# Patient Record
Sex: Female | Born: 1995 | Hispanic: Refuse to answer | Marital: Single
Health system: Midwestern US, Community
[De-identification: ages and names within clinical notes are randomized; demographics above are authoritative.]

## PROBLEM LIST (undated history)

## (undated) DIAGNOSIS — E119 Type 2 diabetes mellitus without complications: Secondary | ICD-10-CM

## (undated) DIAGNOSIS — Z01818 Encounter for other preprocedural examination: Secondary | ICD-10-CM

## (undated) DIAGNOSIS — G8918 Other acute postprocedural pain: Secondary | ICD-10-CM

## (undated) DIAGNOSIS — I10 Essential (primary) hypertension: Secondary | ICD-10-CM

## (undated) DIAGNOSIS — J45909 Unspecified asthma, uncomplicated: Secondary | ICD-10-CM

## (undated) DIAGNOSIS — R519 Headache, unspecified: Secondary | ICD-10-CM

## (undated) DIAGNOSIS — E282 Polycystic ovarian syndrome: Secondary | ICD-10-CM

## (undated) DIAGNOSIS — E669 Obesity, unspecified: Secondary | ICD-10-CM

## (undated) DIAGNOSIS — F32A Depression, unspecified: Secondary | ICD-10-CM

## (undated) DIAGNOSIS — N390 Urinary tract infection, site not specified: Secondary | ICD-10-CM

## (undated) DIAGNOSIS — R51 Headache: Secondary | ICD-10-CM

## (undated) DIAGNOSIS — Z3490 Encounter for supervision of normal pregnancy, unspecified, unspecified trimester: Secondary | ICD-10-CM

## (undated) HISTORY — PX: TYMPANOSTOMY TUBE PLACEMENT: SHX32

## (undated) HISTORY — PX: OTHER SURGICAL HISTORY: SHX169

## (undated) HISTORY — PX: ABDOMINAL EXPLORATION SURGERY: SHX538

## (undated) HISTORY — DX: Polycystic ovarian syndrome: E28.2

## (undated) HISTORY — DX: Type 2 diabetes mellitus without complications: E11.9

## (undated) HISTORY — PX: LAPAROSCOPIC GASTRIC SLEEVE RESECTION: SHX5895

---
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
## 2018-01-05 ENCOUNTER — Ambulatory Visit
Admit: 2018-01-05 | Discharge: 2018-01-05 | Payer: PRIVATE HEALTH INSURANCE | Attending: Specialist | Primary: Family Medicine

## 2018-01-05 ENCOUNTER — Inpatient Hospital Stay: Admit: 2018-01-05 | Primary: Family Medicine

## 2018-01-05 ENCOUNTER — Encounter

## 2018-01-05 LAB — LABCORP SPECIMEN COLLECTION

## 2018-01-05 NOTE — Progress Notes (Signed)
Please call patient and let her know preoperative screening labs looked normal. Her HgA1c was 6.9%, which means that on average her blood sugar is running about 151. She may want to follow-up with her primary care doctor about medication adjustment. She is not anemic, has normal thyroid, kidney and liver function.  Thank you.

## 2018-01-05 NOTE — Patient Instructions (Addendum)
Body Mass Index: Care Instructions  Your Care Instructions    Body mass index (BMI) can help you see if your weight is raising your risk for health problems. It uses a formula to compare how much you weigh with how tall you are.  ?? A BMI lower than 18.5 is considered underweight.  ?? A BMI between 18.5 and 24.9 is considered healthy.  ?? A BMI between 25 and 29.9 is considered overweight. A BMI of 30 or higher is considered obese.  If your BMI is in the normal range, it means that you have a lower risk for weight-related health problems. If your BMI is in the overweight or obese range, you may be at increased risk for weight-related health problems, such as high blood pressure, heart disease, stroke, arthritis or joint pain, and diabetes. If your BMI is in the underweight range, you may be at increased risk for health problems such as fatigue, lower protection (immunity) against illness, muscle loss, bone loss, hair loss, and hormone problems.  BMI is just one measure of your risk for weight-related health problems. You may be at higher risk for health problems if you are not active, you eat an unhealthy diet, or you drink too much alcohol or use tobacco products.  Follow-up care is a key part of your treatment and safety. Be sure to make and go to all appointments, and call your doctor if you are having problems. It's also a good idea to know your test results and keep a list of the medicines you take.  How can you care for yourself at home?  ?? Practice healthy eating habits. This includes eating plenty of fruits, vegetables, whole grains, lean protein, and low-fat dairy.  ?? If your doctor recommends it, get more exercise. Walking is a good choice. Bit by bit, increase the amount you walk every day. Try for at least 30 minutes on most days of the week.  ?? Do not smoke. Smoking can increase your risk for health problems. If you need help quitting, talk to your doctor about stop-smoking programs and  medicines. These can increase your chances of quitting for good.  ?? Limit alcohol to 2 drinks a day for men and 1 drink a day for women. Too much alcohol can cause health problems.  If you have a BMI higher than 25  ?? Your doctor may do other tests to check your risk for weight-related health problems. This may include measuring the distance around your waist. A waist measurement of more than 40 inches in men or 35 inches in women can increase the risk of weight-related health problems.  ?? Talk with your doctor about steps you can take to stay healthy or improve your health. You may need to make lifestyle changes to lose weight and stay healthy, such as changing your diet and getting regular exercise.  If you have a BMI lower than 18.5  ?? Your doctor may do other tests to check your risk for health problems.  ?? Talk with your doctor about steps you can take to stay healthy or improve your health. You may need to make lifestyle changes to gain or maintain weight and stay healthy, such as getting more healthy foods in your diet and doing exercises to build muscle.  Where can you learn more?  Go to http://www.healthwise.net/[employer].  Enter S176 in the search box to learn more about "Body Mass Index: Care Instructions."  Current as of: February 17, 2017  Content Version: 11.9  ??   2006-2018 [employer]. Care instructions adapted under license by [employer] (which disclaims liability or warranty for this information). If you have questions about a medical condition or this instruction, always ask your healthcare professional. [employer]wise[employer] Inc[employer] disclaims any warranty or liability for your use of this information.    New patient Instructions    1. Ensure all pre-operative insurances requirements are complete (ie; dietary visits, psychology consults, primary care documentation, etc)    2. Adhere to pre-operative weight loss / weight maintenance plan discussed in the office today.     3. Contact the office with any questions on pre-operative clearance issues (ie; cardiology work-up, pulmonary work-up, upper GI study, etc).    4. If a barium upper GI study has been ordered for your evaluation, make sure you are on liquids only the morning of the procedure.

## 2018-01-05 NOTE — Progress Notes (Signed)
Bariatric Surgery Consultation    Subjective:     The patient is a 22 y.o. obese female with a Body mass index is 41.22 kg/m??..  The patient is at her heaviest weight for the past several years.  she has been overweight since since childhood.   she has been considering surgery since past few months.  she desires surgery at this time because of multiple health concerns and their lifestyle issues which are hindered by their weight. she has been referred by his family physician Dr Allen Kell for evaluation and treatment of their obesity via surgical intervention. Alexandria Vasquez has tried multiple diets in her lifetime most recently tried behavior modification and unsupervised diets    Bariatric comorbidities present are   Patient Active Problem List   Diagnosis Code   ??? Obesity, morbid (HCC) E66.01   ??? Hypertension I10   ??? Diabetes (HCC) E11.9   ??? Morbid obesity with BMI of 40.0-44.9, adult (HCC) E66.01, Z68.41   ??? 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 The patient  now presents  for consideration for weight loss surgery understanding the benefits of this over a medical approach of weight loss as was discussed in our presentation on weight loss surgery. They have discussed their plans both with their family and primary care physician who is in support of their pursuit of such. The patient has not had health issues as of late and denies and gastrointestinal disturbances other than what is outlined below in their review of symptoms. All of their prior evaluations available by both their PCP's and specialists physicians have been reviewed today either in the Care Everywhere portal or scanned under the media tab.    I have spent a large portion of my initial consultation today reviewing  the patients current dietary habits which have contributed to their health issues and obesity.  I have suggested to them personally a dietary regimen that they can initiate now to help with their status as it pertains to their weight.  They understand that the most important aspect of their journey through their weight loss endeavor will be their adherence to a new lifestyle of healthy eating behavior. They also understand that an adherence to an exercise program will not only help with weight loss but is ultimately important in weight maintenance.    The patients goal weight is 178 lb.   These goals are consistent with expected outcomes of their desired operation.  her Medical goals are resolution of these health issues.        Patient Active Problem List    Diagnosis Date Noted   ??? Obesity, morbid (HCC) 01/05/2018   ??? Morbid obesity with BMI of 40.0-44.9, adult (HCC) 01/05/2018   ??? Hypertension    ??? Diabetes (HCC)    ??? Sleep disorder breathing      No past surgical history on file.   Social History     Tobacco Use   ??? Smoking status: Former Smoker     Packs/day: 0.10     Types: Cigarettes     Start date: 12/24/2016     Last attempt to quit: 06/19/2017     Years since quitting: 0.5   ??? Smokeless tobacco: Never Used   Substance Use Topics   ??? Alcohol use:  Never     Frequency: Never      Family History   Problem Relation Age of Onset   ??? Hypertension Mother    ??? Diabetes Mother    ??? Hypertension Maternal Grandmother    ??? Diabetes Maternal Grandmother    ??? Hypertension Maternal Grandfather    ??? Diabetes Maternal Grandfather       Current Outpatient Medications   Medication Sig Dispense Refill   ??? glimepiride (AMARYL) 2 mg tablet   3   ??? losartan (COZAAR) 25 mg tablet      ??? metFORMIN (GLUCOPHAGE) 500 mg tablet Take 500 mg by mouth two (2) times daily (with meals).     ??? etonogestrel (NEXPLANON) 68 mg impl by SubDERmal route.     ??? vitamin e (E GEMS) 100 unit capsule Take  by mouth daily.       No Known Allergies        Review of Systems:       General - No history or complaints of unexpected fever, chills, or weight loss  Head/Neck - No history or complaints of headache, diplopia, dysphagia, hearing loss  Cardiac - No history or complaints of chest pain, palpitations, murmur, or shortness of breath  Pulmonary - No history or complaints of shortness of breath, productive cough, hemoptysis  Gastrointestinal - rare reflux,no  abdominal pain, obstipation/constipation or blood per rectum  Genitourinary - No history or complaints of hematuria/dysuria, stress urinary incontinence symptoms, or renal lithiasis  Musculoskeletal - no joint pain,  no muscular weakness  Hematologic - No history or complaints of bleeding disorders,  No blood transfusions  Neurologic - No history or complaints of  migraine headaches, seizure activity, syncopal episodes, TIA or stroke  Integumentary - No history or complaints of rashes, abnormal nevi, skin cancer  Gynecological - Does not have periods since having Nexplanon implanted               Objective:     Visit Vitals  BP 141/79 (BP 1 Location: Right arm, BP Patient Position: Sitting)   Pulse 77   Temp 98 ??F (36.7 ??C)   Resp 16   Ht  (1.702 m)   Wt 119.4 kg (263 lb 3.2 oz)   SpO2 98%   BMI 41.22 kg/m??       Physical Examination: General appearance - alert, well appearing, and in no distress and oriented to person, place, and time  Mental status - alert, oriented to person, place, and time, normal mood, behavior, speech, dress, motor activity, and thought processes  Eyes - pupils equal and reactive, extraocular eye movements intact, sclera anicteric, left eye normal, right eye normal  Ears - right ear normal, left ear normal  Nose - normal and patent, no erythema, discharge or polyps and normal nontender sinuses  Mouth - mucous membranes moist, pharynx normal without lesions  Neck - supple, no significant adenopathy  Lymphatics - no palpable lymphadenopathy, no hepatosplenomegaly   Chest - clear to auscultation, no wheezes, rales or rhonchi, symmetric air entry  Heart - normal rate, regular rhythm, normal S1, S2, no murmurs, rubs, clicks or gallops  Abdomen - soft, nontender, nondistended, no masses or organomegaly  Back exam - full range of motion, no tenderness, palpable spasm or pain on motion  Neurological - alert, oriented, normal speech, no focal findings or movement disorder noted  Musculoskeletal - no joint tenderness, deformity or swelling  Extremities - peripheral pulses normal, no pedal edema, no clubbing  or cyanosis  Skin - normal coloration and turgor, no rashes, no suspicious skin lesions noted    Labs:       No results found for this or any previous visit (from the past 1440 hour(s)).    Assessment:     Morbid obesity with comorbidity    Plan:     laparoscopic sleeve gastrectomy    This is a 22 y.o. female with a BMI of Body mass index is 41.22 kg/m??. and the weight-related co-morbidties as noted below. Alexandria Vasquez meets the NIH criteria for bariatric surgery based upon the BMI of Body mass index is 41.22 kg/m??. and multiple weight-related co-morbidties. Alexandria Vasquez has elected laparoscopic sleeve gastrectomy as her intervention of choice for treatment of morbid obestiy through surgical means secondary to its safety profile, rapid return to work  and decreases in operative risks over gastric bypass.    In the office today, following Alexandria Vasquez's history and physical examination, a 30 minute discussion regarding the anatomic alterations for the laparoscopic sleeve gastrectomy was undertaken. The dietary expectations and the patient and physician dependent factors for success were thoroughly discussed, to include the need for interval follow-up and long-term dietary changes associated with success. The possible complications of the sleeve gastrectomy  were also discussed, to include;death, DVT/PE, staple line leak, bleeding, stricture formation,  infection, nutritional deficiencies and sleeve dilation.  Specific weight related outcomes for success were also discussed with an emphasis on careful and close follow-up with the first year and eating behavior modification as the baseline and cyclical hunger return.  The patient expressed an understanding of the above factors, and her questions were answered in their entirety.    In addition, the patient attended a 1.5 hour power point seminar regarding obesity, surgical weight loss including, adjustable gastric band, gastric bypass, and sleeve gastrectomy.  This discussion contrasted the different surgical techniques, mechanisms of actions and expected outcomes, and surgical and medical risks associated with each procedure.  During this seminar, there was a long question and answer session where each questions was answered until there were no additional questions.     Today, the patient had all of her questions answered and desires to proceed with  bariatric surgery initially choosing sleeve gastrectomy as her surgical option.    Secondary Diagnoses:     Dietary Intervention  - The patient is currently scheduled to see or has been followed by a bariatric nutritionist for an attempt at preoperative weight loss as has been dictated by their insurance carrier.  They will be assessed at various times during their follow up to evaluate their progress depending on the length of time that is required once again by their carrier.  I have explained the importance of preoperative weight loss and the benefits regarding lower surgical risk and also assisting the patient in reaching their weight loss goal.  Finally they understand there is a physiologic benefit from the standpoint of hepatic volume reduction and reduction of central visceral adiposity preoperatively.  I have reiterated the importance of a low carbohydrate and high protein regimen to achieve their stated goal. I have reviewed their current eating  behavior prior to this encounter and explained to them in an exhaustive fashion the appropriate diet that they should adhere to. They have been encouraged to loose weight pre operatively and understand it is our prerogative to cancel surgery or postpone their procedure in the event of significant weight gain. The patients weight loss goal pre operatively is 5-10 pounds.  Hypertension - The patient has a clear understanding of how weight loss improves hypertension as a whole, but also they understand that there is a significant genetic component to this disease process. We will monitor the patient???s blood pressure while in the hospital and the plan would be to continue those medications postoperatively.?? If a diuretic is being used we will stop them on discharge to prevent dehydration particularly with the sleeve gastrectomy and the gastric bypass procedures.?? They will be instructed to monitor their blood pressure postoperatively while at home and notify their primary care physician in the event of any significantly high or uncharacteristic readings.    Adult Onset Diabetes - The patient has ben given a very low carbohydrate diet preoperatively along with instructions to monitor their blood sugars on a regular daily basis. When?? their surgery is performed?? we will be monitoring the patient with sliding scale insulin and accuchecks.?? Based on those values we will determine whether the patient needs a reduction of those medications postoperatively or total removal of those medications on discharge.?? We will have the patient continue accuchecks postoperatively while at home also and report to me or their family physician for appropriate adjustments as needed.?? The patient also understands that in the event of uncontrolled blood sugar preoperatively that we may choose to postpone their surgery.        Signed By: Crissie Reese, MD     Jan 05, 2018

## 2018-01-06 LAB — METABOLIC PANEL, COMPREHENSIVE
A-G Ratio: 1.5 (ref 1.2–2.2)
A-G Ratio: 1.5 (ref 1.2–2.2)
ALT (SGPT): 28 IU/L (ref 0–32)
ALT (SGPT): 28 IU/L (ref 0–32)
AST (SGOT): 15 IU/L (ref 0–40)
AST (SGOT): 15 IU/L (ref 0–40)
Albumin: 4.1 g/dL (ref 3.5–5.5)
Albumin: 4.1 g/dL (ref 3.5–5.5)
Alk. phosphatase: 52 IU/L (ref 39–117)
Alk. phosphatase: 52 IU/L (ref 39–117)
BUN/Creatinine ratio: 13 (ref 9–23)
BUN/Creatinine ratio: 13 (ref 9–23)
BUN: 7 mg/dL (ref 6–20)
BUN: 7 mg/dL (ref 6–20)
Bilirubin, total: 0.6 mg/dL (ref 0.0–1.2)
Bilirubin, total: 0.6 mg/dL (ref 0.0–1.2)
CO2: 23 mmol/L (ref 20–29)
CO2: 23 mmol/L (ref 20–29)
Calcium: 9.5 mg/dL (ref 8.7–10.2)
Calcium: 9.5 mg/dL (ref 8.7–10.2)
Chloride: 100 mmol/L (ref 96–106)
Chloride: 100 mmol/L (ref 96–106)
Creatinine: 0.53 mg/dL — ABNORMAL LOW (ref 0.57–1.00)
Creatinine: 0.53 mg/dL — ABNORMAL LOW (ref 0.57–1.00)
GFR est AA: 158 mL/min/{1.73_m2} (ref >59)
GFR est AA: 158 mL/min/{1.73_m2} (ref >59)
GFR est non-AA: 137 mL/min/{1.73_m2} (ref >59)
GFR est non-AA: 137 mL/min/{1.73_m2} (ref >59)
GLOBULIN, TOTAL: 2.8 g/dL (ref 1.5–4.5)
GLOBULIN, TOTAL: 2.8 g/dL (ref 1.5–4.5)
Glucose: 129 mg/dL — ABNORMAL HIGH (ref 65–99)
Glucose: 129 mg/dL — ABNORMAL HIGH (ref 65–99)
Potassium: 4.6 mmol/L (ref 3.5–5.2)
Potassium: 4.6 mmol/L (ref 3.5–5.2)
Protein, total: 6.9 g/dL (ref 6.0–8.5)
Protein, total: 6.9 g/dL (ref 6.0–8.5)
Sodium: 139 mmol/L (ref 134–144)
Sodium: 139 mmol/L (ref 134–144)

## 2018-01-06 LAB — TSH 3RD GENERATION
TSH: 0.87 u[IU]/mL (ref 0.450–4.500)
TSH: 0.87 u[IU]/mL (ref 0.450–4.500)

## 2018-01-06 LAB — CBC W/O DIFF
HCT: 37.8 % (ref 34.0–46.6)
HCT: 37.8 % (ref 34.0–46.6)
HGB: 13 g/dL (ref 11.1–15.9)
HGB: 13 g/dL (ref 11.1–15.9)
MCH: 29.1 pg (ref 26.6–33.0)
MCH: 29.1 pg (ref 26.6–33.0)
MCHC: 34.4 g/dL (ref 31.5–35.7)
MCHC: 34.4 g/dL (ref 31.5–35.7)
MCV: 85 fL (ref 79–97)
MCV: 85 fL (ref 79–97)
PLATELET: 327 10*3/uL (ref 150–379)
PLATELET: 327 10*3/uL (ref 150–379)
RBC: 4.47 x10E6/uL (ref 3.77–5.28)
RBC: 4.47 x10E6/uL (ref 3.77–5.28)
RDW: 14.5 % (ref 12.3–15.4)
RDW: 14.5 % (ref 12.3–15.4)
WBC: 10.6 10*3/uL (ref 3.4–10.8)
WBC: 10.6 10*3/uL (ref 3.4–10.8)

## 2018-01-06 LAB — HEMOGLOBIN A1C W/O EAG
Hemoglobin A1c: 6.9 % — ABNORMAL HIGH (ref 4.8–5.6)
Hemoglobin A1c: 6.9 % — ABNORMAL HIGH (ref 4.8–5.6)

## 2018-02-03 ENCOUNTER — Encounter

## 2018-02-04 ENCOUNTER — Inpatient Hospital Stay: Payer: MEDICAID

## 2018-02-04 ENCOUNTER — Institutional Professional Consult (permissible substitution): Admit: 2018-02-04 | Discharge: 2018-02-04 | Payer: PRIVATE HEALTH INSURANCE | Primary: Family Medicine

## 2018-02-04 ENCOUNTER — Ambulatory Visit: Admit: 2018-02-04 | Payer: MEDICAID | Primary: Family Medicine

## 2018-02-04 MED ORDER — BARIUM SULFATE 60 % (W/V) ORAL SUSP
60 % (w/v) | ORAL | Status: DC | PRN
Start: 2018-02-04 — End: 2018-02-04
  Administered 2018-02-04: 19:00:00 via ORAL

## 2018-02-04 NOTE — Progress Notes (Signed)
Medical Weight Loss Multi-Disciplinary Program    Name: Alexandria Vasquez   DOB: 04-29-1996    Session# 1  Date: 02/04/2018     Height: 5\' 7"  (170.2 cm)    Weight: 115.7 kg (255 lb) lbs.  Body mass index is 39.94 kg/m??.   Pounds Lost: 8 lbs Pounds Gained:       Behavior Modification    Positive attitude  Comments: Pt is working on the following goals:    Dietitian: Brynda RimKirby E Moir     Alexandria Vasquez is a 22 y.o. female who present for a pre-op evaluation.    Visit Vitals  Ht 5\' 7"  (1.702 m)   Wt 115.7 kg (255 lb)   BMI 39.94 kg/m??     Past Medical History:   Diagnosis Date   ??? Diabetes (HCC)    ??? Hypertension    ??? Sleep disorder breathing            Procedure:  laparoscopic sleeve gastrectomy     Reasons for Surgery:  NIDDM and HTN    Summary:  Pt given brief pre/post-op diet ed and diet hx reviewed. Pt instructed to follow a low calorie, low carbohydrate, high protein diet of about 1200-1500 calories daily. Pt set several goals. See below.     What have you done in the past to try to lose weight? Calorie controlled diet, exercise, dietitian visits, nutrition education classes    Why didn't you lose weight or keep the weight off?: Patient has tried to lose weight in the past, but reports minimal success even when attending nutrition classes    Why do you think having weight loss surgery will make it possible for you to lose weight and keep it off? Patient wants to prevent complications of HTN and diabetes, she want to enjoy her youth and live a health lifestyle.     Dietary Instructions    Nutritional Hx:  What is the number of meals you eat per day? 2-3  Comment:     Do you eat between meals / snack? yes  Typical snack: chips    How fast do you eat your meals? rapid    How often do you eat fast food? 7 times a week -chinese OR fast food, eating that lunch at school/work    How many sodas/sugared beverages do you drink per day? Regular soda - 2-3 ( 16 oz soda) OR Green tea with     How many caffeinated drinks do you have per day? Green tea, soda     How much milk and/or juice do you drink per day? OJ - 8 oz per day, Milk with cereal     How much water do you drink per day? 2-3 (8oz glasses)    How often do you consume alcohol? occasionally;     Current Vitamins: none   Diet History:    Typical intake is as follows:  Breakfast: bag of chips with 4-6 oz OJ OR 2 eggs with bacon OR cream of wheat OR oatmeal  Lunch: [place] takeout- General Tso chicken with pork fried rice and egg roll and teriyaki stick   Dinner: Steak, potato, with collard greens   Snacks: Chips   Fluids: 48 oz Regular soda, juice, green tea 16 oz water      Reviewed intake  Understanding low carbohydrates, low sugar, higher protein meals  Understanding proper portions  Dining outside home  Instruction given for personal dietary changes  Discussed perceived compliance  Comments: Pt  given brief pre/post-op diet ed and diet hx reviewed.     Patient Education and Materials Provided:  Supplement [employer], B Vitamin Information, MVI Recommendations, Calcium Citrate Information, Bariatric Supplement Companies, Protein Supplement Information, Fluid Requirements, No Caffeine or Carbonation, No Alcohol for One Year Post Op, 3 Balanced Meals a Day, Food Group Guide, Good Choices Dining Out, No Snacks, No Concentrated Sweets, Support System at Home, Exercising, Support Group Information and Addressed Current Habits / Changes to make  Physical Activity/Exercise    Discussed Perceived Compliance  Reasonable Goals Set  Motivation  Comments: none    Exercise:  Do you currently have an exercise routine? no and reinforced importance of activity for total health and long term weight maintenance.      Goals:   1. Work to increase to 3 meals per day, with planned snacks as needed.  Recommend following plate method for meal planning - focusing on lean protein, non-starchy vegetables, and measured amounts of starch.    2. - Goal of 80-100 g protein and 50-75 g carbohydrate per day.  3. ??  2. Increase non caloric fluid to 64 oz per day.  Eliminate caffeine, added sugar, carbonation, and straws.   ??  3. Start complete MVI  ??  4. Work to increase timed activity goal of 150 minutes per week.    Candidate for surgery (per RD):PENDING  Jamey Reas, RD  02/04/18

## 2018-02-04 NOTE — Procedures (Signed)
Patient:Alexandria Vasquez   DOB: 08/16/1996  Medical Record Number:[id number]            PREPROCEDURE DIAGNOSIS: This patient is preoperative for laparoscopic sleeve gastrectomyprocedure with a history of  reflux disease.    POSTPROCEDURE DIAGNOSIS: This patient is preoperative for laparoscopic sleeve gastrectomyprocedure with a history of  reflux disease.      PROCEDURES PERFORMED: Upper GI study with barium.    ESTIMATED BLOOD LOSS: None.    SPECIMENS: None.    STATEMENT OF MEDICAL NECESSITY: The patient is a patient with a  longstanding history of obesity. They are now considering the laparoscopic sleeve gastrectomyprocedure as a means of surgical weight control and due to their history of reflux disease and are being assessed preoperatively for such.    DESCRIPTION OF PROCEDURE: The patient was brought to the fluoroscopy unit and  was given thin barium. On swallowing of barium, they were noted to have  normal peristalsis of their esophagus. They had prompt filling of distal  esophagus with tapering into the gastroesophageal junction. There was no evidence of a hiatal hernia present. Contrast then filled the gastric cardia, fundus,body and pre pyloric region with no abnormalities noted. Contrast then exited the pylorus in normal fashion. No obstruction was noted. There was no evidence of reflux noted.    (normal anatomy)    Anthony Terracina, MD

## 2018-02-04 NOTE — Progress Notes (Signed)
 Medical Weight Loss Multi-Disciplinary Program    Name: Alexandria Vasquez   DOB: 12-Apr-1996    Session# 1  Date: 02/04/2018     Height: 5' 7 (170.2 cm)    Weight: 115.7 kg (255 lb) lbs.  Body mass index is 39.94 kg/m.   Pounds Lost: 8 lbs Pounds Gained:       Behavior Modification    Positive attitude  Comments: Pt is working on the following goals:    Dietitian: Kirby E Moir     Alexandria Vasquez is a 22 y.o. female who present for a pre-op evaluation.    Visit Vitals  Ht 5' 7 (1.702 m)   Wt 115.7 kg (255 lb)   BMI 39.94 kg/m     Past Medical History:   Diagnosis Date   . Diabetes (HCC)    . Hypertension    . Sleep disorder breathing            Procedure:  laparoscopic sleeve gastrectomy     Reasons for Surgery:  NIDDM and HTN    Summary:  Pt given brief pre/post-op diet ed and diet hx reviewed. Pt instructed to follow a low calorie, low carbohydrate, high protein diet of about 1200-1500 calories daily. Pt set several goals. See below.     What have you done in the past to try to lose weight? Calorie controlled diet, exercise, dietitian visits, nutrition education classes    Why didn't you lose weight or keep the weight off?: Patient has tried to lose weight in the past, but reports minimal success even when attending nutrition classes    Why do you think having weight loss surgery will make it possible for you to lose weight and keep it off? Patient wants to prevent complications of HTN and diabetes, she want to enjoy her youth and live a health lifestyle.     Dietary Instructions    Nutritional Hx:  What is the number of meals you eat per day? 2-3  Comment:     Do you eat between meals / snack? yes  Typical snack: chips    How fast do you eat your meals? rapid    How often do you eat fast food? 7 times a week -chinese OR fast food, eating that lunch at school/work    How many sodas/sugared beverages do you drink per day? Regular soda - 2-3 ( 16 oz soda) OR Green tea with    How many caffeinated drinks do you have per  day? Green tea, soda     How much milk and/or juice do you drink per day? OJ - 8 oz per day, Milk with cereal     How much water do you drink per day? 2-3 (8oz glasses)    How often do you consume alcohol? occasionally;     Current Vitamins: none     Diet History:    Typical intake is as follows:  Breakfast: bag of chips with 4-6 oz OJ OR 2 eggs with bacon OR cream of wheat OR oatmeal  Lunch: [place] takeout- General Tso chicken with pork fried rice and egg roll and teriyaki stick   Dinner: Steak, potato, with collard greens   Snacks: Chips   Fluids: 48 oz Regular soda, juice, green tea 16 oz water      Reviewed intake  Understanding low carbohydrates, low sugar, higher protein meals  Understanding proper portions  Dining outside home  Instruction given for personal dietary changes  Discussed perceived compliance  Comments: Pt given brief pre/post-op diet ed and diet hx reviewed.     Patient Education and Materials Provided:  Supplement [employer], B Vitamin Information, MVI Recommendations, Calcium Citrate Information, Bariatric Supplement Companies, Protein Supplement Information, Fluid Requirements, No Caffeine or Carbonation, No Alcohol for One Year Post Op, 3 Balanced Meals a Day, Food Group Guide, Good Choices Dining Out, No Snacks, No Concentrated Sweets, Support System at Home, Exercising, Support Group Information and Addressed Current Habits / Changes to make  Physical Activity/Exercise    Discussed Perceived Compliance  Reasonable Goals Set  Motivation  Comments: none    Exercise:  Do you currently have an exercise routine? no and reinforced importance of activity for total health and long term weight maintenance.      Goals:   1. Work to increase to 3 meals per day, with planned snacks as needed.  Recommend following plate method for meal planning - focusing on lean protein, non-starchy vegetables, and measured amounts of starch.   2. - Goal of 80-100 g protein and 50-75 g carbohydrate per  day.  3.   2. Increase non caloric fluid to 64 oz per day.  Eliminate caffeine, added sugar, carbonation, and straws.     3. Start complete MVI    4. Work to increase timed activity goal of 150 minutes per week.    Candidate for surgery (per RD):PENDING  Marlo CHARLENA Cabot, RD  02/04/18

## 2018-02-04 NOTE — Procedures (Signed)
Patient:Alexandria Vasquez   DOB: [birth year]  Medical Record [id number]            PREPROCEDURE DIAGNOSIS: This patient is preoperative for laparoscopic sleeve gastrectomyprocedure with a history of  reflux disease.    POSTPROCEDURE DIAGNOSIS: This patient is preoperative for laparoscopic sleeve gastrectomyprocedure with a history of  reflux disease.      PROCEDURES PERFORMED: Upper GI study with barium.    ESTIMATED BLOOD LOSS: None.    SPECIMENS: None.    STATEMENT OF MEDICAL NECESSITY: The patient is a patient with a  longstanding history of obesity. They are now considering the laparoscopic sleeve gastrectomyprocedure as a means of surgical weight control and due to their history of reflux disease and are being assessed preoperatively for such.    DESCRIPTION OF PROCEDURE: The patient was brought to the fluoroscopy unit and  was given thin barium. On swallowing of barium, they were noted to have  normal peristalsis of their esophagus. They had prompt filling of distal  esophagus with tapering into the gastroesophageal junction. There was no evidence of a hiatal hernia present. Contrast then filled the gastric cardia, fundus,body and pre pyloric region with no abnormalities noted. Contrast then exited the pylorus in normal fashion. No obstruction was noted. There was no evidence of reflux noted.    (normal anatomy)    Angelique HolmAnthony Terracina, MD

## 2018-03-05 ENCOUNTER — Encounter: Primary: Family Medicine

## 2018-03-12 ENCOUNTER — Encounter: Primary: Family Medicine

## 2018-03-12 ENCOUNTER — Institutional Professional Consult (permissible substitution): Admit: 2018-03-12 | Discharge: 2018-03-12 | Payer: PRIVATE HEALTH INSURANCE | Primary: Family Medicine

## 2018-03-12 NOTE — Progress Notes (Signed)
Medical Weight Loss Multi-Disciplinary Program    Name: Alexandria Vasquez   DOB: 04-Dec-1995    Session# 2  Date: 03/12/2018     Height: 5\' 7"  (170.2 cm)    Weight: 113.9 kg (251 lb) lbs.  Body mass index is 39.31 kg/m??.   Pounds Lost: 4 lbs Pounds Gained: 0    Dietary Instructions    Reviewed intake  Understanding low carbohydrates, low sugar, higher protein meals  Understanding proper portions  Dining outside home  Instruction given for personal dietary changes  Discussed perceived compliance  Comments: Today the majority of our visit was spent reviewing patient's food recall and identifying areas for improvement.  Educated  on the importance of eating 3 meals/day at regularly scheduled times including breakfast within 1st 1-2 hours of waking.  Suggested patient use a protein supplement as a meal replacement instead of skipping OR as a between meal high protein snack.  Also educated on the importance of including a protein with every meal and snack and reviewed lean sources.  Lastly introduced  the Plate Method for Meal Planning and used food models to assist with visualizing recommended serving sizes.        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 Work to increase to 3 meals per day, with planned snacks as needed. ??Recommend following plate method for meal planning - focusing on lean protein, non-starchy vegetables, and measured amounts of starch.   - Goal of 80-100 g protein and??50-75??g carbohydrate per day.   - Recommend patient??try protein supplement in place of meal skipping  - Add lean protein with all meals and work to plan meals ahead of time  2.??Increase non caloric fluid to 64 oz per day. ??Eliminate caffeine, added sugar, carbonation, and straws.    - Limit juice to 4 oz per day   -No carbonation following surgery, work to decrease   ??  3. Start complete MVI  ??  4. Work to increase timed activity goal of 150 minutes per week.    Candidate for surgery (per RD): PENDING     Dietitian: Jamey ReasKirby E Moir

## 2018-03-12 NOTE — Progress Notes (Signed)
 Medical Weight Loss Multi-Disciplinary Program    Name: Alexandria Vasquez   DOB: May 18, 1996    Session# 2  Date: 03/12/2018     Height: 5' 7 (170.2 cm)    Weight: 113.9 kg (251 lb) lbs.  Body mass index is 39.31 kg/m.   Pounds Lost: 4 lbs Pounds Gained: 0    Dietary Instructions    Reviewed intake  Understanding low carbohydrates, low sugar, higher protein meals  Understanding proper portions  Dining outside home  Instruction given for personal dietary changes  Discussed perceived compliance  Comments: Today the majority of our visit was spent reviewing patient's food recall and identifying areas for improvement.  Educated  on the importance of eating 3 meals/day at regularly scheduled times including breakfast within 1st 1-2 hours of waking.  Suggested patient use a protein supplement as a meal replacement instead of skipping OR as a between meal high protein snack.  Also educated on the importance of including a protein with every meal and snack and reviewed lean sources.  Lastly introduced  the Plate Method for Meal Planning and used food models to assist with visualizing recommended serving sizes.        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 Work to increase to 3 meals per day, with planned snacks as needed. Recommend following plate method for meal planning - focusing on lean protein, non-starchy vegetables, and measured amounts of starch.   - Goal of 80-100 g protein and50-75g carbohydrate per day.  - Recommend  patienttry protein supplement in place of meal skipping  - Add lean protein with all meals and work to plan meals ahead of time  2.Increase non caloric fluid to 64 oz per day. Eliminate caffeine, added sugar, carbonation, and straws.    - Limit juice to 4 oz per day   -No carbonation following surgery, work to decrease     3. Start complete MVI    4. Work to increase timed activity goal of 150 minutes per week.    Candidate for surgery (per RD): PENDING     Dietitian: Marlo FORBES Cabot

## 2018-04-02 ENCOUNTER — Encounter: Primary: Family Medicine

## 2018-04-16 ENCOUNTER — Institutional Professional Consult (permissible substitution): Admit: 2018-04-16 | Discharge: 2018-04-16 | Payer: PRIVATE HEALTH INSURANCE | Primary: Family Medicine

## 2018-04-16 NOTE — Progress Notes (Signed)
Medical Weight Loss Multi-Disciplinary Program    Name: Jennye Boroughslysha Battle   DOB: [birth year]    Session# 3  Date: 04/16/2018     Height: 5\' 7"  (170.2 cm)    Weight: 114.8 kg (253 lb) lbs.  Body mass index is 39.63 kg/m??.   Pounds Lost: 0 lbs Pounds Gained: 2 lbs     Patient recently underwent tonsillectomy, due to this she had decreased intake and has had to rely on more soft easy to swallow carbohydrate foods.     Dietary Instructions    Reviewed intake  Understanding low carbohydrates, low sugar, higher protein meals  Understanding proper portions  Dining outside home  Instruction given for personal dietary changes  Discussed perceived compliance  Comments:After reviewing patient's food records and identifying continued areas for improvement. Introduced the 3 gram rule for fat and sugar and patient demonstrated the ability to read nutrition labels.  Next introduced key diet principles following weight loss surgery and helped identify areas to continue making progress- started reviewed post -operative diet advancement for weeks 1-2 and required vitamins and minerals     Typical intake is as follows:Did food recall for changes made prior to recent tonsillectomy   Breakfast: 2 eggs with bacon OR [place] sausage    Lunch: SKIPS due to late breakfast   Dinner: 4 oz steak with peas and cooked cabbage OR Baked fish with salad greens OR broccoli and mixed peppers   Snacks: Protein bar, chips   Fluids: 48 oz water- reduced soda intake      Physical Activity/Exercise    Discussed Perceived Compliance  Motivation  Comments: Suggest patient start with walking 3-4 days per week for at least 30 minutes    Behavior Modification    Identify obstacles to trigger change  Discussed perceived compliance  Comments: none    1. Work to increase to 3 meals per day, with planned snacks as needed. ??Recommend following plate method for meal planning - focusing on lean protein, non-starchy vegetables, and measured amounts of starch.    - Goal of 80-100 g protein and??50-75??g carbohydrate per day.  - Recommend patient??try protein supplement in place of meal skipping  - Add lean protein with all meals and work to plan meals ahead of time  2.??Increase non caloric fluid to 64 oz per day. ??Eliminate caffeine, added sugar, carbonation, and straws.    - Limit juice to 4 oz per day   -No carbonation following surgery, work to decrease   ??  3. Start complete MVI  ??  4. Work to increase timed activity goal of 150 minutes per week.    Candidate for surgery (per RD): PENDING     Dietitian: Jamey ReasKirby E Moir

## 2018-04-16 NOTE — Progress Notes (Signed)
 Medical Weight Loss Multi-Disciplinary Program    Name: Suhailah Kwan   DOB: 1996-03-07    Session# 3  Date: 04/16/2018     Height: 5' 7 (170.2 cm)    Weight: 114.8 kg (253 lb) lbs.  Body mass index is 39.63 kg/m.   Pounds Lost: 0 lbs Pounds Gained: 2 lbs     Patient recently underwent tonsillectomy, due to this she had decreased intake and has had to rely on more soft easy to swallow carbohydrate foods.     Dietary Instructions    Reviewed intake  Understanding low carbohydrates, low sugar, higher protein meals  Understanding proper portions  Dining outside home  Instruction given for personal dietary changes  Discussed perceived compliance  Comments:After reviewing patient's food records and identifying continued areas for improvement. Introduced the 3 gram rule for fat and sugar and patient demonstrated the ability to read nutrition labels.  Next introduced key diet principles following weight loss surgery and helped identify areas to continue making progress- started reviewed post -operative diet advancement for weeks 1-2 and required vitamins and minerals     Typical intake is as follows:Did food recall for changes made prior to recent tonsillectomy   Breakfast: 2 eggs with bacon OR [place] sausage    Lunch: SKIPS due to late breakfast   Dinner: 4 oz steak with peas and cooked cabbage OR Baked fish with salad greens OR broccoli and mixed peppers   Snacks: Protein bar, chips   Fluids: 48 oz water- reduced soda intake      Physical Activity/Exercise    Discussed Perceived Compliance  Motivation  Comments: Suggest patient start with walking 3-4 days per week for at least 30 minutes    Behavior Modification    Identify obstacles to trigger change  Discussed perceived compliance  Comments: none    1. Work to increase to 3 meals per day, with planned snacks as needed. Recommend following plate method for meal planning - focusing on lean protein, non-starchy vegetables, and measured amounts of starch.   - Goal of  80-100 g protein and50-75g carbohydrate per day.  - Recommend patienttry protein supplement in place of meal skipping  - Add lean protein with all meals and work to plan meals ahead of time  2.Increase non caloric fluid to 64 oz per day. Eliminate caffeine, added sugar, carbonation, and straws.    - Limit juice to 4 oz per day   -No carbonation following surgery, work to decrease     3. Start complete MVI    4. Work to increase timed activity goal of 150 minutes per week.    Candidate for surgery (per RD): PENDING     Dietitian: Marlo FORBES Cabot

## 2018-05-13 ENCOUNTER — Institutional Professional Consult (permissible substitution): Admit: 2018-05-13 | Discharge: 2018-05-13 | Payer: PRIVATE HEALTH INSURANCE | Primary: Family Medicine

## 2018-05-13 DIAGNOSIS — Z6841 Body Mass Index (BMI) 40.0 and over, adult: Secondary | ICD-10-CM

## 2018-05-13 NOTE — Progress Notes (Signed)
Medical Weight Loss Multi-Disciplinary Program    Name: Jennye Boroughslysha Burningham   DOB: 04-03-1996    Session# 4  Date: 05/13/2018     Height: 5\' 7"  (170.2 cm)    Weight: 113.9 kg (251 lb) lbs.  Body mass index is 39.31 kg/m??.   Pounds Lost: 2 lbs Pounds Gained: 0 lbs     Dietary Instructions    Reviewed intake  Understanding low carbohydrates, low sugar, higher protein meals  Understanding proper portions  Dining outside home  Instruction given for personal dietary changes  Discussed perceived compliance  Comments: Assistetd with goal setting, specifically for planning options for breakfast and lunch. Patient continues to make progress toward goals but is finding it difficult to meet goals consistently. Reinforced key diet principles and carbohydrate counting.     Typical intake is as follows:  Breakfast: (8-9 am) Oatmeal (with water - 1 tsp sugar)  with bacon OR Oatmeal with sausage OR Eggs with bacon   Lunch: SKIPS due to late breakfast and lack of time at work - frequently grabbing chips  Dinner: 4 oz steak with peas and cooked cabbage OR Baked fish with salad greens OR broccoli and mixed peppers   Snacks: Protein bar, chips   Fluids: 48 oz water- reduced soda intake to 2 times or less per week    Physical Activity/Exercise    Discussed Perceived Compliance  Motivation  Comments: Suggest patient start with walking 3-4 days per week for at least 30 minutes    Behavior Modification    Identify obstacles to trigger change  Discussed perceived compliance  Comments: none    1. Work to increase to 3 meals per day, with planned snacks as needed. ??Recommend following plate method for meal planning - focusing on lean protein, non-starchy vegetables, and measured amounts of starch.   - Goal of 80-100 g protein and??50-75??g carbohydrate per day.  - Recommend patient??try protein supplement in place of meal skipping  - Add lean protein with all meals and work to plan meals ahead of time   2.??Increase non caloric fluid to 64 oz per day. ??Eliminate caffeine, added sugar, carbonation, and straws.    - Limit juice to 4 oz per day   -No carbonation following surgery, work to decrease   ??  3. Start complete MVI  ??  4. Work to increase timed activity goal of 150 minutes per week.    Candidate for surgery (per RD): PENDING     Dietitian: Jamey ReasKirby E Moir

## 2018-05-13 NOTE — Progress Notes (Signed)
 Medical Weight Loss Multi-Disciplinary Program    Name: Alexandria Vasquez   DOB: July 20, 1996    Session# 4  Date: 05/13/2018     Height: 5' 7 (170.2 cm)    Weight: 113.9 kg (251 lb) lbs.  Body mass index is 39.31 kg/m.   Pounds Lost: 2 lbs Pounds Gained: 0 lbs     Dietary Instructions    Reviewed intake  Understanding low carbohydrates, low sugar, higher protein meals  Understanding proper portions  Dining outside home  Instruction given for personal dietary changes  Discussed perceived compliance  Comments: Assistetd with goal setting, specifically for planning options for breakfast and lunch. Patient continues to make progress toward goals but is finding it difficult to meet goals consistently. Reinforced key diet principles and carbohydrate counting.     Typical intake is as follows:  Breakfast: (8-9 am) Oatmeal (with water - 1 tsp sugar)  with bacon OR Oatmeal with sausage OR Eggs with bacon   Lunch: SKIPS due to late breakfast and lack of time at work - frequently grabbing chips  Dinner: 4 oz steak with peas and cooked cabbage OR Baked fish with salad greens OR broccoli and mixed peppers   Snacks: Protein bar, chips   Fluids: 48 oz water- reduced soda intake to 2 times or less per week    Physical Activity/Exercise    Discussed Perceived Compliance  Motivation  Comments: Suggest patient start with walking 3-4 days per week for at least 30 minutes    Behavior Modification    Identify obstacles to trigger change  Discussed perceived compliance  Comments: none    1. Work to increase to 3 meals per day, with planned snacks as needed. Recommend following plate method for meal planning - focusing on lean protein, non-starchy vegetables, and measured amounts of starch.   - Goal of 80-100 g protein and50-75g carbohydrate per day.  - Recommend patienttry protein supplement in place of meal skipping  - Add lean protein with all meals and work to plan meals ahead of time  2.Increase non caloric fluid to 64 oz per day.  Eliminate caffeine, added sugar, carbonation, and straws.    - Limit juice to 4 oz per day   -No carbonation following surgery, work to decrease     3. Start complete MVI    4. Work to increase timed activity goal of 150 minutes per week.    Candidate for surgery (per RD): PENDING     Dietitian: Marlo FORBES Cabot

## 2018-06-18 ENCOUNTER — Institutional Professional Consult (permissible substitution): Admit: 2018-06-18 | Discharge: 2018-06-18 | Payer: PRIVATE HEALTH INSURANCE | Primary: Family Medicine

## 2018-06-18 NOTE — Progress Notes (Signed)
Medical Weight Loss Multi-Disciplinary Program    Name: Alexandria Vasquez   DOB: [birth year]    Session# 5  Date: 06/18/2018     Height: 5\' 7"  (170.2 cm)    Weight: 112.9 kg (249 lb) lbs.  Body mass index is 39 kg/m??.   Pounds Lost: 2 lbs Pounds Gained: 0 lbs     Consult weight 263 lbs, weight loss 5-10 lbs      Dietary Instructions    Reviewed intake  Understanding low carbohydrates, low sugar, higher protein meals  Understanding proper portions  Dining outside home  Instruction given for personal dietary changes  Discussed perceived compliance  Comments: Assistetd with goal setting, specifically for planning options for breakfast and lunch. Patient continues to make progress toward goals but is finding it difficult to meet goals consistently. Reinforced key diet principles and carbohydrate counting.  Reviewed diet progression guide with portions, reinforced importance of adequate protein and hydration. Discussed tips for increasing protein at midday meal     Typical intake is as follows:  Breakfast: (8-9 am) Tomasa Blase with eggs  Lunch: Protein shake OR Nab cracker packs   Dinner: Baked fish with salad greens OR broccoli and mixed peppers   Snacks: Protein bar,  Fluids: 64 oz water, 12 oz fresca     Physical Activity/Exercise    Discussed Perceived Compliance  Motivation  Comments: Patient has started walking 3-4 days per week for 30 minutes    Behavior Modification    Identify obstacles to trigger change  Discussed perceived compliance  Comments: none    1. Work to increase to 3 meals per day, with planned snacks as needed. ??Recommend following plate method for meal planning - focusing on lean protein, non-starchy vegetables, and measured amounts of starch.   - Goal of 80-100 g protein and??50-75??g carbohydrate per day.  - Recommend patient??try protein supplement in place of meal skipping  - Add lean protein with all meals and work to plan meals ahead of time   2.??Increase non caloric fluid to 64 oz per day. ??Eliminate caffeine, added sugar, carbonation, and straws.    - Limit juice to 4 oz per day   -No carbonation following surgery, work to decrease   ??  3. Start complete MVI  ??  4. Work to increase timed activity goal of 150 minutes per week.    Candidate for surgery (per RD): PENDING     Dietitian: Jamey Reas

## 2018-06-18 NOTE — Progress Notes (Signed)
 Medical Weight Loss Multi-Disciplinary Program    Name: Alexandria Vasquez   DOB: Jun 16, 1996    Session# 5  Date: 06/18/2018     Height: 5' 7 (170.2 cm)    Weight: 112.9 kg (249 lb) lbs.  Body mass index is 39 kg/m.   Pounds Lost: 2 lbs Pounds Gained: 0 lbs     Consult weight 263 lbs, weight loss 5-10 lbs      Dietary Instructions    Reviewed intake  Understanding low carbohydrates, low sugar, higher protein meals  Understanding proper portions  Dining outside home  Instruction given for personal dietary changes  Discussed perceived compliance  Comments: Assistetd with goal setting, specifically for planning options for breakfast and lunch. Patient continues to make progress toward goals but is finding it difficult to meet goals consistently. Reinforced key diet principles and carbohydrate counting.  Reviewed diet progression guide with portions, reinforced importance of adequate protein and hydration. Discussed tips for increasing protein at midday meal     Typical intake is as follows:  Breakfast: (8-9 am) [place] with eggs  Lunch: Protein shake OR Nab cracker packs   Dinner: Baked fish with salad greens OR broccoli and mixed peppers   Snacks: Protein bar,  Fluids: 64 oz water, 12 oz fresca     Physical Activity/Exercise    Discussed Perceived Compliance  Motivation  Comments: Patient has started walking 3-4 days per week for 30 minutes    Behavior Modification    Identify obstacles to trigger change  Discussed perceived compliance  Comments: none    1. Work to increase to 3 meals per day, with planned snacks as needed. Recommend following plate method for meal planning - focusing on lean protein, non-starchy vegetables, and measured amounts of starch.   - Goal of 80-100 g protein and50-75g carbohydrate per day.  - Recommend patienttry protein supplement in place of meal skipping  - Add lean protein with all meals and work to plan meals ahead of time  2.Increase non caloric fluid to 64 oz per day. Eliminate  caffeine, added sugar, carbonation, and straws.    - Limit juice to 4 oz per day   -No carbonation following surgery, work to decrease     3. Start complete MVI    4. Work to increase timed activity goal of 150 minutes per week.    Candidate for surgery (per RD): PENDING     Dietitian: Marlo FORBES Cabot

## 2018-07-15 ENCOUNTER — Ambulatory Visit: Attending: Specialist

## 2018-07-15 ENCOUNTER — Institutional Professional Consult (permissible substitution): Admit: 2018-07-15 | Discharge: 2018-07-15 | Payer: PRIVATE HEALTH INSURANCE | Primary: Family Medicine

## 2018-07-15 ENCOUNTER — Ambulatory Visit
Admit: 2018-07-15 | Discharge: 2018-07-15 | Payer: PRIVATE HEALTH INSURANCE | Attending: Specialist | Primary: Family Medicine

## 2018-07-15 DIAGNOSIS — E119 Type 2 diabetes mellitus without complications: Secondary | ICD-10-CM

## 2018-07-15 NOTE — Patient Instructions (Signed)
Patient Instructions      1. Continue to monitor carbohydrate and protein intake- remember to keep your           total  carbohydrates to 50 grams or less per day for best results.  2. Remember hydration goals - usually 48 to 64 ounces of liquids per day  3. Continue to work towards exercise goals - minimum 3 days per week of 45          minutes to  1 hour at a time.  4. Remember to take vitamins as directed        Supplement Resource Guide    Importance of Protein:   Maintains lean body mass, produces antibodies to fight off infections, heals wounds, minimizes hair loss, helps to give you energy, helps with satiety, and keeping you full between meals.    Importance of Calcium:  Needed for healthy bones and teeth, normal blood clotting, and nervous system functioning, higher risk of osteoporosis and bone disease with non-compliance.    Importance of Multivitamins:  Many functions.  Supply you with extra nutrients that you may be missing from food.  May lead to iron deficiency anemia, weakness, fatigue, and many other symptoms with non-compliance.    Importance of B Vitamins:  Important for red blood cell formation, metabolism, energy, and helps to maintain a healthy nervous system.    Protein Supplement  Find one you like now. Use immediately after surgery.   Look for:  35-50g protein each day from your protein supplement once you reach the progression diet.    0-3 g fat per serving  0-3 g sugar per serving    Protein drinks should be split in separate dosages.    Recommend: Lifelong  1 year + Calcium Supplement:     Start taking within a month after surgery.   Look for: Calcium Citrate Plus D (1500 mg per day)  Recommend: Citracal     .          Avoid chocolate chewable calcium. Can use chewable bariatric or [employer] brand or similar chewable.    The body cannot absorb more than 500-600 mg @ a time.      Take for Life Multi-vitamin Supplement:       1st Month After Surgery: Any complete chewable, such as: Flintstone???s Complete chewables.    Avoid [employer]stone sours or gummies.  They lack iron and other important nutrients and also have added sugar.    Continue with chewable vitamin or change to adult complete multivitamin one month after surgery. Menstruating women can take a prenatal vitamin.    Make sure has at least 18 mg iron and 400-800 mcg folic acid):    Vitamin B12, B Complex Vitamin, and Biotin  Start taking within a month after surgery.   Vitamin B12:  1000 mcg of Vitamin B12 three times weekly    Must take sublingually (meaning you take it under your tongue) or in a liquid drop form for easy absorption.        B Complex Vitamin: Take a pill or liquid drop form once daily.       Biotin: This vitamin can help prevent hair loss.    Recommend 5mg   (5000 mcg) a day  Biotin is Optional

## 2018-07-15 NOTE — Patient Instructions (Signed)
Goals:  1. Start taking your daily MVI (Kid's version, hard texture, no gummies) - once a day now and it'll increase to twice a day after surgery for life.    2. Continue eating three meals a day focusing on protein and non-starchy vegetables.  Continue using your protein shake as a meal replacement and/or snack    3. Continue drinking 64 ounces of non-caloric fluid a day     4. Continue walking 2-3 days a week for 30 minutes.    5. Start working on the 30:30 rule - so you need to take 30 minutes to eat your meal (chewing each bite so it's like applesauce consistency) and then waiting 30 minutes before you drink (taking 30 minutes to drink your drink) and vice versa.

## 2018-07-15 NOTE — Progress Notes (Signed)
Medical Weight Loss Multi-Disciplinary Program    Name: Alexandria Vasquez   DOB: 31-Oct-1995    Session# 6  Date: 07/15/2018     Height: _0  (170.2 cm)    Weight: 112.9 kg (249 lb) lbs.  Body mass index is 39 kg/m??.   Pounds Lost: 0 Pounds Gained: 0 - same weight     Dietary Instructions    Reviewed intake  Instruction given for personal dietary changes  Discussed perceived compliance  Comments: reviewed patient's past monthly diet hx.  Patient states she worked on her portion sizes and increased her vegetables intake. She also states she's been cutting out a lot of junk food (like candy and chips) - cut those out completely - didn't replace it with anything, just left those things alone.    Fluid - pt is no longer drinking any soda, drinking 3 bottles of water a day - pt knows she's not allowed to drink caffeine after surgery    MVI - pt is not taking her MVI yet      Physical Activity/Exercise    Reviewed Activity Log  Discussed Perceived Compliance  Reasonable Goals Set  Motivation  Comments: pt is walking 2-3 days a week for 30 minutes (on the trail).      Behavior Modification    Reviewed behavior modification log  Identify obstacles to trigger change  Achieving/Rewarding goals met  Positive attitude  Discussed perceived compliance  Comments:     Goals:  1. Start taking your daily MVI (Kid's version, hard texture, no gummies) - once a day now and it'll increase to twice a day after surgery for life.    2. Continue eating three meals a day focusing on protein and non-starchy vegetables.  Continue using your protein shake as a meal replacement and/or snack    3. Continue drinking 64 ounces of non-caloric fluid a day     4. Continue walking 2-3 days a week for 30 minutes.    5. Start working on the 30:30 rule - so you need to take 30 minutes to eat your meal (chewing each bite so it's like applesauce consistency) and then waiting 30 minutes before you drink (taking 30 minutes to drink your drink) and vice versa.          Candidate for surgery (per RD): YES    Dietitian: Wendall Stade

## 2018-07-15 NOTE — Progress Notes (Signed)
Bariatric Surgery Consultation    Subjective:     Alexandria Vasquez is a 22 y.o. obese female with a Body mass index is 39 kg/m??..  she desires surgery at this time because of health issues and quality of life issues.  Alexandria Vasquez has tried multiple diets in her lifetime most recently tried physician supervised, behavior modification and unsupervised diets.    Bariatric comorbidities present are   Patient Active Problem List   Diagnosis Code   ??? Obesity, morbid (HCC) E66.01   ??? Hypertension I10   ??? Diabetes (HCC) E11.9   ??? Morbid obesity with BMI of 40.0-44.9, adult (HCC) E66.01, Z68.41   ??? 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 Severe obesity (BMI 35.0-39.9) with comorbidity (HCC) E66.01   ??? Uses birth control Z78.9   ??? Smoking history Z87.891     The patient desires laparoscopic sleeve gastrectomy for surgical weight.  Alexandria Vasquez is here today to check progress with weight loss / evaluate nutritional status and review all subspecialty clearances in hopes of proceeding to the operating room.     Patient Active Problem List    Diagnosis Date Noted   ??? Severe obesity (BMI 35.0-39.9) with comorbidity (HCC)    ??? Uses birth control    ??? Smoking history    ??? Obesity, morbid (HCC) 01/05/2018   ??? Morbid obesity with BMI of 40.0-44.9, adult (HCC) 01/05/2018   ??? Hypertension    ??? Diabetes (HCC)    ??? Sleep disorder breathing       Past Surgical History:   Procedure Laterality Date   ??? HX TONSILLECTOMY     ??? HX WISDOM TEETH EXTRACTION        Social History     Tobacco Use   ??? Smoking status: Former Smoker     Packs/day: 0.10     Types: Cigarettes     Start date: 12/24/2016     Last attempt to quit: 06/19/2017     Years since quitting: 1.0   ??? Smokeless tobacco: Never Used   Substance Use Topics   ??? Alcohol use: Never     Frequency: Never      Family History   Problem Relation Age of Onset   ??? Hypertension Mother    ??? Diabetes Mother    ??? Hypertension Maternal Grandmother    ??? Diabetes Maternal Grandmother     ??? Hypertension Maternal Grandfather    ??? Diabetes Maternal Grandfather       Current Outpatient Medications   Medication Sig Dispense Refill   ??? glimepiride (AMARYL) 2 mg tablet   3   ??? losartan (COZAAR) 25 mg tablet      ??? metFORMIN (GLUCOPHAGE) 500 mg tablet Take 500 mg by mouth two (2) times daily (with meals).     ??? etonogestrel (NEXPLANON) 68 mg impl by SubDERmal route.       No Known Allergies       Review of Systems:        General - No history or complaints of unexpected fever, chills, or weight loss  Head/Neck - No history or complaints of headache, diplopia, dysphagia, hearing loss  Cardiac - No history or complaints of chest pain, palpitations, murmur, or shortness of breath  Pulmonary - No history or complaints of shortness of breath, productive cough, hemoptysis  Gastrointestinal - No history or complaints of reflux,  abdominal pain, obstipation/constipation, blood per rectum  Genitourinary - No history or complaints of hematuria/dysuria, stress urinary incontinence  symptoms, or renal lithiasis  Musculoskeletal - No history or complaints of joint pain or muscular weakness  Hematologic - No history or complaints of bleeding disorders, blood transfusions, sickle cell anemia  Neurologic - No history or complaints of  migraine headaches, seizure activity, syncopal episodes, TIA or stroke  Integumentary - No history or complaints of rashes, abnormal nevi, skin cancer  Gynecological - No history of heavy menses/abnormal menses    Objective:     Visit Vitals  BP 120/82 (BP 1 Location: Left arm, BP Patient Position: Sitting)   Pulse 76   Temp 97.6 ??F (36.4 ??C)   Resp 16   Ht 5\' 7"  (1.702 m)   Wt 112.9 kg (249 lb)   SpO2 100%   BMI 39.00 kg/m??     Physical Examination: General appearance - alert, well appearing, and in no distress  Mental status - alert, oriented to person, place, and time  Eyes - pupils equal and reactive, extraocular eye movements intact  Ears - bilateral TM's and external ear canals normal   Nose - normal and patent, no erythema, discharge or polyps  Mouth - mucous membranes moist, pharynx normal without lesions  Neck - supple, no significant adenopathy  Lymphatics - no palpable lymphadenopathy, no hepatosplenomegaly  Chest - clear to auscultation, no wheezes, rales or rhonchi, symmetric air entry  Heart - normal rate, regular rhythm, normal S1, S2, no murmurs, rubs, clicks or gallops  Abdomen - soft, nontender, nondistended, no masses or organomegaly  Back exam - full range of motion, no tenderness, palpable spasm or pain on motion  Neurological - alert, oriented, normal speech, no focal findings or movement disorder noted  Musculoskeletal - no joint tenderness, deformity or swelling  Extremities - peripheral pulses normal, no pedal edema, no clubbing or cyanosis  Skin - normal coloration and turgor, no rashes, no suspicious skin lesions noted    Labs:             Assessment:     Morbid obesity with associated comorbidity & poor DM medication compliance     Plan:     Continuation of Pre-Operative evaluation / clearance.    Alexandria Vasquez has returned to the office today to discuss her status as a surgical candidate.  her progress has been noted and reviewed.  We will continue the pre-operative process and work towards goals as outlined.    she has NA more pounds to lose before proceeding to the OR.  (14 pounds lost since initial consult visit 6 months ago)  she has NA more nutritional visits to complete before proceeding to the OR  she has an outstanding NA clearance to review before proceeding to the OR.    Alexandria Vasquez understand the rationales for all the above.  It has been discussed that given her obese condition that the best surgical option for this patient would be the laparoscopic sleeve gastrectomy.  Alexandria Vasquez agrees with the surgical choice and has been educated in it's; risks, benefits, and alternatives.  We will continue with the pre-operative  evaluation as needed to check progress.    The patient understands the plan of action    She has completed all aspects of her pre-op evaluation to include losing more weight that requested.      She states today that a recent HgbA1c via here PCP was "over 8".  She goes on to state the she is not very compliant in taking her two DM meds.    We  have had a long discussion with the need from proper DM control prior to leading into surgery.  She will be more compliant with her medication and will cont to work on diet to ensure her HgbA1c and overall glucose levels are more suitable to proceed with surgery in early 2020    Secondary Diagnoses:     Dietary Intervention  - The patient is currently scheduled to see or has been followed by a bariatric nutritionist for an attempt at preoperative weight loss as has been dictated by their insurance carrier.  They will be assessed at various times during their follow up to evaluate their progress depending on the length of time that is required once again by their carrier.  I have explained the importance of preoperative weight loss and the benefits regarding lower surgical risk and also assisting the patient in reaching their weight loss goal.  Finally they understand their is a physiologic benefit from the standpoint of hepatic volume reduction preoperatively.  I have reiterated the importance of a low carbohydrate and high protein regimen to achieve their stated goal.    Hypertension - The patient has a clear understanding of how weight loss improves hypertension as a whole, but also they understand that there is a significant genetic component to this disease process. We will monitor the patient???s blood pressure while in the hospital and the plan would be to continue those medications postoperatively.?? If a diuretic is being used we will stop them on discharge to prevent dehydration particularly with the sleeve gastrectomy and the gastric bypass procedures.?? They will be  instructed to monitor their blood pressure postoperatively while at home and notify their primary care physician in the event of any significantly high or uncharacteristic readings.    Adult Onset Diabetes - The patient has ben given a very low carbohydrate diet preoperatively along with instructions to monitor their blood sugars on a regular daily basis. When?? their surgery is performed?? we will be monitoring the patient with sliding scale insulin and accuchecks.?? Based on those values we will determine whether the patient needs a reduction of those medications postoperatively or total removal of those medications on discharge.?? We will have the patient continue accuchecks postoperatively while at home also and report to me or their family physician for appropriate adjustments as needed.?? The patient also understands that in the event of uncontrolled blood sugar preoperatively that we may choose to postpone their surgery.      Signed By: Crissie ReeseAnthony D Amie Cowens, MD     July 15, 2018

## 2018-07-15 NOTE — Progress Notes (Signed)
 Medical Weight Loss Multi-Disciplinary Program    Name: Alexandria Vasquez   DOB: Mar 08, 1996    Session# 6  Date: 07/15/2018     Height: 5' 7 (170.2 cm)    Weight: 112.9 kg (249 lb) lbs.  Body mass index is 39 kg/m.   Pounds Lost: 0 Pounds Gained: 0 - same weight     Dietary Instructions    Reviewed intake  Instruction given for personal dietary changes  Discussed perceived compliance  Comments: reviewed patient's past monthly diet hx.  Patient states she worked on her portion sizes and increased her vegetables intake. She also states she's been cutting out a lot of junk food (like candy and chips) - cut those out completely - didn't replace it with anything, just left those things alone.    Fluid - pt is no longer drinking any soda, drinking 3 bottles of water a day - pt knows she's not allowed to drink caffeine after surgery    MVI - pt is not taking her MVI yet      Physical Activity/Exercise    Reviewed Activity Log  Discussed Perceived Compliance  Reasonable Goals Set  Motivation  Comments: pt is walking 2-3 days a week for 30 minutes (on the trail).      Behavior Modification    Reviewed behavior modification log  Identify obstacles to trigger change  Achieving/Rewarding goals met  Positive attitude  Discussed perceived compliance  Comments:     Goals:  1. Start taking your daily MVI (Kid's version, hard texture, no gummies) - once a day now and it'll increase to twice a day after surgery for life.    2. Continue eating three meals a day focusing on protein and non-starchy vegetables.  Continue using your protein shake as a meal replacement and/or snack    3. Continue drinking 64 ounces of non-caloric fluid a day     4. Continue walking 2-3 days a week for 30 minutes.    5. Start working on the 30:30 rule - so you need to take 30 minutes to eat your meal (chewing each bite so it's like applesauce consistency) and then waiting 30 minutes before you drink (taking 30 minutes to drink your drink) and vice versa.          Candidate for surgery (per RD): YES    Dietitian: Hoy Blanc

## 2018-07-15 NOTE — Progress Notes (Signed)
Bariatric Surgery Consultation    Subjective:     Alexandria Vasquez is a 22 y.o. obese female with a Body mass index is 39 kg/m??..  she desires surgery at this time because of health issues and quality of life issues.  Alexandria Vasquez has tried multiple diets in her lifetime most recently tried physician supervised, behavior modification and unsupervised diets.    Bariatric comorbidities present are   Patient Active Problem List   Diagnosis Code   ??? Obesity, morbid (HCC) E66.01   ??? Hypertension I10   ??? Diabetes (HCC) E11.9   ??? Morbid obesity with BMI of 40.0-44.9, adult (HCC) E66.01, Z68.41   ??? 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 Severe obesity (BMI 35.0-39.9) with comorbidity (HCC) E66.01   ??? Uses birth control Z78.9   ??? Smoking history Z87.891     The patient desires laparoscopic sleeve gastrectomy for surgical weight.  Alexandria Vasquez is here today to check progress with weight loss / evaluate nutritional status and review all subspecialty clearances in hopes of proceeding to the operating room.     Patient Active Problem List    Diagnosis Date Noted   ??? Severe obesity (BMI 35.0-39.9) with comorbidity (HCC)    ??? Uses birth control    ??? Smoking history    ??? Obesity, morbid (HCC) 01/05/2018   ??? Morbid obesity with BMI of 40.0-44.9, adult (HCC) 01/05/2018   ??? Hypertension    ??? Diabetes (HCC)    ??? Sleep disorder breathing       Past Surgical History:   Procedure Laterality Date   ??? HX TONSILLECTOMY     ??? HX WISDOM TEETH EXTRACTION        Social History     Tobacco Use   ??? Smoking status: Former Smoker     Packs/day: 0.10     Types: Cigarettes     Start date: 12/24/2016     Last attempt to quit: 06/19/2017     Years since quitting: 1.0   ??? Smokeless tobacco: Never Used   Substance Use Topics   ??? Alcohol use: Never     Frequency: Never      Family History   Problem Relation Age of Onset   ??? Hypertension Mother    ??? Diabetes Mother    ??? Hypertension Maternal Grandmother    ??? Diabetes Maternal Grandmother    ??? Hypertension  Maternal Grandfather    ??? Diabetes Maternal Grandfather       Current Outpatient Medications   Medication Sig Dispense Refill   ??? glimepiride (AMARYL) 2 mg tablet   3   ??? losartan (COZAAR) 25 mg tablet      ??? metFORMIN (GLUCOPHAGE) 500 mg tablet Take 500 mg by mouth two (2) times daily (with meals).     ??? etonogestrel (NEXPLANON) 68 mg impl by SubDERmal route.       No Known Allergies       Review of Systems:        General - No history or complaints of unexpected fever, chills, or weight loss  Head/Neck - No history or complaints of headache, diplopia, dysphagia, hearing loss  Cardiac - No history or complaints of chest pain, palpitations, murmur, or shortness of breath  Pulmonary - No history or complaints of shortness of breath, productive cough, hemoptysis  Gastrointestinal - No history or complaints of reflux,  abdominal pain, obstipation/constipation, blood per rectum  Genitourinary - No history or complaints of hematuria/dysuria, stress urinary incontinence  symptoms, or renal lithiasis  Musculoskeletal - No history or complaints of joint pain or muscular weakness  Hematologic - No history or complaints of bleeding disorders, blood transfusions, sickle cell anemia  Neurologic - No history or complaints of  migraine headaches, seizure activity, syncopal episodes, TIA or stroke  Integumentary - No history or complaints of rashes, abnormal nevi, skin cancer  Gynecological - No history of heavy menses/abnormal menses    Objective:     Visit Vitals  BP 120/82 (BP 1 Location: Left arm, BP Patient Position: Sitting)   Pulse 76   Temp 97.6 ??F (36.4 ??C)   Resp 16   Ht 5\' 7"  (1.702 m)   Wt 112.9 kg (249 lb)   SpO2 100%   BMI 39.00 kg/m??     Physical Examination: General appearance - alert, well appearing, and in no distress  Mental status - alert, oriented to person, place, and time  Eyes - pupils equal and reactive, extraocular eye movements intact  Ears - bilateral TM's and external ear canals normal  Nose - normal  and patent, no erythema, discharge or polyps  Mouth - mucous membranes moist, pharynx normal without lesions  Neck - supple, no significant adenopathy  Lymphatics - no palpable lymphadenopathy, no hepatosplenomegaly  Chest - clear to auscultation, no wheezes, rales or rhonchi, symmetric air entry  Heart - normal rate, regular rhythm, normal S1, S2, no murmurs, rubs, clicks or gallops  Abdomen - soft, nontender, nondistended, no masses or organomegaly  Back exam - full range of motion, no tenderness, palpable spasm or pain on motion  Neurological - alert, oriented, normal speech, no focal findings or movement disorder noted  Musculoskeletal - no joint tenderness, deformity or swelling  Extremities - peripheral pulses normal, no pedal edema, no clubbing or cyanosis  Skin - normal coloration and turgor, no rashes, no suspicious skin lesions noted    Labs:             Assessment:     Morbid obesity with associated comorbidity & poor DM medication compliance     Plan:     Continuation of Pre-Operative evaluation / clearance.    Alexandria Vasquez has returned to the office today to discuss her status as a surgical candidate.  her progress has been noted and reviewed.  We will continue the pre-operative process and work towards goals as outlined.    she has NA more pounds to lose before proceeding to the OR.  (14 pounds lost since initial consult visit 6 months ago)  she has NA more nutritional visits to complete before proceeding to the OR  she has an outstanding NA clearance to review before proceeding to the OR.    Alexandria Vasquez understand the rationales for all the above.  It has been discussed that given her obese condition that the best surgical option for this patient would be the laparoscopic sleeve gastrectomy.  Alexandria Vasquez agrees with the surgical choice and has been educated in it's; risks, benefits, and alternatives.  We will continue with the pre-operative evaluation as needed to check progress.    The  patient understands the plan of action    She has completed all aspects of her pre-op evaluation to include losing more weight that requested.      She states today that a recent HgbA1c via here PCP was "over 8".  She goes on to state the she is not very compliant in taking her two DM meds.    We  have had a long discussion with the need from proper DM control prior to leading into surgery.  She will be more compliant with her medication and will cont to work on diet to ensure her HgbA1c and overall glucose levels are more suitable to proceed with surgery in early 2020    Secondary Diagnoses:     Dietary Intervention  - The patient is currently scheduled to see or has been followed by a bariatric nutritionist for an attempt at preoperative weight loss as has been dictated by their insurance carrier.  They will be assessed at various times during their follow up to evaluate their progress depending on the length of time that is required once again by their carrier.  I have explained the importance of preoperative weight loss and the benefits regarding lower surgical risk and also assisting the patient in reaching their weight loss goal.  Finally they understand their is a physiologic benefit from the standpoint of hepatic volume reduction preoperatively.  I have reiterated the importance of a low carbohydrate and high protein regimen to achieve their stated goal.    Hypertension - The patient has a clear understanding of how weight loss improves hypertension as a whole, but also they understand that there is a significant genetic component to this disease process. We will monitor the patient???s blood pressure while in the hospital and the plan would be to continue those medications postoperatively.?? If a diuretic is being used we will stop them on discharge to prevent dehydration particularly with the sleeve gastrectomy and the gastric bypass procedures.?? They will be instructed to monitor their blood pressure  postoperatively while at home and notify their primary care physician in the event of any significantly high or uncharacteristic readings.    Adult Onset Diabetes - The patient has ben given a very low carbohydrate diet preoperatively along with instructions to monitor their blood sugars on a regular daily basis. When?? their surgery is performed?? we will be monitoring the patient with sliding scale insulin and accuchecks.?? Based on those values we will determine whether the patient needs a reduction of those medications postoperatively or total removal of those medications on discharge.?? We will have the patient continue accuchecks postoperatively while at home also and report to me or their family physician for appropriate adjustments as needed.?? The patient also understands that in the event of uncontrolled blood sugar preoperatively that we may choose to postpone their surgery.      Signed By: Crissie Reese, MD     July 15, 2018

## 2018-08-08 ENCOUNTER — Encounter

## 2018-08-10 ENCOUNTER — Ambulatory Visit: Attending: Specialist

## 2018-08-10 ENCOUNTER — Encounter: Primary: Family Medicine

## 2018-08-10 ENCOUNTER — Ambulatory Visit
Admit: 2018-08-10 | Discharge: 2018-08-10 | Payer: PRIVATE HEALTH INSURANCE | Attending: Specialist | Primary: Family Medicine

## 2018-08-10 NOTE — H&P (View-Only) (Signed)
Sleeve Gastrectomy - History and Physical    Subjective:     The patient is a 22 y.o. obese female with a Body mass index is 39.09 kg/m??.   she presents now to review their work up to date to see if they are a candidate for surgery and whether or not to proceed with the previously requested procedure.  Bariatric comorbidities continue to include:   Patient Active Problem List   Diagnosis Code   ??? Obesity, morbid (HCC) E66.01   ??? Hypertension I10   ??? Diabetes (HCC) E11.9   ??? Morbid obesity with BMI of 40.0-44.9, adult (HCC) E66.01, Z68.41   ??? 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 Severe obesity (BMI 35.0-39.9) with comorbidity (HCC) E66.01   ??? Uses birth control Z78.9   ??? Smoking history Z87.891       They have been generally well prior to this visit and have had no recent significant illnesses. The patient has had no gastrointestinal issues that would preclude them from proceeding with the surgery they have chosen. Brittaney Wigington has recently tried a preoperative weight loss program  in addition to seeing a bariatric nutritionist preoperatively. We have discussed on at least one other occasion about the various types of surgical weight loss procedures and they have considered these options after our initial consultation. We have once again discussed these procedures in detail and they have now decided on a surgical procedure.  They present today to discuss this and confirm that their evaluation pre operatively is acceptable to continue with surgery.    The patient desires laparoscopic sleeve gastrectomy for surgical weight loss.      The patients goal weight is 178 lb. (this represents a BMI of 28)    These goals are consistent with expected outcomes of their desired operation.  her Medical goals are resolution of these health issues.    Since the patient's original consult 7 months ago they have been seen by their PCP for routine medical care.  There has been no change to their  overall medical or surgical history and they have been on no steroids in any form.  ??  She has lost 14 lbs over the past 7 months  ??  UGI was normal     Patient Active Problem List    Diagnosis Date Noted   ??? Severe obesity (BMI 35.0-39.9) with comorbidity (HCC)    ??? Uses birth control    ??? Smoking history    ??? Obesity, morbid (HCC) 01/05/2018   ??? Morbid obesity with BMI of 40.0-44.9, adult (HCC) 01/05/2018   ??? Hypertension    ??? Diabetes (HCC)    ??? Sleep disorder breathing      Past Surgical History:   Procedure Laterality Date   ??? HX TONSILLECTOMY     ??? HX WISDOM TEETH EXTRACTION        Social History     Tobacco Use   ??? Smoking status: Former Smoker     Packs/day: 0.10     Types: Cigarettes     Start date: 12/24/2016     Last attempt to quit: 06/19/2017     Years since quitting: 1.1   ??? Smokeless tobacco: Never Used   Substance Use Topics   ??? Alcohol use: Never     Frequency: Never      Family History   Problem Relation Age of Onset   ??? Hypertension Mother    ??? Diabetes Mother    ??? Hypertension Maternal   Grandmother    ??? Diabetes Maternal Grandmother    ??? Hypertension Maternal Grandfather    ??? Diabetes Maternal Grandfather       Current Outpatient Medications   Medication Sig Dispense Refill   ??? glimepiride (AMARYL) 2 mg tablet   3   ??? losartan (COZAAR) 25 mg tablet      ??? metFORMIN (GLUCOPHAGE) 500 mg tablet Take 500 mg by mouth two (2) times daily (with meals).     ??? etonogestrel (NEXPLANON) 68 mg impl by SubDERmal route.       No Known Allergies     Review of Systems:     General - No history or complaints of unexpected fever, chills, or weight loss  Head/Neck - No history or complaints of headache, diplopia, dysphagia, hearing loss  Cardiac - No history or complaints of chest pain, palpitations, murmur, or shortness of breath  Pulmonary - No history or complaints of shortness of breath, productive cough, hemoptysis  Gastrointestinal - Rare reflux,  abdominal pain, obstipation/constipation, blood per rectum   Genitourinary - No history or complaints of hematuria/dysuria, stress urinary incontinence symptoms, or renal lithiasis  Musculoskeletal - No history or complaints of joint pain or muscular weakness  Hematologic - No history or complaints of bleeding disorders, blood transfusions, sickle cell anemia  Neurologic - No history or complaints of  migraine headaches, seizure activity, syncopal episodes, TIA or stroke  Integumentary - No history or complaints of rashes, abnormal nevi, skin cancer  Gynecological - No menses since Nexplanon impanted    Objective:     Visit Vitals  BP 147/76 (BP 1 Location: Left arm, BP Patient Position: Sitting)   Pulse 81   Resp 16   Ht 5' 7" (1.702 m)   Wt 113.2 kg (249 lb 9.6 oz)   SpO2 97%   BMI 39.09 kg/m??       Physical Examination: General appearance - alert, well appearing, and in no distress and oriented to person, place, and time  Mental status - alert, oriented to person, place, and time, normal mood, behavior, speech, dress, motor activity, and thought processes  Eyes - pupils equal and reactive, extraocular eye movements intact, sclera anicteric, left eye normal, right eye normal  Ears - bilateral TM's and external ear canals normal, right ear normal, left ear normal  Nose - normal and patent, no erythema, discharge or polyps  Mouth - mucous membranes moist, pharynx normal without lesions  Neck - supple, no significant adenopathy  Lymphatics - no palpable lymphadenopathy, no hepatosplenomegaly  Chest - clear to auscultation, no wheezes, rales or rhonchi, symmetric air entry  Heart - normal rate, regular rhythm, normal S1, S2, no murmurs, rubs, clicks or gallops  Abdomen - soft, nontender, nondistended, no masses or organomegaly  Back exam - full range of motion, no tenderness, palpable spasm or pain on motion  Neurological - alert, oriented, normal speech, no focal findings or movement disorder noted  Musculoskeletal - no joint tenderness, deformity or swelling   Extremities - peripheral pulses normal, no pedal edema, no clubbing or cyanosis  Skin - normal coloration and turgor, no rashes, no suspicious skin lesions noted    Labs / Preoperative Evaluation:      No results found for this or any previous visit (from the past 1008 hour(s)).    Assessment:     Morbid obesity with comorbidity    Plan:     laparoscopic sleeve gastrectomy    This is a 22 y.o. female   with a BMI of Body mass index is 39.09 kg/m??. and the weight-related co-morbidties as noted above. Abrielle meets the NIH criteria for bariatric surgery based upon the BMI of Body mass index is 39.09 kg/m??. and multiple weight-related co-morbidties. Roselani has elected laparoscopic sleeve gastrectomy as her intervention of choice for treatment of morbid obestiy through surgical means secondary to its safety profile, rapid return to work  and decreases in operative risks over gastric bypass.    In the office today, following Delayni's history and physical examination, a 40 minute discussion regarding the anatomic alterations for the laparoscopic sleeve gastrectomy was undertaken. The dietary expectations and the patient  dependent factors for success were thoroughly discussed, to include the need for interval follow-up and long-term dietary changes associated with success. The possible complications of the sleeve gastrectomy  were also discussed, to include;death, DVT/PE, staple line leak, bleeding, stricture formation, infection, nutritional deficiencies and sleeve dilation.  Specific weight related outcomes for success were also discussed with an emphasis on careful and close follow-up with the first year and eating behavior modification as the baseline and cyclical hunger return.  The patient expressed an understanding of the above factors, and her questions were answered in their entirety.    In addition, the patient attended a 1.5 hour power point seminar regarding  obesity, surgical weight loss including, adjustable gastric band, gastric bypass, and sleeve gastrectomy.  This discussion contrasted the different surgical techniques, mechanisms of actions and expected outcomes, and surgical and medical risks associated with each procedure.  During this seminar, there was a long question and answer session where each questions was answered until there were no additional questions.     Today, the patient had all of her questions answered and the decision was made today that the patient's preoperative evaluation is acceptable for them  to proceed with bariatric surgery  choosing the sleeve gastrectomy as her surgical option.    The patient understands the plan of action      Secondary Diagnoses:     DVT / Pulmonary Embolus Risk - The patient is at a higher risk for post operative DVT / pulmonary embolus secondary to their morbid obese status, relative sedentary lifestyle, and impending general anesthetic.  We will plan to use anticoagulation therapy pre and post operative as well as  pneumatic compression devices and encourage ambulation once on the hospital nursing floor.  The need for possible at home anticoagulation therapy has also been discussed and any decision on this matter will be made during post operative evaluations. The patient understands that their efforts at ambulation are of vital importance to reduce the risk of this complication thus placing significant burden on them as to the prevention of such issues. Signs and symptoms of DVT / PE have been discussed with the patient and they have been instructed to call the office if any these occur in the "at home" post op phase    Hypertension - The patient has a clear understanding of how weight loss improves hypertension as a whole, but also they understand that there is a significant genetic component to this disease process. We will monitor the patient???s blood pressure while in the hospital and the plan would be to  continue those medications postoperatively.?? If a diuretic is being used we will stop them on discharge to prevent dehydration particularly with the sleeve gastrectomy and the gastric bypass procedures.?? They will be instructed to monitor their blood pressure postoperatively while at home and notify their primary   care physician in the event of any significantly high or uncharacteristic readings.  ??  Adult Onset Diabetes - The patient has ben given a very low carbohydrate diet preoperatively along with instructions to monitor their blood sugars on a regular daily basis. When?? their surgery is performed?? we will be monitoring the patient with sliding scale insulin and accuchecks.?? Based on those values we will determine whether the patient needs a reduction of those medications postoperatively or total removal of those medications on discharge.?? We will have the patient continue accuchecks postoperatively while at home also and report to me or their family physician for appropriate adjustments as needed.?? The patient also understands that in the event of uncontrolled blood sugar preoperatively that we may choose to postpone their surgery.    Signed By: Arieonna Medine D Tikia Skilton, MD     August 10, 2018

## 2018-08-10 NOTE — Progress Notes (Signed)
Sleeve Gastrectomy - History and Physical    Subjective:     The patient is a 22 y.o. obese female with a Body mass index is 39.09 kg/m??.   she presents now to review their work up to date to see if they are a candidate for surgery and whether or not to proceed with the previously requested procedure.  Bariatric comorbidities continue to include:   Patient Active Problem List   Diagnosis Code   ??? Obesity, morbid (HCC) E66.01   ??? Hypertension I10   ??? Diabetes (HCC) E11.9   ??? Morbid obesity with BMI of 40.0-44.9, adult (HCC) E66.01, Z68.41   ??? 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 Severe obesity (BMI 35.0-39.9) with comorbidity (HCC) E66.01   ??? Uses birth control Z78.9   ??? Smoking history Z87.891       They have been generally well prior to this visit and have had no recent significant illnesses. The patient has had no gastrointestinal issues that would preclude them from proceeding with the surgery they have chosen. Gianna Calef has recently tried a preoperative weight loss program  in addition to seeing a bariatric nutritionist preoperatively. We have discussed on at least one other occasion about the various types of surgical weight loss procedures and they have considered these options after our initial consultation. We have once again discussed these procedures in detail and they have now decided on a surgical procedure.  They present today to discuss this and confirm that their evaluation pre operatively is acceptable to continue with surgery.    The patient desires laparoscopic sleeve gastrectomy for surgical weight loss.      The patients goal weight is 178 lb. (this represents a BMI of 28)    These goals are consistent with expected outcomes of their desired operation.  her Medical goals are resolution of these health issues.    Since the patient's original consult 7 months ago they have been seen by their PCP for routine medical care.  There has been no change to their  overall medical or surgical history and they have been on no steroids in any form.  ??  She has lost 14 lbs over the past 7 months  ??  UGI was normal     Patient Active Problem List    Diagnosis Date Noted   ??? Severe obesity (BMI 35.0-39.9) with comorbidity (HCC)    ??? Uses birth control    ??? Smoking history    ??? Obesity, morbid (HCC) 01/05/2018   ??? Morbid obesity with BMI of 40.0-44.9, adult (HCC) 01/05/2018   ??? Hypertension    ??? Diabetes (HCC)    ??? Sleep disorder breathing      Past Surgical History:   Procedure Laterality Date   ??? HX TONSILLECTOMY     ??? HX WISDOM TEETH EXTRACTION        Social History     Tobacco Use   ??? Smoking status: Former Smoker     Packs/day: 0.10     Types: Cigarettes     Start date: 12/24/2016     Last attempt to quit: 06/19/2017     Years since quitting: 1.1   ??? Smokeless tobacco: Never Used   Substance Use Topics   ??? Alcohol use: Never     Frequency: Never      Family History   Problem Relation Age of Onset   ??? Hypertension Mother    ??? Diabetes Mother    ??? Hypertension Maternal  Grandmother    ??? Diabetes Maternal Grandmother    ??? Hypertension Maternal Grandfather    ??? Diabetes Maternal Grandfather       Current Outpatient Medications   Medication Sig Dispense Refill   ??? glimepiride (AMARYL) 2 mg tablet   3   ??? losartan (COZAAR) 25 mg tablet      ??? metFORMIN (GLUCOPHAGE) 500 mg tablet Take 500 mg by mouth two (2) times daily (with meals).     ??? etonogestrel (NEXPLANON) 68 mg impl by SubDERmal route.       No Known Allergies     Review of Systems:     General - No history or complaints of unexpected fever, chills, or weight loss  Head/Neck - No history or complaints of headache, diplopia, dysphagia, hearing loss  Cardiac - No history or complaints of chest pain, palpitations, murmur, or shortness of breath  Pulmonary - No history or complaints of shortness of breath, productive cough, hemoptysis  Gastrointestinal - Rare reflux,  abdominal pain, obstipation/constipation, blood per rectum   Genitourinary - No history or complaints of hematuria/dysuria, stress urinary incontinence symptoms, or renal lithiasis  Musculoskeletal - No history or complaints of joint pain or muscular weakness  Hematologic - No history or complaints of bleeding disorders, blood transfusions, sickle cell anemia  Neurologic - No history or complaints of  migraine headaches, seizure activity, syncopal episodes, TIA or stroke  Integumentary - No history or complaints of rashes, abnormal nevi, skin cancer  Gynecological - No menses since Nexplanon impanted    Objective:     Visit Vitals  BP 147/76 (BP 1 Location: Left arm, BP Patient Position: Sitting)   Pulse 81   Resp 16   Ht 5\' 7"  (1.702 m)   Wt 113.2 kg (249 lb 9.6 oz)   SpO2 97%   BMI 39.09 kg/m??       Physical Examination: General appearance - alert, well appearing, and in no distress and oriented to person, place, and time  Mental status - alert, oriented to person, place, and time, normal mood, behavior, speech, dress, motor activity, and thought processes  Eyes - pupils equal and reactive, extraocular eye movements intact, sclera anicteric, left eye normal, right eye normal  Ears - bilateral TM's and external ear canals normal, right ear normal, left ear normal  Nose - normal and patent, no erythema, discharge or polyps  Mouth - mucous membranes moist, pharynx normal without lesions  Neck - supple, no significant adenopathy  Lymphatics - no palpable lymphadenopathy, no hepatosplenomegaly  Chest - clear to auscultation, no wheezes, rales or rhonchi, symmetric air entry  Heart - normal rate, regular rhythm, normal S1, S2, no murmurs, rubs, clicks or gallops  Abdomen - soft, nontender, nondistended, no masses or organomegaly  Back exam - full range of motion, no tenderness, palpable spasm or pain on motion  Neurological - alert, oriented, normal speech, no focal findings or movement disorder noted  Musculoskeletal - no joint tenderness, deformity or swelling   Extremities - peripheral pulses normal, no pedal edema, no clubbing or cyanosis  Skin - normal coloration and turgor, no rashes, no suspicious skin lesions noted    Labs / Preoperative Evaluation:      No results found for this or any previous visit (from the past 1008 hour(s)).    Assessment:     Morbid obesity with comorbidity    Plan:     laparoscopic sleeve gastrectomy    This is a 22 y.o. female  with a BMI of Body mass index is 39.09 kg/m??. and the weight-related co-morbidties as noted above. Lore meets the NIH criteria for bariatric surgery based upon the BMI of Body mass index is 39.09 kg/m??. and multiple weight-related co-morbidties. Rolland Bimlerlysha has elected laparoscopic sleeve gastrectomy as her intervention of choice for treatment of morbid obestiy through surgical means secondary to its safety profile, rapid return to work  and decreases in operative risks over gastric bypass.    In the office today, following Azalyn's history and physical examination, a 40 minute discussion regarding the anatomic alterations for the laparoscopic sleeve gastrectomy was undertaken. The dietary expectations and the patient  dependent factors for success were thoroughly discussed, to include the need for interval follow-up and long-term dietary changes associated with success. The possible complications of the sleeve gastrectomy  were also discussed, to include;death, DVT/PE, staple line leak, bleeding, stricture formation, infection, nutritional deficiencies and sleeve dilation.  Specific weight related outcomes for success were also discussed with an emphasis on careful and close follow-up with the first year and eating behavior modification as the baseline and cyclical hunger return.  The patient expressed an understanding of the above factors, and her questions were answered in their entirety.    In addition, the patient attended a 1.5 hour power point seminar regarding  obesity, surgical weight loss including, adjustable gastric band, gastric bypass, and sleeve gastrectomy.  This discussion contrasted the different surgical techniques, mechanisms of actions and expected outcomes, and surgical and medical risks associated with each procedure.  During this seminar, there was a long question and answer session where each questions was answered until there were no additional questions.     Today, the patient had all of her questions answered and the decision was made today that the patient's preoperative evaluation is acceptable for them  to proceed with bariatric surgery  choosing the sleeve gastrectomy as her surgical option.    The patient understands the plan of action      Secondary Diagnoses:     DVT / Pulmonary Embolus Risk - The patient is at a higher risk for post operative DVT / pulmonary embolus secondary to their morbid obese status, relative sedentary lifestyle, and impending general anesthetic.  We will plan to use anticoagulation therapy pre and post operative as well as  pneumatic compression devices and encourage ambulation once on the hospital nursing floor.  The need for possible at home anticoagulation therapy has also been discussed and any decision on this matter will be made during post operative evaluations. The patient understands that their efforts at ambulation are of vital importance to reduce the risk of this complication thus placing significant burden on them as to the prevention of such issues. Signs and symptoms of DVT / PE have been discussed with the patient and they have been instructed to call the office if any these occur in the "at home" post op phase    Hypertension - The patient has a clear understanding of how weight loss improves hypertension as a whole, but also they understand that there is a significant genetic component to this disease process. We will monitor the patient???s blood pressure while in the hospital and the plan would be to  continue those medications postoperatively.?? If a diuretic is being used we will stop them on discharge to prevent dehydration particularly with the sleeve gastrectomy and the gastric bypass procedures.?? They will be instructed to monitor their blood pressure postoperatively while at home and notify their primary  care physician in the event of any significantly high or uncharacteristic readings.  ??  Adult Onset Diabetes - The patient has ben given a very low carbohydrate diet preoperatively along with instructions to monitor their blood sugars on a regular daily basis. When?? their surgery is performed?? we will be monitoring the patient with sliding scale insulin and accuchecks.?? Based on those values we will determine whether the patient needs a reduction of those medications postoperatively or total removal of those medications on discharge.?? We will have the patient continue accuchecks postoperatively while at home also and report to me or their family physician for appropriate adjustments as needed.?? The patient also understands that in the event of uncontrolled blood sugar preoperatively that we may choose to postpone their surgery.    Signed By: Crissie Reese, MD     August 10, 2018

## 2018-08-10 NOTE — Progress Notes (Signed)
1. Have you been to the ER, urgent care clinic since your last visit?  Hospitalized since your last visit?No    2. Have you seen or consulted any other health care providers outside of the [facility] since your last visit?  Include any pap smears or colon screening. No        Chief Complaint   Patient presents with   ??? Pre-op Exam

## 2018-08-10 NOTE — Patient Instructions (Addendum)
Preparation for Surgery  Refer to your book for specific instructions    1. Stop taking all aspirin products, ibuprofen products, non-steroidal medications, blood thinners,  and herbal supplements as outlined in your book.     2. Absolutely no smoking.     3. If diabetic, monitor blood sugars regularly and alert the office of blood sugars over 200.    4. Have a supply of protein product and liquid diet items for your first two weeks as outlined in your book.    5. The day before your surgery is scheduled:  6.   ? Gastric Bypass and Sleeve:  Clear liquids and Protein Shakes    ? Gastric Band:   Eat lightly.  No snacking.     ? Drink lots of water         6.  Get prepared to meet a new you!        What You Need to Know About Opioid Pain Medicines   This guide is for you! Keep this guide and the Medication Guide that comes with your medicine so you can better understand what you need to know about your opioid pain medicine. Go over this information with your healthcare provider. Then, ask your healthcare provider about anything that you do not understand.   What are opioids?   Opioids are strong prescription medicines that are used to manage severe pain   What are the serious risks of using opioids?   ??? Opioids have serious risks of addiction and overdose.   ??? Too much opioid medicine in your body can cause your breathing to stop ??? which could lead to death. This risk is greater for people taking other medicines that make you feel sleepy or people with sleep apnea.   ??? Addiction is when you crave drugs (like opioid pain medicines) because they make you feel good in some way. You keep taking the drug even though you know it is not a good idea and bad things are happening to you. Addiction is a brain disease that may require ongoing treatment.    You can get addicted to opioids even though you take them exactly as prescribed, especially if taken for a long time.    If you think you might be addicted, talk to your healthcare provider right away.   If you take an opioid medicine for more than a few days, your body becomes physically ???dependent.??? This is normal and it means your body has gotten used to the medicine. You must taper off the opioid medicine (slowly take less medicine) when you no longer need it to avoid withdrawal symptoms.    Risk Factors for Opioid Abuse:   ??? You have:   ?? a history of addiction  ?? a family history of addiction  ??? You take medicines to treat mental health problems  ??? You are under the age of 65 (although anyone can abuse opioid medicines)        What should I avoid taking while I am taking opioids?  Unless prescribed by your healthcare provider, you should avoid taking alcohol or any of the following medicines with an opioid because it may cause you to stop breathing, which can lead to death:  ? Alcohol: Do not drink any kind of alcohol while you are taking opioid medicines  ? Benzodiazepines (like Valium or Xanax)  ? Muscle relaxants (like Soma or Flexeril)  ? Sleep medicines (like Ambien or Lunesta)  ? Other prescription opioid medicines               How can I take opioid pain medicine safely?   ??? Tell your healthcare provider about all the medicines you are taking, including vitamins, herbal supplements, and other over-the-counter medicines.   ??? Read the Medication Guide that comes with your prescription.  ??? Take your opioid medicine exactly as prescribed.   ??? Do not cut, break, chew, crush, or dissolve your medicine. If you cannot swallow your medicine whole, talk to your healthcare provider.   ??? When your healthcare provider gives you the prescription, ask:     ?? How long should I take it?     ?? What should I do if I need to taper off the opioid medicine (slowly take less medicine)?   ??? Call your healthcare provider if the opioid medicine is not controlling your pain. Do not increase the dose on your own.    ??? Do not share or give your opioid medicine to anyone else. Your healthcare provider selected this opioid and the dose just for you. A dose that is okay for you could cause an overdose and death for someone else. Also, it is against the law.   ??? Store your opioid medicine in a safe place where it cannot be reached by children or stolen by family or visitors to your home. Many teenagers like to experiment with pain medicines. Use a lock- box to keep your opioid medicine safe. Keep track of the amount of medicine you have.       ??? When you no longer need your opioid medicine, dispose of it as quickly as possible. The Food and Drug Administration recommends that most opioid medicines be promptly flushed down the toilet when no longer needed, unless a drug take-back option is immediately available. A list of the opioid medicines that can be flushed down the toilet is found here: https://www.fda.gov/drugdisposal              What other options are there to help with my pain?   Opioids are not the only thing that can help you control your pain. Ask your healthcare provider if your pain might be helped with a non-opioid medication, physical therapy, exercise, rest, acupuncture, types of behavioral therapy, or patient self-help techniques.   What is naloxone?   ??? Naloxone is a medicine that treats opioid overdose. It is sprayed inside your nose or injected into your body.   ??? Use naloxone if you have it and call 911 or go to the emergency room right away if:     - You or someone else has taken an opioid medicine and is having trouble breathing, is short of breath, or is unusually sleepy     - A child has accidentally taken the opioid medicine or you think they might have   ??? Giving naloxone to a person, even a child, who has not taken an opioid medicine will not hurt them.      Where can I get naloxone?   ??? There are some naloxone products that are designed for people to use in their home.    ??? Naloxone is available in pharmacies. Ask your healthcare provider about how you can get naloxone. In some states, you may not need a prescription.   ??? When you get your naloxone from the pharmacy, read the Patient Information on how to use naloxone and ask the pharmacist if anything is unclear.   ??? Tell your family about your naloxone and keep it in a place where you or your family   can get to it in an emergency.      Naloxone is never a substitute for emergency medical care. Always call 911 or go to the emergency room if you???ve used or given naloxone         Preparation for Surgery  Refer to your book for specific instructions    7. Stop taking all aspirin products, ibuprofen products, non-steroidal medications, blood thinners,  and herbal supplements as outlined in your book.     8. Absolutely no smoking.     9. If diabetic, monitor blood sugars regularly and alert the office of blood sugars over 200.    10. Have a supply of protein product and liquid diet items for your first two weeks as outlined in your book.    11. The day before your surgery is scheduled:  12.   ? Gastric Bypass and Sleeve:  Clear liquids and Protein Shakes    ? Gastric Band:   Eat lightly.  No snacking.     ? Drink lots of water         6.  Get prepared to meet a new you!

## 2018-08-10 NOTE — Progress Notes (Signed)
1. Have you been to the ER, urgent care clinic since your last visit?  Hospitalized since your last visit?No    2. Have you seen or consulted any other health care providers outside of the [facility] since your last visit?  Include any pap smears or colon screening. No        Chief Complaint   Patient presents with   . Pre-op Exam

## 2018-08-10 NOTE — Progress Notes (Signed)
Sleeve Gastrectomy - History and Physical    Subjective:     The patient is a 22 y.o. obese female with a Body mass index is 39.09 kg/m??.   she presents now to review their work up to date to see if they are a candidate for surgery and whether or not to proceed with the previously requested procedure.  Bariatric comorbidities continue to include:   Patient Active Problem List   Diagnosis Code   ??? Obesity, morbid (HCC) E66.01   ??? Hypertension I10   ??? Diabetes (HCC) E11.9   ??? Morbid obesity with BMI of 40.0-44.9, adult (HCC) E66.01, Z68.41   ??? 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 Severe obesity (BMI 35.0-39.9) with comorbidity (HCC) E66.01   ??? Uses birth control Z78.9   ??? Smoking history Z87.891       They have been generally well prior to this visit and have had no recent significant illnesses. The patient has had no gastrointestinal issues that would preclude them from proceeding with the surgery they have chosen. Lynetta Tomczak has recently tried a preoperative weight loss program  in addition to seeing a bariatric nutritionist preoperatively. We have discussed on at least one other occasion about the various types of surgical weight loss procedures and they have considered these options after our initial consultation. We have once again discussed these procedures in detail and they have now decided on a surgical procedure.  They present today to discuss this and confirm that their evaluation pre operatively is acceptable to continue with surgery.    The patient desires laparoscopic sleeve gastrectomy for surgical weight loss.      The patients goal weight is 178 lb. (this represents a BMI of 28)    These goals are consistent with expected outcomes of their desired operation.  her Medical goals are resolution of these health issues.    Since the patient's original consult 7 months ago they have been seen by their PCP for routine medical care.  There has been no change to their overall medical or surgical history  and they have been on no steroids in any form.  ??  She has lost 14 lbs over the past 7 months  ??  UGI was normal     Patient Active Problem List    Diagnosis Date Noted   ??? Severe obesity (BMI 35.0-39.9) with comorbidity (HCC)    ??? Uses birth control    ??? Smoking history    ??? Obesity, morbid (HCC) 01/05/2018   ??? Morbid obesity with BMI of 40.0-44.9, adult (HCC) 01/05/2018   ??? Hypertension    ??? Diabetes (HCC)    ??? Sleep disorder breathing      Past Surgical History:   Procedure Laterality Date   ??? HX TONSILLECTOMY     ??? HX WISDOM TEETH EXTRACTION        Social History     Tobacco Use   ??? Smoking status: Former Smoker     Packs/day: 0.10     Types: Cigarettes     Start date: 12/24/2016     Last attempt to quit: 06/19/2017     Years since quitting: 1.1   ??? Smokeless tobacco: Never Used   Substance Use Topics   ??? Alcohol use: Never     Frequency: Never      Family History   Problem Relation Age of Onset   ??? Hypertension Mother    ??? Diabetes Mother    ??? Hypertension Maternal  Grandmother    ??? Diabetes Maternal Grandmother    ??? Hypertension Maternal Grandfather    ??? Diabetes Maternal Grandfather       Current Outpatient Medications   Medication Sig Dispense Refill   ??? glimepiride (AMARYL) 2 mg tablet   3   ??? losartan (COZAAR) 25 mg tablet      ??? metFORMIN (GLUCOPHAGE) 500 mg tablet Take 500 mg by mouth two (2) times daily (with meals).     ??? etonogestrel (NEXPLANON) 68 mg impl by SubDERmal route.       No Known Allergies     Review of Systems:     General - No history or complaints of unexpected fever, chills, or weight loss  Head/Neck - No history or complaints of headache, diplopia, dysphagia, hearing loss  Cardiac - No history or complaints of chest pain, palpitations, murmur, or shortness of breath  Pulmonary - No history or complaints of shortness of breath, productive cough, hemoptysis  Gastrointestinal - Rare reflux,  abdominal pain, obstipation/constipation, blood per rectum  Genitourinary - No history or complaints  of hematuria/dysuria, stress urinary incontinence symptoms, or renal lithiasis  Musculoskeletal - No history or complaints of joint pain or muscular weakness  Hematologic - No history or complaints of bleeding disorders, blood transfusions, sickle cell anemia  Neurologic - No history or complaints of  migraine headaches, seizure activity, syncopal episodes, TIA or stroke  Integumentary - No history or complaints of rashes, abnormal nevi, skin cancer  Gynecological - No menses since Nexplanon impanted    Objective:     Visit Vitals  BP 147/76 (BP 1 Location: Left arm, BP Patient Position: Sitting)   Pulse 81   Resp 16   Ht 5\' 7"  (1.702 m)   Wt 113.2 kg (249 lb 9.6 oz)   SpO2 97%   BMI 39.09 kg/m??       Physical Examination: General appearance - alert, well appearing, and in no distress and oriented to person, place, and time  Mental status - alert, oriented to person, place, and time, normal mood, behavior, speech, dress, motor activity, and thought processes  Eyes - pupils equal and reactive, extraocular eye movements intact, sclera anicteric, left eye normal, right eye normal  Ears - bilateral TM's and external ear canals normal, right ear normal, left ear normal  Nose - normal and patent, no erythema, discharge or polyps  Mouth - mucous membranes moist, pharynx normal without lesions  Neck - supple, no significant adenopathy  Lymphatics - no palpable lymphadenopathy, no hepatosplenomegaly  Chest - clear to auscultation, no wheezes, rales or rhonchi, symmetric air entry  Heart - normal rate, regular rhythm, normal S1, S2, no murmurs, rubs, clicks or gallops  Abdomen - soft, nontender, nondistended, no masses or organomegaly  Back exam - full range of motion, no tenderness, palpable spasm or pain on motion  Neurological - alert, oriented, normal speech, no focal findings or movement disorder noted  Musculoskeletal - no joint tenderness, deformity or swelling  Extremities - peripheral pulses normal, no pedal edema,  no clubbing or cyanosis  Skin - normal coloration and turgor, no rashes, no suspicious skin lesions noted    Labs / Preoperative Evaluation:      No results found for this or any previous visit (from the past 1008 hour(s)).    Assessment:     Morbid obesity with comorbidity    Plan:     laparoscopic sleeve gastrectomy    This is a 22 y.o. female  with a BMI of Body mass index is 39.09 kg/m??. and the weight-related co-morbidties as noted above. Maysa meets the NIH criteria for bariatric surgery based upon the BMI of Body mass index is 39.09 kg/m??. and multiple weight-related co-morbidties. Taleen has elected laparoscopic sleeve gastrectomy as her intervention of choice for treatment of morbid obestiy through surgical means secondary to its safety profile, rapid return to work  and decreases in operative risks over gastric bypass.    In the office today, following Osiris's history and physical examination, a 40 minute discussion regarding the anatomic alterations for the laparoscopic sleeve gastrectomy was undertaken. The dietary expectations and the patient  dependent factors for success were thoroughly discussed, to include the need for interval follow-up and long-term dietary changes associated with success. The possible complications of the sleeve gastrectomy  were also discussed, to include;death, DVT/PE, staple line leak, bleeding, stricture formation, infection, nutritional deficiencies and sleeve dilation.  Specific weight related outcomes for success were also discussed with an emphasis on careful and close follow-up with the first year and eating behavior modification as the baseline and cyclical hunger return.  The patient expressed an understanding of the above factors, and her questions were answered in their entirety.    In addition, the patient attended a 1.5 hour power point seminar regarding obesity, surgical weight loss including, adjustable gastric band, gastric bypass, and sleeve gastrectomy.  This  discussion contrasted the different surgical techniques, mechanisms of actions and expected outcomes, and surgical and medical risks associated with each procedure.  During this seminar, there was a long question and answer session where each questions was answered until there were no additional questions.     Today, the patient had all of her questions answered and the decision was made today that the patient's preoperative evaluation is acceptable for them  to proceed with bariatric surgery  choosing the sleeve gastrectomy as her surgical option.    The patient understands the plan of action      Secondary Diagnoses:     DVT / Pulmonary Embolus Risk - The patient is at a higher risk for post operative DVT / pulmonary embolus secondary to their morbid obese status, relative sedentary lifestyle, and impending general anesthetic.  We will plan to use anticoagulation therapy pre and post operative as well as  pneumatic compression devices and encourage ambulation once on the hospital nursing floor.  The need for possible at home anticoagulation therapy has also been discussed and any decision on this matter will be made during post operative evaluations. The patient understands that their efforts at ambulation are of vital importance to reduce the risk of this complication thus placing significant burden on them as to the prevention of such issues. Signs and symptoms of DVT / PE have been discussed with the patient and they have been instructed to call the office if any these occur in the "at home" post op phase    Hypertension - The patient has a clear understanding of how weight loss improves hypertension as a whole, but also they understand that there is a significant genetic component to this disease process. We will monitor the patient???s blood pressure while in the hospital and the plan would be to continue those medications postoperatively.?? If a diuretic is being used we will stop them on discharge to prevent  dehydration particularly with the sleeve gastrectomy and the gastric bypass procedures.?? They will be instructed to monitor their blood pressure postoperatively while at home and notify their primary  care physician in the event of any significantly high or uncharacteristic readings.  ??  Adult Onset Diabetes - The patient has ben given a very low carbohydrate diet preoperatively along with instructions to monitor their blood sugars on a regular daily basis. When?? their surgery is performed?? we will be monitoring the patient with sliding scale insulin and accuchecks.?? Based on those values we will determine whether the patient needs a reduction of those medications postoperatively or total removal of those medications on discharge.?? We will have the patient continue accuchecks postoperatively while at home also and report to me or their family physician for appropriate adjustments as needed.?? The patient also understands that in the event of uncontrolled blood sugar preoperatively that we may choose to postpone their surgery.    Signed By: Crissie Reese, MD     August 10, 2018

## 2018-08-21 NOTE — Other (Signed)
Called patient to update her chart. Patient stated that she will come in early next week to have her pre-admit testing completed. SAGE prep reviewed. Will pick it up at pre-admit testing appt. Patient denied sleep apnea. Patient does not meet criteria for spec pop. On posting sheet- it stated that Dr Terracina would manage patient diabetic medications the day before and the day of sx. P_AT phone appt completed.

## 2018-08-21 NOTE — Other (Signed)
WBC count faxed to Dr. Terracina's office.

## 2018-08-21 NOTE — Interval H&P Note (Signed)
WBC count faxed to Dr. Tedra Coupe office.

## 2018-08-21 NOTE — Interval H&P Note (Signed)
Called patient to update her chart. Patient stated that she will come in early next week to have her pre-admit testing completed. SAGE prep reviewed. Will pick it up at pre-admit testing appt. Patient denied sleep apnea. Patient does not meet criteria for spec pop. On posting sheet- it stated that Dr Shanon Brow would manage patient diabetic medications the day before and the day of sx. P_AT phone appt completed.

## 2018-08-22 ENCOUNTER — Inpatient Hospital Stay: Payer: MEDICAID | Primary: Family Medicine

## 2018-08-27 ENCOUNTER — Inpatient Hospital Stay: Admit: 2018-08-27 | Primary: Family Medicine

## 2018-08-27 ENCOUNTER — Inpatient Hospital Stay: Admit: 2018-08-27 | Payer: MEDICAID | Primary: Family Medicine

## 2018-08-27 DIAGNOSIS — Z01818 Encounter for other preprocedural examination: Secondary | ICD-10-CM

## 2018-08-27 LAB — TYPE AND SCREEN
ABO/Rh: O POS
Antibody Screen: NEGATIVE

## 2018-08-27 LAB — TYPE & SCREEN
ABO/Rh(D): O POS
Antibody screen: NEGATIVE

## 2018-08-27 LAB — LABCORP SPECIMEN COLLECTION

## 2018-08-28 LAB — CBC WITH AUTO DIFFERENTIAL
Basophils %: 1 %
Basophils Absolute: 0.1 10*3/uL (ref 0.0–0.2)
Eosinophils %: 1 %
Eosinophils Absolute: 0.2 10*3/uL (ref 0.0–0.4)
Granulocyte Absolute Count: 0 10*3/uL (ref 0.0–0.1)
Hematocrit: 39.5 % (ref 34.0–46.6)
Hemoglobin: 13.9 g/dL (ref 11.1–15.9)
Immature Granulocytes: 0 %
Lymphocytes %: 23 %
Lymphocytes Absolute: 3.8 10*3/uL — ABNORMAL HIGH (ref 0.7–3.1)
MCH: 29.8 pg (ref 26.6–33.0)
MCHC: 35.2 g/dL (ref 31.5–35.7)
MCV: 85 fL (ref 79–97)
Monocytes %: 5 %
Monocytes Absolute: 0.8 10*3/uL (ref 0.1–0.9)
Neutrophils %: 70 %
Neutrophils Absolute: 11.5 10*3/uL — ABNORMAL HIGH (ref 1.4–7.0)
Platelets: 336 10*3/uL (ref 150–450)
RBC: 4.66 x10E6/uL (ref 3.77–5.28)
RDW: 14.2 % (ref 12.3–15.4)
WBC: 16.4 10*3/uL — ABNORMAL HIGH (ref 3.4–10.8)

## 2018-08-28 LAB — COMPREHENSIVE METABOLIC PANEL
ALT: 23 IU/L (ref 0–32)
AST: 16 IU/L (ref 0–40)
Albumin/Globulin Ratio: 1.6 NA (ref 1.2–2.2)
Albumin: 4.1 g/dL (ref 3.5–5.5)
Alkaline Phosphatase: 65 IU/L (ref 39–117)
BUN: 10 mg/dL (ref 6–20)
Bun/Cre Ratio: 18 NA (ref 9–23)
CO2: 20 mmol/L (ref 20–29)
Calcium: 9.4 mg/dL (ref 8.7–10.2)
Chloride: 101 mmol/L (ref 96–106)
Creatinine: 0.55 mg/dL — ABNORMAL LOW (ref 0.57–1.00)
EGFR IF NonAfrican American: 135 mL/min/{1.73_m2} (ref >59)
GFR African American: 156 mL/min/{1.73_m2} (ref >59)
Globulin, Total: 2.6 g/dL (ref 1.5–4.5)
Glucose: 210 mg/dL — ABNORMAL HIGH (ref 65–99)
Potassium: 4.3 mmol/L (ref 3.5–5.2)
Sodium: 138 mmol/L (ref 134–144)
Total Bilirubin: 0.5 mg/dL (ref 0.0–1.2)
Total Protein: 6.7 g/dL (ref 6.0–8.5)

## 2018-08-28 LAB — HCG QL SERUM
HCG,BETA SUBUNIT,QUAL,SERUM, 004556: NEGATIVE m[IU]/mL (ref <6)
hCG,Beta Subunit,Ql.: NEGATIVE m[IU]/mL (ref <6)

## 2018-08-28 LAB — EKG 12-LEAD
Atrial Rate: 83 {beats}/min
Diagnosis: NORMAL
P Axis: 20 degrees
P-R Interval: 146 ms
Q-T Interval: 358 ms
QRS Duration: 82 ms
QTc Calculation (Bazett): 420 ms
R Axis: 34 degrees
T Axis: 30 degrees
Ventricular Rate: 83 {beats}/min

## 2018-08-28 LAB — HEMOGLOBIN A1C W/EAG
Hemoglobin A1C: 8.4 % — ABNORMAL HIGH (ref 4.8–5.6)
eAG: 194 mg/dL

## 2018-08-28 LAB — CBC WITH AUTOMATED DIFF
ABS. BASOPHILS: 0.1 10*3/uL (ref 0.0–0.2)
ABS. EOSINOPHILS: 0.2 10*3/uL (ref 0.0–0.4)
ABS. IMM. GRANS.: 0 10*3/uL (ref 0.0–0.1)
ABS. MONOCYTES: 0.8 10*3/uL (ref 0.1–0.9)
ABS. NEUTROPHILS: 11.5 10*3/uL — ABNORMAL HIGH (ref 1.4–7.0)
Abs Lymphocytes: 3.8 10*3/uL — ABNORMAL HIGH (ref 0.7–3.1)
BASOPHILS: 1 %
EOSINOPHILS: 1 %
HCT: 39.5 % (ref 34.0–46.6)
HGB: 13.9 g/dL (ref 11.1–15.9)
IMMATURE GRANULOCYTES: 0 %
Lymphocytes: 23 %
MCH: 29.8 pg (ref 26.6–33.0)
MCHC: 35.2 g/dL (ref 31.5–35.7)
MCV: 85 fL (ref 79–97)
MONOCYTES: 5 %
NEUTROPHILS: 70 %
PLATELET: 336 10*3/uL (ref 150–450)
RBC: 4.66 x10E6/uL (ref 3.77–5.28)
RDW: 14.2 % (ref 12.3–15.4)
WBC: 16.4 10*3/uL — ABNORMAL HIGH (ref 3.4–10.8)

## 2018-08-28 LAB — METABOLIC PANEL, COMPREHENSIVE
A-G Ratio: 1.6 (ref 1.2–2.2)
ALT (SGPT): 23 IU/L (ref 0–32)
AST (SGOT): 16 IU/L (ref 0–40)
Albumin: 4.1 g/dL (ref 3.5–5.5)
Alk. phosphatase: 65 IU/L (ref 39–117)
BUN/Creatinine ratio: 18 (ref 9–23)
BUN: 10 mg/dL (ref 6–20)
Bilirubin, total: 0.5 mg/dL (ref 0.0–1.2)
CO2: 20 mmol/L (ref 20–29)
Calcium: 9.4 mg/dL (ref 8.7–10.2)
Chloride: 101 mmol/L (ref 96–106)
Creatinine: 0.55 mg/dL — ABNORMAL LOW (ref 0.57–1.00)
GFR est AA: 156 mL/min/{1.73_m2} (ref >59)
GFR est non-AA: 135 mL/min/{1.73_m2} (ref >59)
GLOBULIN, TOTAL: 2.6 g/dL (ref 1.5–4.5)
Glucose: 210 mg/dL — ABNORMAL HIGH (ref 65–99)
Potassium: 4.3 mmol/L (ref 3.5–5.2)
Protein, total: 6.7 g/dL (ref 6.0–8.5)
Sodium: 138 mmol/L (ref 134–144)

## 2018-08-28 LAB — EKG, 12 LEAD, INITIAL
Atrial Rate: 83 {beats}/min
Calculated P Axis: 20 degrees
Calculated R Axis: 34 degrees
Calculated T Axis: 30 degrees
Diagnosis: NORMAL
P-R Interval: 146 ms
Q-T Interval: 358 ms
QRS Duration: 82 ms
QTC Calculation (Bezet): 420 ms
Ventricular Rate: 83 {beats}/min

## 2018-08-28 LAB — HEMOGLOBIN A1C WITH EAG
Estimated average glucose: 194 mg/dL
Hemoglobin A1c: 8.4 % — ABNORMAL HIGH (ref 4.8–5.6)

## 2018-08-31 ENCOUNTER — Ambulatory Visit

## 2018-08-31 ENCOUNTER — Ambulatory Visit: Admit: 2018-08-31 | Discharge: 2018-08-31 | Payer: PRIVATE HEALTH INSURANCE | Primary: Family Medicine

## 2018-08-31 NOTE — Progress Notes (Signed)
Pt scheduled for sleeve resection tomorrow AM    Pt called from office phone at 1023 (left message)    Pt called in response to WBC of 16.4K drawn 4 days ago    Asked pt to call office to discuss findings and possible source of elevated WBC

## 2018-08-31 NOTE — Progress Notes (Signed)
Patient attended bariatric surgery pre-operative education class today. Patient appeared to have an understanding of the surgical procedure, pre-op and post-op risks, and lifestyle changes.  Patient asked appropriate questions, and all questions were answered in their entirety.  This RN read Patient Contract for Bariatric Surgery to patient, as they read along using their own copy, and patient initialed, signed, and dated contract during this time.

## 2018-08-31 NOTE — Progress Notes (Signed)
Patient attended pre-operative education class. Appears to have a good understanding of the diet progression, food choices, and dietary/exercise habits for successful weight loss and nourishment after surgery. The class material included: post-op diet progression, including fluid needs, appropriate liquid choices, protein supplements, and behavior modifications recommended post-operatively.  Patient previously received education booklet, during today's class the contents and appropriate sections were reviewed. Utilized education check points, class discussion, and teach back method to reinforce/ review appropriate dietary and exercise habits for optimal weight loss following surgery.    Provided food list, example diet, and resources from class. Patient has contact information for any further questions or concerns.   Will continue following post surgery for diet advancement.    Signed By: Kirby E Moir     August 31, 2018

## 2018-08-31 NOTE — H&P (View-Only) (Signed)
Pt scheduled for sleeve resection tomorrow AM    Pt called from office phone at [phone] (left message)    Pt called in response to WBC of 16.4K drawn 4 days ago    Asked pt to call office to discuss findings and possible source of elevated WBC

## 2018-08-31 NOTE — Progress Notes (Signed)
Patient attended pre-operative education class. Appears to have a good understanding of the diet progression, food choices, and dietary/exercise habits for successful weight loss and nourishment after surgery. The class material included: post-op diet progression, including fluid needs, appropriate liquid choices, protein supplements, and behavior modifications recommended post-operatively.  Patient previously received education booklet, during today's class the contents and appropriate sections were reviewed. Utilized education check points, class discussion, and teach back method to reinforce/ review appropriate dietary and exercise habits for optimal weight loss following surgery.    Provided food list, example diet, and resources from class. Patient has contact information for any further questions or concerns.   Will continue following post surgery for diet advancement.    Signed By: Jamey Reas     August 31, 2018

## 2018-08-31 NOTE — Progress Notes (Signed)
Pt scheduled for sleeve resection tomorrow AM    Pt called from office phone at 1023 (left message)    Pt called in response to WBC of 16.4K drawn 4 days ago    Asked pt to call office to discuss findings and possible source of elevated WBC

## 2018-09-01 ENCOUNTER — Inpatient Hospital Stay
Admit: 2018-09-01 | Discharge: 2018-09-02 | Disposition: A | Discharge status: Home / Self Care | Payer: MEDICAID | Attending: Specialist | Admitting: Specialist

## 2018-09-01 LAB — POCT GLUCOSE
POC Glucose: 202 mg/dL — ABNORMAL HIGH (ref 70–110)
POC Glucose: 132 mg/dL — ABNORMAL HIGH (ref 70–110)
POC Glucose: 197 mg/dL — ABNORMAL HIGH (ref 70–110)
POC Glucose: 198 mg/dL — ABNORMAL HIGH (ref 70–110)

## 2018-09-01 LAB — HCG URINE, QL. - POC
HCG, Pregnancy, Urine, POC: NEGATIVE
Pregnancy test,urine (POC): NEGATIVE

## 2018-09-01 LAB — GLUCOSE, POC
Glucose (POC): 202 mg/dL — ABNORMAL HIGH (ref 70–110)
Glucose (POC): 132 mg/dL — ABNORMAL HIGH (ref 70–110)
Glucose (POC): 197 mg/dL — ABNORMAL HIGH (ref 70–110)
Glucose (POC): 198 mg/dL — ABNORMAL HIGH (ref 70–110)

## 2018-09-01 MED ORDER — KETOROLAC TROMETHAMINE 30 MG/ML INJECTION
30 mg/mL (1 mL) | INTRAMUSCULAR | Status: AC
Start: 2018-09-01 — End: ?

## 2018-09-01 MED ORDER — MIDAZOLAM (PF) 1 MG/ML INJECTION SOLUTION
1 mg/mL | INTRAMUSCULAR | Status: AC
Start: 2018-09-01 — End: ?

## 2018-09-01 MED ORDER — ACETAMINOPHEN 1,000 MG/100 ML (10 MG/ML) IV
1000 mg/100 mL (10 mg/mL) | Freq: Once | INTRAVENOUS | Status: AC
Start: 2018-09-01 — End: 2018-09-01
  Administered 2018-09-01: 16:00:00 via INTRAVENOUS

## 2018-09-01 MED ORDER — METOCLOPRAMIDE 5 MG/ML IJ SOLN
5 mg/mL | Freq: Four times a day (QID) | INTRAMUSCULAR | Status: DC
Start: 2018-09-01 — End: 2018-09-01

## 2018-09-01 MED ORDER — ROCURONIUM 10 MG/ML IV
10 mg/mL | INTRAVENOUS | Status: DC | PRN
Start: 2018-09-01 — End: 2018-09-01
  Administered 2018-09-01 (×2): via INTRAVENOUS

## 2018-09-01 MED ORDER — PANTOPRAZOLE 40 MG IV SOLR
40 mg | Freq: Every day | INTRAVENOUS | Status: DC
Start: 2018-09-01 — End: 2018-09-02
  Administered 2018-09-02: 14:00:00 via INTRAVENOUS

## 2018-09-01 MED ORDER — LACTATED RINGERS IV
INTRAVENOUS | Status: DC
Start: 2018-09-01 — End: 2018-09-02
  Administered 2018-09-01 – 2018-09-02 (×3): via INTRAVENOUS

## 2018-09-01 MED ORDER — ENOXAPARIN 40 MG/0.4 ML SUB-Q SYRINGE
40 mg/0.4 mL | Freq: Once | SUBCUTANEOUS | Status: AC
Start: 2018-09-01 — End: 2018-09-01
  Administered 2018-09-01: 14:00:00 via SUBCUTANEOUS

## 2018-09-01 MED ORDER — MORPHINE 10 MG/ML INJ SOLUTION
10 mg/ml | INTRAMUSCULAR | Status: DC | PRN
Start: 2018-09-01 — End: 2018-09-02
  Administered 2018-09-01: 22:00:00 via INTRAVENOUS

## 2018-09-01 MED ORDER — NEOSTIGMINE METHYLSULFATE 3 MG/3 ML (1 MG/ML) IV SYRINGE
3 mg/ mL (1 mg/mL) | INTRAVENOUS | Status: AC
Start: 2018-09-01 — End: ?

## 2018-09-01 MED ORDER — ONDANSETRON (PF) 4 MG/2 ML INJECTION
4 mg/2 mL | INTRAMUSCULAR | Status: AC
Start: 2018-09-01 — End: ?

## 2018-09-01 MED ORDER — PROPOFOL 10 MG/ML IV EMUL
10 mg/mL | INTRAVENOUS | Status: DC | PRN
Start: 2018-09-01 — End: 2018-09-01
  Administered 2018-09-01: 16:00:00 via INTRAVENOUS

## 2018-09-01 MED ORDER — ROCURONIUM 10 MG/ML IV
10 mg/mL | INTRAVENOUS | Status: AC
Start: 2018-09-01 — End: ?

## 2018-09-01 MED ORDER — PHENYLEPHRINE 1 MG/10 ML (100 MCG/ML) IN NS IV SYRINGE
1 mg/0 mL (00 mcg/mL) | INTRAVENOUS | Status: DC | PRN
Start: 2018-09-01 — End: 2018-09-01
  Administered 2018-09-01: 16:00:00 via INTRAVENOUS

## 2018-09-01 MED ORDER — FENTANYL CITRATE (PF) 50 MCG/ML IJ SOLN
50 mcg/mL | INTRAMUSCULAR | Status: DC | PRN
Start: 2018-09-01 — End: 2018-09-01

## 2018-09-01 MED ORDER — LIDOCAINE (PF) 20 MG/ML (2 %) IJ SOLN
20 mg/mL (2 %) | INTRAMUSCULAR | Status: AC
Start: 2018-09-01 — End: ?

## 2018-09-01 MED ORDER — DEXTROSE 10% IN WATER (D10W) IV
10 % | INTRAVENOUS | Status: DC | PRN
Start: 2018-09-01 — End: 2018-09-01

## 2018-09-01 MED ORDER — HYDROMORPHONE (PF) 2 MG/ML IJ SOLN
2 mg/mL | INTRAMUSCULAR | Status: DC | PRN
Start: 2018-09-01 — End: 2018-09-01

## 2018-09-01 MED ORDER — GLYCOPYRROLATE 0.2 MG/ML IJ SOLN
0.2 mg/mL | INTRAMUSCULAR | Status: AC
Start: 2018-09-01 — End: ?

## 2018-09-01 MED ORDER — GLUCAGON 1 MG INJECTION
1 mg | INTRAMUSCULAR | Status: DC | PRN
Start: 2018-09-01 — End: 2018-09-02

## 2018-09-01 MED ORDER — FAMOTIDINE 20 MG IN 50 ML NS IVPB
20 mg/50 mL | INTRAVENOUS | Status: DC
Start: 2018-09-01 — End: 2018-09-01

## 2018-09-01 MED ORDER — KETOROLAC TROMETHAMINE 15 MG/ML INJECTION
15 mg/mL | INTRAMUSCULAR | Status: DC | PRN
Start: 2018-09-01 — End: 2018-09-01
  Administered 2018-09-01: 17:00:00 via INTRAVENOUS

## 2018-09-01 MED ORDER — PROPOFOL 10 MG/ML IV EMUL
10 mg/mL | INTRAVENOUS | Status: AC
Start: 2018-09-01 — End: ?

## 2018-09-01 MED ORDER — NEOSTIGMINE METHYLSULFATE 3 MG/3 ML (1 MG/ML) IV SYRINGE
3 mg/ mL (1 mg/mL) | INTRAVENOUS | Status: DC | PRN
Start: 2018-09-01 — End: 2018-09-01
  Administered 2018-09-01: 17:00:00 via INTRAVENOUS

## 2018-09-01 MED ORDER — ONDANSETRON (PF) 4 MG/2 ML INJECTION
4 mg/2 mL | INTRAMUSCULAR | Status: DC | PRN
Start: 2018-09-01 — End: 2018-09-01
  Administered 2018-09-01: 17:00:00 via INTRAVENOUS

## 2018-09-01 MED ORDER — ENOXAPARIN 40 MG/0.4 ML SUB-Q SYRINGE
40 mg/0.4 mL | Freq: Two times a day (BID) | SUBCUTANEOUS | Status: DC
Start: 2018-09-01 — End: 2018-09-02
  Administered 2018-09-02 (×2): via SUBCUTANEOUS

## 2018-09-01 MED ORDER — CEFAZOLIN 2 GM/50 ML IN DEXTROSE (ISO-OSMOTIC) IVPB
2 gram/50 mL | Freq: Three times a day (TID) | INTRAVENOUS | Status: AC
Start: 2018-09-01 — End: 2018-09-02
  Administered 2018-09-01 – 2018-09-02 (×2): via INTRAVENOUS

## 2018-09-01 MED ORDER — CEFAZOLIN 3 GRAM/20 ML IN STERILE WATER INTRAVENOUS SYRINGE
3 gram/20 mL | Freq: Three times a day (TID) | INTRAVENOUS | Status: DC
Start: 2018-09-01 — End: 2018-09-01

## 2018-09-01 MED ORDER — SODIUM CHLORIDE 0.9 % IJ SYRG
Freq: Three times a day (TID) | INTRAMUSCULAR | Status: DC
Start: 2018-09-01 — End: 2018-09-01

## 2018-09-01 MED ORDER — LIDOCAINE (PF) 20 MG/ML (2 %) IJ SOLN
20 mg/mL (2 %) | INTRAMUSCULAR | Status: DC | PRN
Start: 2018-09-01 — End: 2018-09-01
  Administered 2018-09-01: 16:00:00 via INTRAVENOUS

## 2018-09-01 MED ORDER — MIDAZOLAM 1 MG/ML IJ SOLN
1 mg/mL | INTRAMUSCULAR | Status: DC | PRN
Start: 2018-09-01 — End: 2018-09-01
  Administered 2018-09-01: 16:00:00 via INTRAVENOUS

## 2018-09-01 MED ORDER — SODIUM CHLORIDE 0.9 % INJECTION
202 mg/2 mL | Freq: Once | INTRAMUSCULAR | Status: AC
Start: 2018-09-01 — End: 2018-09-01
  Administered 2018-09-01: 14:00:00 via INTRAVENOUS

## 2018-09-01 MED ORDER — GLUCOSE 4 GRAM CHEWABLE TAB
4 gram | ORAL | Status: DC | PRN
Start: 2018-09-01 — End: 2018-09-01

## 2018-09-01 MED ORDER — BUPIVACAINE-EPINEPHRINE (PF) 0.25 %-1:200,000 IJ SOLN
0.25 %-1:200,000 | INTRAMUSCULAR | Status: AC
Start: 2018-09-01 — End: ?

## 2018-09-01 MED ORDER — GLYCOPYRROLATE 0.2 MG/ML IJ SOLN
0.2 mg/mL | INTRAMUSCULAR | Status: DC | PRN
Start: 2018-09-01 — End: 2018-09-01
  Administered 2018-09-01: 17:00:00 via INTRAVENOUS

## 2018-09-01 MED ORDER — METOCLOPRAMIDE 5 MG/ML IJ SOLN
5 mg/mL | Freq: Four times a day (QID) | INTRAMUSCULAR | Status: DC
Start: 2018-09-01 — End: 2018-09-02
  Administered 2018-09-01 – 2018-09-02 (×4): via INTRAVENOUS

## 2018-09-01 MED ORDER — FLUMAZENIL 0.1 MG/ML IV SOLN
0.1 mg/mL | INTRAVENOUS | Status: DC | PRN
Start: 2018-09-01 — End: 2018-09-01

## 2018-09-01 MED ORDER — LACTATED RINGERS IV
INTRAVENOUS | Status: DC
Start: 2018-09-01 — End: 2018-09-01

## 2018-09-01 MED ORDER — NYSTATIN 100,000 UNIT/ML ORAL SUSP
100000 unit/mL | ORAL | Status: AC
Start: 2018-09-01 — End: 2018-09-01
  Administered 2018-09-01: 14:00:00 via ORAL

## 2018-09-01 MED ORDER — ACETAMINOPHEN 1,000 MG/100 ML (10 MG/ML) IV
1000 mg/100 mL (10 mg/mL) | Freq: Four times a day (QID) | INTRAVENOUS | Status: AC
Start: 2018-09-01 — End: 2018-09-02
  Administered 2018-09-01 – 2018-09-02 (×3): via INTRAVENOUS

## 2018-09-01 MED ORDER — INSULIN LISPRO 100 UNIT/ML INJECTION
100 unit/mL | Freq: Once | SUBCUTANEOUS | Status: AC
Start: 2018-09-01 — End: 2018-09-01
  Administered 2018-09-01: 18:00:00 via SUBCUTANEOUS

## 2018-09-01 MED ORDER — NALOXONE 0.4 MG/ML INJECTION
0.4 mg/mL | INTRAMUSCULAR | Status: DC | PRN
Start: 2018-09-01 — End: 2018-09-01

## 2018-09-01 MED ORDER — FENTANYL CITRATE (PF) 50 MCG/ML IJ SOLN
50 mcg/mL | INTRAMUSCULAR | Status: AC
Start: 2018-09-01 — End: ?

## 2018-09-01 MED ORDER — SODIUM CHLORIDE 0.9 % IJ SYRG
INTRAMUSCULAR | Status: DC | PRN
Start: 2018-09-01 — End: 2018-09-01

## 2018-09-01 MED ORDER — ONDANSETRON (PF) 4 MG/2 ML INJECTION
4 mg/2 mL | Freq: Four times a day (QID) | INTRAMUSCULAR | Status: DC | PRN
Start: 2018-09-01 — End: 2018-09-02

## 2018-09-01 MED ORDER — NALOXONE 0.4 MG/ML INJECTION
0.4 mg/mL | INTRAMUSCULAR | Status: DC | PRN
Start: 2018-09-01 — End: 2018-09-02

## 2018-09-01 MED ORDER — HYDROMORPHONE (PF) 2 MG/ML IJ SOLN
2 mg/mL | INTRAMUSCULAR | Status: DC | PRN
Start: 2018-09-01 — End: 2018-09-01
  Administered 2018-09-01 (×2): via INTRAVENOUS

## 2018-09-01 MED ORDER — CEFAZOLIN 2 GRAM/20 ML IN STERILE WATER INTRAVENOUS SYRINGE
2 gram/0 mL | Freq: Once | INTRAVENOUS | Status: AC
Start: 2018-09-01 — End: 2018-09-01
  Administered 2018-09-01: 16:00:00 via INTRAVENOUS

## 2018-09-01 MED ORDER — LACTATED RINGERS IV
INTRAVENOUS | Status: DC
Start: 2018-09-01 — End: 2018-09-01
  Administered 2018-09-01 (×3): via INTRAVENOUS

## 2018-09-01 MED ORDER — BUPIVACAINE-EPINEPHRINE (PF) 0.25 %-1:200,000 IJ SOLN
0.25 %-1:200,000 | INTRAMUSCULAR | Status: DC | PRN
Start: 2018-09-01 — End: 2018-09-01
  Administered 2018-09-01: 16:00:00 via SUBCUTANEOUS

## 2018-09-01 MED ORDER — INSULIN LISPRO 100 UNIT/ML INJECTION
100 unit/mL | Freq: Four times a day (QID) | SUBCUTANEOUS | Status: DC
Start: 2018-09-01 — End: 2018-09-02
  Administered 2018-09-01: 23:00:00 via SUBCUTANEOUS

## 2018-09-01 MED ORDER — GLUCAGON 1 MG INJECTION
1 mg | INTRAMUSCULAR | Status: DC | PRN
Start: 2018-09-01 — End: 2018-09-01

## 2018-09-01 MED ORDER — KETOROLAC TROMETHAMINE 30 MG/ML INJECTION
30 mg/mL (1 mL) | Freq: Four times a day (QID) | INTRAMUSCULAR | Status: DC
Start: 2018-09-01 — End: 2018-09-02
  Administered 2018-09-01 – 2018-09-02 (×4): via INTRAVENOUS

## 2018-09-01 MED ORDER — PHENYLEPHRINE 1 MG/10 ML (100 MCG/ML) IN NS IV SYRINGE
1 mg/0 mL (00 mcg/mL) | INTRAVENOUS | Status: AC
Start: 2018-09-01 — End: ?

## 2018-09-01 MED ORDER — HYDROMORPHONE (PF) 1 MG/ML IJ SOLN
1 mg/mL | INTRAMUSCULAR | Status: AC
Start: 2018-09-01 — End: ?

## 2018-09-01 MED ORDER — INSULIN REGULAR HUMAN 100 UNIT/ML INJECTION
100 unit/mL | Freq: Once | INTRAMUSCULAR | Status: AC
Start: 2018-09-01 — End: 2018-09-01
  Administered 2018-09-01: 14:00:00 via INTRAVENOUS

## 2018-09-01 MED ORDER — FENTANYL CITRATE (PF) 50 MCG/ML IJ SOLN
50 mcg/mL | INTRAMUSCULAR | Status: DC | PRN
Start: 2018-09-01 — End: 2018-09-01
  Administered 2018-09-01: 16:00:00 via INTRAVENOUS

## 2018-09-01 MED FILL — MIDAZOLAM (PF) 1 MG/ML INJECTION SOLUTION: 1 mg/mL | INTRAMUSCULAR | Qty: 2

## 2018-09-01 MED FILL — BD POSIFLUSH NORMAL SALINE 0.9 % INJECTION SYRINGE: INTRAMUSCULAR | Qty: 40

## 2018-09-01 MED FILL — NEOSTIGMINE METHYLSULFATE 3 MG/3 ML (1 MG/ML) IV SYRINGE: 3 mg/ mL (1 mg/mL) | INTRAVENOUS | Qty: 3

## 2018-09-01 MED FILL — LACTATED RINGERS IV: INTRAVENOUS | Qty: 1000

## 2018-09-01 MED FILL — PHENYLEPHRINE 1 MG/10 ML (100 MCG/ML) IN NS IV SYRINGE: 1 mg/0 mL (00 mcg/mL) | INTRAVENOUS | Qty: 10

## 2018-09-01 MED FILL — ROCURONIUM 10 MG/ML IV: 10 mg/mL | INTRAVENOUS | Qty: 5

## 2018-09-01 MED FILL — FENTANYL CITRATE (PF) 50 MCG/ML IJ SOLN: 50 mcg/mL | INTRAMUSCULAR | Qty: 2

## 2018-09-01 MED FILL — CEFAZOLIN 2 GM/50 ML IN DEXTROSE (ISO-OSMOTIC) IVPB: 2 gram/50 mL | INTRAVENOUS | Qty: 50

## 2018-09-01 MED FILL — NYSTATIN 100,000 UNIT/ML ORAL SUSP: 100000 unit/mL | ORAL | Qty: 5

## 2018-09-01 MED FILL — METOCLOPRAMIDE 5 MG/ML IJ SOLN: 5 mg/mL | INTRAMUSCULAR | Qty: 2

## 2018-09-01 MED FILL — KETOROLAC TROMETHAMINE 30 MG/ML INJECTION: 30 mg/mL (1 mL) | INTRAMUSCULAR | Qty: 1

## 2018-09-01 MED FILL — MORPHINE 10 MG/ML SYRINGE: 10 mg/mL | INTRAMUSCULAR | Qty: 1

## 2018-09-01 MED FILL — PROPOFOL 10 MG/ML IV EMUL: 10 mg/mL | INTRAVENOUS | Qty: 20

## 2018-09-01 MED FILL — ONDANSETRON (PF) 4 MG/2 ML INJECTION: 4 mg/2 mL | INTRAMUSCULAR | Qty: 2

## 2018-09-01 MED FILL — OFIRMEV 1,000 MG/100 ML (10 MG/ML) INTRAVENOUS SOLUTION: 1000 mg/100 mL (10 mg/mL) | INTRAVENOUS | Qty: 100

## 2018-09-01 MED FILL — HYDROMORPHONE (PF) 1 MG/ML IJ SOLN: 1 mg/mL | INTRAMUSCULAR | Qty: 1

## 2018-09-01 MED FILL — SENSORCAINE-MPF/EPINEPHRINE 0.25 %-1:200,000 INJECTION SOLUTION: 0.25 %-1:200,000 | INTRAMUSCULAR | Qty: 10

## 2018-09-01 MED FILL — FAMOTIDINE (PF) 20 MG/2 ML IV: 20 mg/2 mL | INTRAVENOUS | Qty: 2

## 2018-09-01 MED FILL — ENOXAPARIN 40 MG/0.4 ML SUB-Q SYRINGE: 40 mg/0.4 mL | SUBCUTANEOUS | Qty: 0.4

## 2018-09-01 MED FILL — GLYCOPYRROLATE 0.2 MG/ML IJ SOLN: 0.2 mg/mL | INTRAMUSCULAR | Qty: 4

## 2018-09-01 MED FILL — XYLOCAINE-MPF 20 MG/ML (2 %) INJECTION SOLUTION: 20 mg/mL (2 %) | INTRAMUSCULAR | Qty: 10

## 2018-09-01 MED FILL — INSULIN LISPRO 100 UNIT/ML INJECTION: 100 unit/mL | SUBCUTANEOUS | Qty: 2

## 2018-09-01 MED FILL — CEFAZOLIN 2 GRAM/20 ML IN STERILE WATER INTRAVENOUS SYRINGE: 2 gram/0 mL | INTRAVENOUS | Qty: 20

## 2018-09-01 MED FILL — INSULIN LISPRO 100 UNIT/ML INJECTION: 100 unit/mL | SUBCUTANEOUS | Qty: 1

## 2018-09-01 NOTE — Other (Signed)
Room 327, family updated.

## 2018-09-01 NOTE — Nurse Consult (Signed)
Patient dozing in and out of sleep throughout visit.  Patient complained of expected pain with mild nausea.      Vitals:   Visit Vitals  BP 129/84   Pulse 88   Temp 97.5 ??F (36.4 ??C)   Resp 12   Ht 5' 7" (1.702 m)   Wt 111.3 kg (245 lb 7 oz)   SpO2 100%   BMI 38.44 kg/m??     General: Alert & oriented to person, place, time, and situation  Pulmonary: Lungs clear to auscultation in all fields.  Cardiac: Regular rate and rhythm  Abdomen: Appropriate tenderness; soft; non-distended; hypo-active bowel sounds  Lap sites: Within normal limits  JP drain: No drainage noted   SCD's: In place and operating WNL    Plan:  -Continue medications for symptom management  -Encourage ambulation  -SCD use when in bed  -IS 10 times an hour  -NPO  -UGI in AM; bariatric full liquid diet  if UGI within normal limits    Plan was discussed with patient, as well as IS teaching provided.  She verbalized understanding to plan and education.

## 2018-09-01 NOTE — Other (Signed)
Notified Leigh, RN of 5 Units of Regular insulin given and to recheck FSBS at [phone].

## 2018-09-01 NOTE — Other (Signed)
Primary Nurse Sherry Sindle, RN and Aubrey, RN performed a dual skin assessment on this patient Impairment noted- see wound doc flow sheet.    Reviewed PTA medication list with patient/caregiver and patient/caregiver denies any additional medications. Patient admits to having a responsible adult care for them for at least 24 hours after surgery.  ??

## 2018-09-01 NOTE — Anesthesia Pre-Procedure Evaluation (Addendum)
Relevant Problems   No relevant active problems       Anesthetic History   No history of anesthetic complications            Review of Systems / Medical History  Patient summary reviewed, nursing notes reviewed and pertinent labs reviewed    Pulmonary  Within defined limits                 Neuro/Psych   Within defined limits           Cardiovascular    Hypertension              Exercise tolerance: >4 METS     GI/Hepatic/Renal  Within defined limits              Endo/Other    Diabetes    Morbid obesity     Other Findings              Physical Exam    Airway  Mallampati: II  TM Distance: 4 - 6 cm  Neck ROM: normal range of motion   Mouth opening: Normal     Cardiovascular    Rhythm: regular  Rate: normal         Dental  No notable dental hx       Pulmonary  Breath sounds clear to auscultation               Abdominal  GI exam deferred       Other Findings            Anesthetic Plan    ASA: 3  Anesthesia type: general          Induction: Intravenous  Anesthetic plan and risks discussed with: Patient

## 2018-09-01 NOTE — Op Note (Signed)
OPERATIVE REPORT         Patient:Alexandria Vasquez   DOB: Jan 23, 1996  Medical Record [id number]    Pre-operative Diagnosis:  HYPERTENSION, DIABETES, MORBID OBESITY, BMI 41, FATTY LIVER  Post-operative Diagnosis: HYPERTENSION, DIABETES, MORBID OBESITY, BMI 41, FATTY LIVER  Procedure: Procedure(s):  1.LAPAROSCOPIC GASTRIC SLEEVE  2. WEDGE LIVER BIOPSY  3.DIAPHRAGMATIC HERNIA REPAIR  4. INTRAOPERATIVE ENDOSCOPY WITH BIOPSY  Location: [facility]  Surgeon: Crissie Reese, MD  Assistant:  Nadara Mustard [facility]  - (there are no qualified interns, residents, or any other qualified house   physicians available to assist with this surgery) performed retraction of various structures,  assisted in   creation of the gastric sleeve, fired stapling devices, obtained hemostasis along staple lines via hemoclips,       Anesthesia: General       Specimens: 1. Gastric Sleeve Resection                       2. Liver Wedge Biopsy    EBL: less than 5cc  Additional Findings: None  Complications: None             STATEMENT OF MEDICAL NECESSITY: The patient is a 23 y.o.-year-old female who has   had a history of obesity. she has failed conservative weight loss measures,   such began to consider weight loss surgical options. she chose the   sleeve gastrectomy as a means of surgical weight control. she has undergone   nutritional and psychological teaching at this time period and does wish to proceed   with sleeve gastrectomy.     OPERATIVE PROCEDURE: The patient was brought to the operating room, placed   on the table in supine position at which time general  anesthesia was administered   without any difficulty. The abdomen was then prepped and draped in the   usual sterile fashion. Using a 15 blade, a 1 cm incision was made just to the   left of the umbilicus. The veress needle approach was used to gain access to   the peritoneal cavity which was then insufflated. The Visi-Port was then placed    at that site,then 4 additional trocars were placed in the usual U-shaped   configuration with a subxiphoid incision being made to accommodate the   [facility] retractor.  On entering the abdomen, the patient was noted to have a   moderately fatty liver with possible evidence of early steatohepatitis. I elevated the liver and noted the   patient had a diaphragmatic hernia present. I began the operation by choosing an area 2-3 cm   proximal to the pylorus and within the gastroepiploic vessels I began to divide off these vessels individually.   I moved cephalad toward short gastric vessels, which were very difficult to take down due to the proximity   to the splenic hilum. I was able to do so, clearing the entire left crural area. I then placed a Visigi tube,   impacting at the distal antrum. I then began the resection with the powered Echelon   stapler using the green loads for the first firing tangential along the   antral region. The second and subsequent firings were used with blue  reloads  reaching just past the incisura region. The remainder of the 4 vertical   firings completed the resection at the left crural region. I then tested   the pouch via the Visigi tube using dilute methylene blue,it was noted to be completely  Watertight.  I then left the operative field and proceeded to the the head of the bed and performed an   intraoperative EGD.  The scope was passed successfully into the gastric sleeve to the level of the pylorus.   Biopsies were taken of this region and submitted to test both for H Pylori and for pathologic diagnosis.    There was no bleeding noted and no leak appreciated with air insufflation.  I then returned to the surgical field.    I then obtained hemostasis along the staple line using   Hemoclips and sutures where needed.  I then used 3 separate 2-0 Ethibond sutures to secure the lateral   aspect of the newly created sleeve stomach to the resected edge of the  gastrocolic omentum in an effort   to maintain the continuity of the sleeve and prevent twisting.  I then turned my attention to the diaphragmatic   repair. I then dissected out the diaphragmatic hernia and closed it using a single separate 2-0 Ethibond   sutures against the esophageal wall, taking care not to encroach upon the esophagus. I then used Eviseal   along the entire staple line to obtain hemostasis and then place Surgicel Snow over the staple line also.   With all this having been completed, I then removed the liver retractor. I performed a liver wedge biopsy of   the left lobe of the liver due to its abnormality and submitted to pathology for permanent section. I then placed   a JP drain in the left upper quadrant region along the staple line. I removed the specimen from the operative   field via the LLQ incision. I closed the left lower quadrant trocar site using a transabdominal #0 PDS   suture along the fascia, and all skin incisions were then closed using 4-0 subcuticular Monocryl. Steri-Strips   and sterile dressings were applied. The patient tolerated the procedure well.       Alexandria Vasquez M.D.

## 2018-09-01 NOTE — Other (Signed)
Patient assisted to the restroom and back to stretcher without incident.  Call bell within reach and no further needs at this time.

## 2018-09-01 NOTE — Other (Signed)
TRANSFER - IN REPORT:    Verbal report received from Cherri CRNA, OR nurse  on Alexandria Vasquez  being received from OR (unit) for routine progression of care      Report consisted of patient???s Situation, Background, Assessment and   Recommendations(SBAR).     Information from the following report(s) OR Summary, Procedure Summary, Intake/Output and MAR was reviewed with the receiving nurse.    Opportunity for questions and clarification was provided.      Assessment completed upon patient???s arrival to unit and care assumed.

## 2018-09-01 NOTE — Progress Notes (Signed)
VERIFIED WITH PATIENT THAT SHE DOES NOT USE A CPAP AT HOME.

## 2018-09-01 NOTE — Addendum Note (Signed)
Addendum  created 09/01/18 1314 by Myrene Buddy, MD    Attestation recorded in Intraprocedure, Intraprocedure Attestations filed

## 2018-09-01 NOTE — Other (Signed)
Spoke to Alexandria Vasquez in OR who spoke to Dr. Terracina abour FSBS 202, A1C 8.4 and WBC was 16.4 on 08/27/18.    Orders given for 5 units Regular Insulin IV now and recheck FSBS in 1 hour after insulin given.

## 2018-09-01 NOTE — Other (Signed)
TRANSFER - OUT REPORT:    Verbal report given to Fawn RN (name) on Jaimie Gorum  being transferred to 3 S (unit) for routine progression of care       Report consisted of patient???s Situation, Background, Assessment and   Recommendations(SBAR).     Information from the following report(s) OR Summary, Procedure Summary, Intake/Output and MAR was reviewed with the receiving nurse.    Lines:   Peripheral IV 09/01/18 Left Hand (Active)   Site Assessment Clean, dry, & intact 09/01/2018 12:50 PM   Phlebitis Assessment 0 09/01/2018 12:50 PM   Infiltration Assessment 0 09/01/2018 12:50 PM   Dressing Status Clean, dry, & intact 09/01/2018 12:50 PM   Dressing Type Transparent;Tape 09/01/2018 12:50 PM   Hub Color/Line Status Infusing 09/01/2018 12:50 PM        Opportunity for questions and clarification was provided.      Patient transported with:   O2 @ 2 liters  Registered Nurse

## 2018-09-01 NOTE — Progress Notes (Addendum)
TRANSFER - IN REPORT:    Verbal report received from Elena RN (name) on Alexandria Vasquez  being received from PACU (unit) for routine post - op      Report consisted of patient???s Situation, Background, Assessment and   Recommendations(SBAR).     Information from the following report(s) SBAR, Kardex and MAR was reviewed with the receiving nurse.    Opportunity for questions and clarification was provided.      Assessment completed upon patient???s arrival to unit and care assumed.     Primary Nurse Fawn McLean, RN and Kelsey, RN performed a dual skin assessment on this patient No impairment noted  Braden score is 21    2025- Verbal/bedside report provided to A Banks RN  Report included SBAR, Kardex and MAR.

## 2018-09-01 NOTE — Interval H&P Note (Signed)
H&P Update:  Alexandria Vasquez was seen and examined.  History and physical has been reviewed. The patient has been examined. There have been no significant clinical changes since the completion of the originally dated History and Physical.

## 2018-09-01 NOTE — Anesthesia Post-Procedure Evaluation (Signed)
Procedure(s):  LAPAROSCOPIC GASTRIC SLEEVE, WEDGE LIVER BIOPSY, HIATAL HERNIA REPAIR, AND INTRAOPERATIVE ENDOSCOPY WITH BIOPSY.    general    Anesthesia Post Evaluation        Comments: Post-Anesthesia Evaluation and Assessment    Cardiovascular Function/Vital Signs  BP 125/82    Pulse 67    Temp 36.7 ??C (98.1 ??F)    Resp 13    Ht 5' 7"  (1.702 m)    Wt 111.3 kg (245 lb 7 oz)    SpO2 100%    BMI 38.44 kg/m??     Patient is status post Procedure(s):  LAPAROSCOPIC GASTRIC SLEEVE, WEDGE LIVER BIOPSY, HIATAL HERNIA REPAIR, AND INTRAOPERATIVE ENDOSCOPY WITH BIOPSY.    Nausea/Vomiting: Controlled.    Postoperative hydration reviewed and adequate.    Pain:  Pain Scale 1: Numeric (0 - 10) (09/01/18 1305)  Pain Intensity 1: 0 (09/01/18 1305)   Managed.    Neurological Status:   Neuro (WDL): Exceptions to WDL (09/01/18 1255)   At baseline.    Mental Status and Level of Consciousness: Arousable.    Pulmonary Status:   O2 Device: Nasal cannula (09/01/18 1305)   Adequate oxygenation and airway patent.    Complications related to anesthesia: None    Post-anesthesia assessment completed. No concerns.    Patient has met all discharge requirements.    Signed By: Maryan Char, MD    September 01, 2018                   Vitals Value Taken Time   BP 125/82 09/01/2018  1:05 PM   Temp 36.7 ??C (98.1 ??F) 09/01/2018 12:14 PM   Pulse 67 09/01/2018  1:12 PM   Resp 14 09/01/2018  1:12 PM   SpO2 100 % 09/01/2018  1:12 PM   Vitals shown include unvalidated device data.

## 2018-09-01 NOTE — Other (Signed)
Fawn reviewing sbar.

## 2018-09-01 NOTE — Interval H&P Note (Signed)
H&P Update:  Alexandria Vasquez was seen and examined.  History and physical has been reviewed. The patient has been examined. There have been no significant clinical changes since the completion of the originally dated History and Physical.

## 2018-09-01 NOTE — Other (Signed)
Negative pregnancy noted and confirmed with Kierra, CNA.

## 2018-09-01 NOTE — Other (Signed)
Dr. Sanders aware of BS 197, patient to received insulin per PACU orders.

## 2018-09-01 NOTE — Other (Signed)
Family updated with progress, awaiting room assignment.

## 2018-09-01 NOTE — Op Note (Signed)
OPERATIVE REPORT         Patient:Alexandria Vasquez   DOB: July 20, 1996  Medical Record [id number]    Pre-operative Diagnosis:  HYPERTENSION, DIABETES, MORBID OBESITY, BMI 41, FATTY LIVER  Post-operative Diagnosis: HYPERTENSION, DIABETES, MORBID OBESITY, BMI 41, FATTY LIVER  Procedure: Procedure(s):  1.LAPAROSCOPIC GASTRIC SLEEVE  2. WEDGE LIVER BIOPSY  3.DIAPHRAGMATIC HERNIA REPAIR  4. INTRAOPERATIVE ENDOSCOPY WITH BIOPSY  Location: [facility]  Surgeon: Crissie Reese, MD  Assistant:  Nadara Mustard [facility]  - (there are no qualified interns, residents, or any other qualified house   physicians available to assist with this surgery) performed retraction of various structures,  assisted in   creation of the gastric sleeve, fired stapling devices, obtained hemostasis along staple lines via hemoclips,       Anesthesia: General       Specimens: 1. Gastric Sleeve Resection                       2. Liver Wedge Biopsy    EBL: less than 5cc  Additional Findings: None  Complications: None             STATEMENT OF MEDICAL NECESSITY: The patient is a 23 y.o.-year-old female who has   had a history of obesity. she has failed conservative weight loss measures,   such began to consider weight loss surgical options. she chose the   sleeve gastrectomy as a means of surgical weight control. she has undergone   nutritional and psychological teaching at this time period and does wish to proceed   with sleeve gastrectomy.     OPERATIVE PROCEDURE: The patient was brought to the operating room, placed   on the table in supine position at which time general  anesthesia was administered   without any difficulty. The abdomen was then prepped and draped in the   usual sterile fashion. Using a 15 blade, a 1 cm incision was made just to the   left of the umbilicus. The veress needle approach was used to gain access to   the peritoneal cavity which was then insufflated. The Visi-Port was then placed   at that site,then 4 additional  trocars were placed in the usual U-shaped   configuration with a subxiphoid incision being made to accommodate the   [facility] retractor.  On entering the abdomen, the patient was noted to have a   moderately fatty liver with possible evidence of early steatohepatitis. I elevated the liver and noted the   patient had a diaphragmatic hernia present. I began the operation by choosing an area 2-3 cm   proximal to the pylorus and within the gastroepiploic vessels I began to divide off these vessels individually.   I moved cephalad toward short gastric vessels, which were very difficult to take down due to the proximity   to the splenic hilum. I was able to do so, clearing the entire left crural area. I then placed a Visigi tube,   impacting at the distal antrum. I then began the resection with the powered Echelon   stapler using the green loads for the first firing tangential along the   antral region. The second and subsequent firings were used with blue  reloads  reaching just past the incisura region. The remainder of the 4 vertical   firings completed the resection at the left crural region. I then tested   the pouch via the Visigi tube using dilute methylene blue,it was noted to be completely  Watertight.  I then left the operative field and proceeded to the the head of the bed and performed an   intraoperative EGD.  The scope was passed successfully into the gastric sleeve to the level of the pylorus.   Biopsies were taken of this region and submitted to test both for H Pylori and for pathologic diagnosis.    There was no bleeding noted and no leak appreciated with air insufflation.  I then returned to the surgical field.    I then obtained hemostasis along the staple line using   Hemoclips and sutures where needed.  I then used 3 separate 2-0 Ethibond sutures to secure the lateral   aspect of the newly created sleeve stomach to the resected edge of the gastrocolic omentum in an effort   to maintain the  continuity of the sleeve and prevent twisting.  I then turned my attention to the diaphragmatic   repair. I then dissected out the diaphragmatic hernia and closed it using a single separate 2-0 Ethibond   sutures against the esophageal wall, taking care not to encroach upon the esophagus. I then used Eviseal   along the entire staple line to obtain hemostasis and then place Surgicel Snow over the staple line also.   With all this having been completed, I then removed the liver retractor. I performed a liver wedge biopsy of   the left lobe of the liver due to its abnormality and submitted to pathology for permanent section. I then placed   a JP drain in the left upper quadrant region along the staple line. I removed the specimen from the operative   field via the LLQ incision. I closed the left lower quadrant trocar site using a transabdominal #0 PDS   suture along the fascia, and all skin incisions were then closed using 4-0 subcuticular Monocryl. Steri-Strips   and sterile dressings were applied. The patient tolerated the procedure well.       Angelique Holm M.D.

## 2018-09-01 NOTE — Interval H&P Note (Signed)
Room 327, family updated.

## 2018-09-01 NOTE — Progress Notes (Signed)
 TRANSFER - IN REPORT:    Verbal report received from [facility] (name) on Alexandria Vasquez  being received from PACU (unit) for routine post - op      Report consisted of patient's Situation, Background, Assessment and   Recommendations(SBAR).     Information from the following report(s) SBAR, Kardex and MAR was reviewed with the receiving nurse.    Opportunity for questions and clarification was provided.      Assessment completed upon patient's arrival to unit and care assumed.     Primary Nurse Brendalyn Shuck, RN and Larraine, RN performed a dual skin assessment on this patient No impairment noted  Braden score is 21    2025- Verbal/bedside report provided to A Banks RN  Report included SBAR, Kardex and MAR.

## 2018-09-01 NOTE — Interval H&P Note (Signed)
Patient assisted to the restroom and back to stretcher without incident.  Call bell within reach and no further needs at this time.

## 2018-09-01 NOTE — Interval H&P Note (Signed)
Spoke to [place] in [place] who spoke to Dr. Shanon Brow abour FSBS 202, A1C 8.4 and WBC was 16.4 on 08/27/18.    Orders given for 5 units Regular Insulin IV now and recheck FSBS in 1 hour after insulin given.

## 2018-09-01 NOTE — Interval H&P Note (Signed)
TRANSFER - IN REPORT:    Verbal report received from [facility] CRNA, OR nurse  on [employer]  being received from OR (unit) for routine progression of care      Report consisted of patient's Situation, Background, Assessment and   Recommendations(SBAR).     Information from the following report(s) OR Summary, Procedure Summary, Intake/Output and MAR was reviewed with the receiving nurse.    Opportunity for questions and clarification was provided.      Assessment completed upon patient's arrival to unit and care assumed.

## 2018-09-01 NOTE — Interval H&P Note (Signed)
Fawn reviewing sbar.

## 2018-09-01 NOTE — Interval H&P Note (Signed)
Negative pregnancy noted and confirmed with Montel Clock, CNA.

## 2018-09-01 NOTE — Care Coordination-Inpatient (Signed)
 Patient dozing in and out of sleep throughout visit.  Patient complained of expected pain with mild nausea.      Vitals:   Visit Vitals  BP 129/84   Pulse 88   Temp 97.5 F (36.4 C)   Resp 12   Ht 5' 7 (1.702 m)   Wt 111.3 kg (245 lb 7 oz)   SpO2 100%   BMI 38.44 kg/m     General: Alert & oriented to person, place, time, and situation  Pulmonary: Lungs clear to auscultation in all fields.  Cardiac: Regular rate and rhythm  Abdomen: Appropriate tenderness; soft; non-distended; hypo-active bowel sounds  Lap sites: Within normal limits  JP drain: No drainage noted   SCD's: In place and operating WNL    Plan:  -Continue medications for symptom management  -Encourage ambulation  -SCD use when in bed  -IS 10 times an hour  -NPO  -UGI in AM; bariatric full liquid diet  if UGI within normal limits    Plan was discussed with patient, as well as IS teaching provided.  She verbalized understanding to plan and education.

## 2018-09-01 NOTE — Interval H&P Note (Signed)
Dr. Allyne Gee aware of BS 197, patient to received insulin per PACU orders.

## 2018-09-01 NOTE — Anesthesia Pre-Procedure Evaluation (Signed)
Relevant Problems   No relevant active problems       Anesthetic History   No history of anesthetic complications            Review of Systems / Medical History  Patient summary reviewed, nursing notes reviewed and pertinent labs reviewed    Pulmonary  Within defined limits                 Neuro/Psych   Within defined limits           Cardiovascular    Hypertension              Exercise tolerance: >4 METS     GI/Hepatic/Renal  Within defined limits              Endo/Other    Diabetes    Morbid obesity     Other Findings              Physical Exam    Airway  Mallampati: II  TM Distance: 4 - 6 cm  Neck ROM: normal range of motion   Mouth opening: Normal     Cardiovascular    Rhythm: regular  Rate: normal         Dental  No notable dental hx       Pulmonary  Breath sounds clear to auscultation               Abdominal  GI exam deferred       Other Findings            Anesthetic Plan    ASA: 3  Anesthesia type: general          Induction: Intravenous  Anesthetic plan and risks discussed with: Patient

## 2018-09-01 NOTE — Addendum Note (Signed)
Addendum         created 09/01/18 1314 by Myrene Buddy, MD                   Attestation recorded in Intraprocedure, Intraprocedure Attestations filed

## 2018-09-01 NOTE — Interval H&P Note (Signed)
Family updated with progress, awaiting room assignment.

## 2018-09-01 NOTE — Interval H&P Note (Signed)
Primary Nurse Shon Millet, RN and Danne Harbor, RN performed a dual skin assessment on this patient Impairment noted- see wound doc flow sheet.    Reviewed PTA medication list with patient/caregiver and patient/caregiver denies any additional medications. Patient admits to having a responsible adult care for them for at least 24 hours after surgery.

## 2018-09-01 NOTE — Progress Notes (Signed)
VERIFIED WITH PATIENT THAT SHE DOES NOT USE A CPAP AT HOME.

## 2018-09-01 NOTE — Interval H&P Note (Signed)
 TRANSFER - OUT REPORT:    Verbal report given to [employer] (name) on Alexandria Vasquez  being transferred to 3 S (unit) for routine progression of care       Report consisted of patient's Situation, Background, Assessment and   Recommendations(SBAR).     Information from the following report(s) OR Summary, Procedure Summary, Intake/Output and MAR was reviewed with the receiving nurse.    Lines:   Peripheral IV 09/01/18 Left Hand (Active)   Site Assessment Clean, dry, & intact 09/01/2018 12:50 PM   Phlebitis Assessment 0 09/01/2018 12:50 PM   Infiltration Assessment 0 09/01/2018 12:50 PM   Dressing Status Clean, dry, & intact 09/01/2018 12:50 PM   Dressing Type Transparent;Tape 09/01/2018 12:50 PM   Hub Color/Line Status Infusing 09/01/2018 12:50 PM        Opportunity for questions and clarification was provided.      Patient transported with:   O2 @ 2 liters  Registered Nurse

## 2018-09-02 ENCOUNTER — Inpatient Hospital Stay: Admit: 2018-09-02 | Payer: MEDICAID | Primary: Family Medicine

## 2018-09-02 ENCOUNTER — Encounter

## 2018-09-02 LAB — COMPREHENSIVE METABOLIC PANEL
ALT: 68 U/L — ABNORMAL HIGH (ref 13–56)
AST: 46 U/L — ABNORMAL HIGH (ref 10–38)
Albumin/Globulin Ratio: 1 (ref 0.8–1.7)
Albumin: 2.9 g/dL — ABNORMAL LOW (ref 3.4–5.0)
Alkaline Phosphatase: 53 U/L (ref 45–117)
Anion Gap: 5 mmol/L (ref 3.0–18)
BUN: 5 MG/DL — ABNORMAL LOW (ref 7.0–18)
Bun/Cre Ratio: 13 (ref 12–20)
CO2: 28 mmol/L (ref 21–32)
Calcium: 8.3 MG/DL — ABNORMAL LOW (ref 8.5–10.1)
Chloride: 106 mmol/L (ref 100–111)
Creatinine: 0.38 MG/DL — ABNORMAL LOW (ref 0.6–1.3)
EGFR IF NonAfrican American: >60 mL/min/{1.73_m2} (ref >60)
GFR African American: >60 mL/min/{1.73_m2} (ref >60)
Globulin: 2.9 g/dL (ref 2.0–4.0)
Glucose: 101 mg/dL — ABNORMAL HIGH (ref 74–99)
Potassium: 3.5 mmol/L (ref 3.5–5.5)
Sodium: 139 mmol/L (ref 136–145)
Total Bilirubin: 0.7 MG/DL (ref 0.2–1.0)
Total Protein: 5.8 g/dL — ABNORMAL LOW (ref 6.4–8.2)

## 2018-09-02 LAB — CBC WITH AUTO DIFFERENTIAL
Basophils %: 0 % (ref 0–2)
Basophils Absolute: 0 10*3/uL (ref 0.0–0.1)
Eosinophils %: 1 % (ref 0–5)
Eosinophils Absolute: 0.1 10*3/uL (ref 0.0–0.4)
Hematocrit: 36.6 % (ref 35.0–45.0)
Hemoglobin: 12.3 g/dL (ref 12.0–16.0)
Lymphocytes %: 25 % (ref 21–52)
Lymphocytes Absolute: 3.7 10*3/uL — ABNORMAL HIGH (ref 0.9–3.6)
MCH: 29.1 PG (ref 24.0–34.0)
MCHC: 33.6 g/dL (ref 31.0–37.0)
MCV: 86.5 FL (ref 74.0–97.0)
MPV: 9.7 FL (ref 9.2–11.8)
Monocytes %: 7 % (ref 3–10)
Monocytes Absolute: 1 10*3/uL (ref 0.05–1.2)
Neutrophils %: 67 % (ref 40–73)
Neutrophils Absolute: 10 10*3/uL — ABNORMAL HIGH (ref 1.8–8.0)
Platelets: 278 10*3/uL (ref 135–420)
RBC: 4.23 M/uL (ref 4.20–5.30)
RDW: 14.9 % — ABNORMAL HIGH (ref 11.6–14.5)
WBC: 14.8 10*3/uL — ABNORMAL HIGH (ref 4.6–13.2)

## 2018-09-02 LAB — POCT GLUCOSE
POC Glucose: 124 mg/dL — ABNORMAL HIGH (ref 70–110)
POC Glucose: 113 mg/dL — ABNORMAL HIGH (ref 70–110)
POC Glucose: 136 mg/dL — ABNORMAL HIGH (ref 70–110)

## 2018-09-02 LAB — CBC WITH AUTOMATED DIFF
ABS. BASOPHILS: 0 10*3/uL (ref 0.0–0.1)
ABS. EOSINOPHILS: 0.1 10*3/uL (ref 0.0–0.4)
ABS. LYMPHOCYTES: 3.7 10*3/uL — ABNORMAL HIGH (ref 0.9–3.6)
ABS. MONOCYTES: 1 10*3/uL (ref 0.05–1.2)
ABS. NEUTROPHILS: 10 10*3/uL — ABNORMAL HIGH (ref 1.8–8.0)
BASOPHILS: 0 % (ref 0–2)
EOSINOPHILS: 1 % (ref 0–5)
HCT: 36.6 % (ref 35.0–45.0)
HGB: 12.3 g/dL (ref 12.0–16.0)
LYMPHOCYTES: 25 % (ref 21–52)
MCH: 29.1 PG (ref 24.0–34.0)
MCHC: 33.6 g/dL (ref 31.0–37.0)
MCV: 86.5 FL (ref 74.0–97.0)
MONOCYTES: 7 % (ref 3–10)
MPV: 9.7 FL (ref 9.2–11.8)
NEUTROPHILS: 67 % (ref 40–73)
PLATELET: 278 10*3/uL (ref 135–420)
RBC: 4.23 M/uL (ref 4.20–5.30)
RDW: 14.9 % — ABNORMAL HIGH (ref 11.6–14.5)
WBC: 14.8 10*3/uL — ABNORMAL HIGH (ref 4.6–13.2)

## 2018-09-02 LAB — METABOLIC PANEL, COMPREHENSIVE
A-G Ratio: 1 (ref 0.8–1.7)
ALT (SGPT): 68 U/L — ABNORMAL HIGH (ref 13–56)
AST (SGOT): 46 U/L — ABNORMAL HIGH (ref 10–38)
Albumin: 2.9 g/dL — ABNORMAL LOW (ref 3.4–5.0)
Alk. phosphatase: 53 U/L (ref 45–117)
Anion gap: 5 mmol/L (ref 3.0–18)
BUN/Creatinine ratio: 13 (ref 12–20)
BUN: 5 MG/DL — ABNORMAL LOW (ref 7.0–18)
Bilirubin, total: 0.7 MG/DL (ref 0.2–1.0)
CO2: 28 mmol/L (ref 21–32)
Calcium: 8.3 MG/DL — ABNORMAL LOW (ref 8.5–10.1)
Chloride: 106 mmol/L (ref 100–111)
Creatinine: 0.38 MG/DL — ABNORMAL LOW (ref 0.6–1.3)
GFR est AA: >60 mL/min/{1.73_m2} (ref >60)
GFR est non-AA: >60 mL/min/{1.73_m2} (ref >60)
Globulin: 2.9 g/dL (ref 2.0–4.0)
Glucose: 101 mg/dL — ABNORMAL HIGH (ref 74–99)
Potassium: 3.5 mmol/L (ref 3.5–5.5)
Protein, total: 5.8 g/dL — ABNORMAL LOW (ref 6.4–8.2)
Sodium: 139 mmol/L (ref 136–145)

## 2018-09-02 LAB — GLUCOSE, POC
Glucose (POC): 124 mg/dL — ABNORMAL HIGH (ref 70–110)
Glucose (POC): 113 mg/dL — ABNORMAL HIGH (ref 70–110)
Glucose (POC): 136 mg/dL — ABNORMAL HIGH (ref 70–110)

## 2018-09-02 MED ORDER — OXYCODONE-ACETAMINOPHEN 5 MG-325 MG TAB
5-325 mg | ORAL_TABLET | ORAL | 0 refills | Status: AC | PRN
Start: 2018-09-02 — End: 2018-09-07

## 2018-09-02 MED ORDER — OXYCODONE-ACETAMINOPHEN 5 MG-325 MG TAB
5-325 mg | Freq: Four times a day (QID) | ORAL | Status: DC | PRN
Start: 2018-09-02 — End: 2018-09-02
  Administered 2018-09-02: 17:00:00 via ORAL

## 2018-09-02 MED ORDER — ENOXAPARIN 40 MG/0.4 ML SUB-Q SYRINGE
40 mg/0.4 mL | INJECTION | Freq: Two times a day (BID) | SUBCUTANEOUS | 0 refills | Status: AC
Start: 2018-09-02 — End: 2018-09-09

## 2018-09-02 MED ORDER — IOHEXOL 240 MG/ML IV SOLN
240 mg iodine/mL | Freq: Once | INTRAVENOUS | Status: AC
Start: 2018-09-02 — End: 2018-09-02
  Administered 2018-09-02: 13:00:00 via ORAL

## 2018-09-02 MED ORDER — OMEPRAZOLE 20 MG CAP, DELAYED RELEASE
20 mg | ORAL_CAPSULE | Freq: Every day | ORAL | 3 refills | Status: AC
Start: 2018-09-02 — End: ?

## 2018-09-02 MED FILL — OFIRMEV 1,000 MG/100 ML (10 MG/ML) INTRAVENOUS SOLUTION: 1000 mg/100 mL (10 mg/mL) | INTRAVENOUS | Qty: 100

## 2018-09-02 MED FILL — ENOXAPARIN 40 MG/0.4 ML SUB-Q SYRINGE: 40 mg/0.4 mL | SUBCUTANEOUS | Qty: 0.4

## 2018-09-02 MED FILL — METOCLOPRAMIDE 5 MG/ML IJ SOLN: 5 mg/mL | INTRAMUSCULAR | Qty: 2

## 2018-09-02 MED FILL — PROTONIX 40 MG INTRAVENOUS SOLUTION: 40 mg | INTRAVENOUS | Qty: 40

## 2018-09-02 MED FILL — KETOROLAC TROMETHAMINE 30 MG/ML INJECTION: 30 mg/mL (1 mL) | INTRAMUSCULAR | Qty: 1

## 2018-09-02 MED FILL — CEFAZOLIN 2 GM/50 ML IN DEXTROSE (ISO-OSMOTIC) IVPB: 2 gram/50 mL | INTRAVENOUS | Qty: 50

## 2018-09-02 MED FILL — OXYCODONE-ACETAMINOPHEN 5 MG-325 MG TAB: 5-325 mg | ORAL | Qty: 2

## 2018-09-02 MED FILL — OMNIPAQUE 240 MG IODINE/ML INTRAVENOUS SOLUTION: 240 mg iodine/mL | INTRAVENOUS | Qty: 150

## 2018-09-02 NOTE — Progress Notes (Signed)
Received bedside shift report from A Banks, RN. Patient A/OX4. Patient in bed resting quietly. No complaints of pain or nausea at this time.    0730-Patient taken down to X-Ray.   0830-Patient back on unit.    Diet changed to Full liquid. Instructed on how to order.     1100-Patient ambulating unit.     1147-Patient requesting something for pain following ambulation. Administered Percocet 5-325mg 2 tabs Po.    1332-Patient tolerating diet well.     1409-Removed JP drain, Patient tolerated well.     1426-Patient ambulating unit.     SHIFT SUMMARY:  Pain medication requested 1X during shift.   No nausea throughout shift.   Patient ambulated 2x during shift.     Patient Vitals for the past 12 hrs:   Temp Pulse Resp BP SpO2   09/02/18 1135 98.3 ??F (36.8 ??C) 72 18 133/79 97 %   09/02/18 0729 98.6 ??F (37 ??C) 79 18 121/69 97 %   09/02/18 0348 98.7 ??F (37.1 ??C) 74 18 150/90 99 %     Discharge nurse reviewed discharge instructions with the patient.  The patient verbalized understanding.    Patient discharged.

## 2018-09-02 NOTE — Other (Signed)
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

## 2018-09-02 NOTE — Progress Notes (Signed)
Bariatric Surgery                POD #1    Visit Vitals  BP 133/79 (BP 1 Location: Right arm, BP Patient Position: At rest)   Pulse 72   Temp 98.3 ??F (36.8 ??C)   Resp 18   Ht 5' 7" (1.702 m)   Wt 118.6 kg (261 lb 8 oz)   SpO2 97%   Breastfeeding No   BMI 40.96 kg/m??     Patient has minimal complaints of pain, minimal nausea noted     Exam:  Appears well in no distress  Lungs- clear bilaterally  Abd - soft, incisions look good without erythema           JP with minimal serosanguinous output  Extremities- no new edema or swelling    UGI - no obstrustion or leak    Data Review:    Labs: Results:       Chemistry Recent Labs     09/02/18  0455   GLU 101*   NA 139   K 3.5   CL 106   CO2 28   BUN 5*   CREA 0.38*   CA 8.3*   AGAP 5   BUCR 13   AP 53   TP 5.8*   ALB 2.9*   GLOB 2.9   AGRAT 1.0      CBC w/Diff Recent Labs     09/02/18  0455   WBC 14.8*   RBC 4.23   HGB 12.3   HCT 36.6   PLT 278   GRANS 67   LYMPH 25   EOS 1      Coagulation No results for input(s): PTP, INR, APTT, INREXT in the last 72 hours.    Liver Enzymes Recent Labs     09/02/18  0455   TP 5.8*   ALB 2.9*   AP 53   SGOT 46*          Assessment/Plan: S/P  laparoscopic sleeve gastrectomy - doing well without any issues    1.Start bariatric diet and protein shakes  2.D/C IV pain meds and start PO pain meds  3.D/C JP drain  4.Likley PM D/C if  Cont ok and tolerate PO

## 2018-09-02 NOTE — Nurse Consult (Signed)
Patient's family at bedside.  Patient just getting back into bed after urinating.  Vitals:   Blood pressure 121/69, pulse 79, temperature 98.6 ??F (37 ??C), resp. rate 18, height 5' 7" (1.702 m), weight 118.6 kg (261 lb 8 oz), SpO2 97 %, not currently breastfeeding.  Output: reviewed, and patient verified urinating clear, yellow urine.  Pulmonary: Clear in all lobes  Cardiac: Regular rate and rhythm  Abdomen: Bowel sounds hypo-active x4.  Lap sites without erythema, swelling,  and/or drainage.  JP drain with a small amount of serosanguinous drainage.    SCD's: Positioned and operating WNL    Patient with expected pain, but is being managed and is currently tolerable.  No nausea and/or vomiting.  Patient has been ambulating the halls.  Patient has not had the opportunity to swallow pills.    Post-op diet progression discussed with patient.  Patient to be discharged on a bariatric full liquid diet.  Patient verbalized understanding of liquid diet for next two weeks until first post-op visit.  Goal of four ounces per hour with one ounce being protein was clearly understood. Medications were discussed (i.e., pain- Percocet, Tylenol, not to use aspirin or ibuprofen based products, as well as steroids).  Constipation was discussed and ways to alleviate it were discussed.  Education completed on IS use and to ambulate every hour when at home. Patient completed a return demonstration on IS with no issues or concerns from this RN.  All scripts given during pre-op visit were filled and in the home.  Lovenox education provided, and patient will be administering herself.  Ketosis was also reviewed.  Patient given a report card to record intake and a handout to support bedside education.  All questions were answered by this RN, and patient verbalized understanding to all education provided.  Goals for discharge were discussed, and patient verbalized understanding.  Post-op follow-up appt. aready scheduled.

## 2018-09-02 NOTE — Progress Notes (Signed)
AMD   Chaplain addressed Advance Medical Directives.   Chaplain completed visit with patient and offered Pastoral care.   Chaplains will continue to follow and will provide pastoral care as needed or requested    Sister Keli Buehner, MA, [facility]  Chaplain   Spiritual Care  [phone]

## 2018-09-02 NOTE — Discharge Summary (Signed)
Discharge Summary    Patient: Alexandria Vasquez               Sex: female          DOA: September 29, 2018         Date of Birth:  [birth year]      Age:  23 y.o.        LOS:  LOS: 1 day                Admit Date: 29-Sep-2018    Discharge Date: 09/02/2018    Admission Diagnoses: Morbid obesity with BMI of 40.0-44.9, adult (HCC) [E66.01, Z68.41]    Discharge Diagnoses:    Problem List as of 09/02/2018 Date Reviewed: 2018/09/29          Codes Class Noted - Resolved    Severe obesity (BMI 35.0-39.9) with comorbidity (HCC) ICD-10-CM: E66.01  ICD-9-CM: 278.01  Unknown - Present        Uses birth control ICD-10-CM: Z78.9  ICD-9-CM: V49.89  Unknown - Present    Overview Signed 07/15/2018  9:27 AM by Mignon Pine, PA     implant             Smoking history ICD-10-CM: Z87.891  ICD-9-CM: V15.82  Unknown - Present    Overview Signed 07/15/2018  9:29 AM by Mignon Pine, PA     quit 2018             Obesity, morbid (HCC) ICD-10-CM: E66.01  ICD-9-CM: 278.01  01/05/2018 - Present        Hypertension ICD-10-CM: I10  ICD-9-CM: 401.9  Unknown - Present        Diabetes (HCC) ICD-10-CM: E11.9  ICD-9-CM: 250.00  Unknown - Present        * (Principal) Morbid obesity with BMI of 40.0-44.9, adult [facility]) ICD-10-CM: E66.01, Z68.41  ICD-9-CM: 278.01, V85.41  01/05/2018 - Present        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 The patient tolerated the procedure well. Vital signs remained stable and the patient was transferred to  3rd floor surgical unit without complications. The patient remained stable throughout the first night post operatively with stable vital signs and adequate urine output and pain control. Pain was controlled with Dilaudid IV and IV Tylenol .  The patient on the first morning post operative was transferred to the radiology suite where they underwent a gastrograffin UGI study  which showed no evidence of a leak or stricture. The drain was discontinued on POD # 1 and the patient was started on a bariatric liquid diet with protein shakes. The patient progressed throughout the day and was ambulating well and tolerating their diet. They were therefore discharged home with instructions to notify me with any issues that may arise.    Significant Diagnostic Studies:   Recent Labs     09/02/18  0455   HGB 12.3     Recent Labs     09/02/18  0455   HCT 36.6       Current Discharge Medication List      CONTINUE these medications which have NOT CHANGED    Details   loratadine (CLARITIN) 10 mg tablet Take 10 mg by mouth daily.      losartan (COZAAR) 25 mg tablet Take  25 mg by mouth daily.      etonogestrel (NEXPLANON) 68 mg impl by SubDERmal route.         STOP taking these medications       glimepiride (AMARYL) 2 mg tablet Comments:   Reason for Stopping:         metFORMIN (GLUCOPHAGE) 500 mg tablet Comments:   Reason for Stopping:               Activity: activity as tolerated with no heavy lifting of greater than 20 pounds.     No anti- inflammatory medications. Use stool softeners at home as needed while taking pain medications since they are constipating.    Diet: Bariatric liquid diet    Wound Care: Keep wound clean and dry, Reinforce dressing PRN and ice to area for comfort. Do not get wound wet for 2 days.    Follow-up: 14 days with Dr Terracina      Anthony Terracina M.D

## 2018-09-02 NOTE — Other (Signed)
Bedside and Verbal shift change report given to B. Donnalee Curry RN  (Cabin crew) by  A. Banks RN  Physiological scientist). Report included the following information SBAR, Kardex, OR Summary, Intake/Output, MAR and Recent Results.

## 2018-09-02 NOTE — Progress Notes (Signed)
Problem: Diabetes Self-Management  Goal: *Disease process and treatment process  Description  Define diabetes and identify own type of diabetes; list 3 options for treating diabetes.  Outcome: Progressing Towards Goal  Goal: *Incorporating nutritional management into lifestyle  Description  Describe effect of type, amount and timing of food on blood glucose; list 3 methods for planning meals.  Outcome: Progressing Towards Goal  Goal: *Incorporating physical activity into lifestyle  Description  State effect of exercise on blood glucose levels.  Outcome: Progressing Towards Goal  Goal: *Developing strategies to promote health/change behavior  Description  Define the ABC's of diabetes; identify appropriate screenings, schedule and personal plan for screenings.  Outcome: Progressing Towards Goal  Goal: *Using medications safely  Description  State effect of diabetes medications on diabetes; name diabetes medication taking, action and side effects.  Outcome: Progressing Towards Goal  Goal: *Monitoring blood glucose, interpreting and using results  Description  Identify recommended blood glucose targets  and personal targets.  Outcome: Progressing Towards Goal  Goal: *Prevention, detection, treatment of acute complications  Description  List symptoms of hyper- and hypoglycemia; describe how to treat low blood sugar and actions for lowering  high blood glucose level.  Outcome: Progressing Towards Goal  Goal: *Prevention, detection and treatment of chronic complications  Description  Define the natural course of diabetes and describe the relationship of blood glucose levels to long term complications of diabetes.  Outcome: Progressing Towards Goal  Goal: *Developing strategies to address psychosocial issues  Description  Describe feelings about living with diabetes; identify support needed and support network  Outcome: Progressing Towards Goal  Goal: *Insulin pump training  Outcome: Progressing Towards Goal   Goal: *Sick day guidelines  Outcome: Progressing Towards Goal  Goal: *Patient Specific Goal (EDIT GOAL, INSERT TEXT)  Outcome: Progressing Towards Goal

## 2018-09-02 NOTE — Progress Notes (Signed)
Bariatric Surgery                POD #1    Visit Vitals  BP 150/90 (BP 1 Location: Right arm, BP Patient Position: At rest)   Pulse 74   Temp 98.7 ??F (37.1 ??C)   Resp 18   Ht 5' 7" (1.702 m)   Wt 118.6 kg (261 lb 8 oz)   SpO2 99%   Breastfeeding No   BMI 40.96 kg/m??     Patient has minimal complaints of pain, minimal nausea noted     Exam:  Appears well in no distress  Lungs- clear bilaterally  Abd - soft, incisions look good without erythema           JP with minimal serosanguinous output  Extremities- no new edema or swelling    UGI - pending    Data Review:    Labs: Results:       Chemistry Recent Labs     09/02/18  0455   GLU 101*   NA 139   K 3.5   CL 106   CO2 28   BUN 5*   CREA 0.38*   CA 8.3*   AGAP 5   BUCR 13   AP 53   TP 5.8*   ALB 2.9*   GLOB 2.9   AGRAT 1.0      CBC w/Diff Recent Labs     09/02/18  0455   WBC 14.8*   RBC 4.23   HGB 12.3   HCT 36.6   PLT 278   GRANS 67   LYMPH 25   EOS 1      Coagulation No results for input(s): PTP, INR, APTT, INREXT in the last 72 hours.    Liver Enzymes Recent Labs     09/02/18  0455   TP 5.8*   ALB 2.9*   AP 53   SGOT 46*          Assessment/Plan: S/P  laparoscopic sleeve gastrectomy - doing well without any issues.  Will go home off all DM meds.    Following orders after UGI confirmed normal -     1.Start bariatric diet and protein shakes  2.D/C IV pain meds and start PO pain meds  3.D/C JP drain  4.Likley PM D/C if  Cont ok and tolerate PO

## 2018-09-02 NOTE — Other (Signed)
Diabetes Patient/Family Education Record  Factors That  May Influence Patients Ability  to Learn or  Comply with Recommendations   []    Language barrier    []    Cultural needs   []    Motivation    []    Cognitive limitation    []    Physical   []    Education    []    Physiological factors   []    Hearing/vision/speaking impairment   []    Religious beliefs    []    Financial factors   []   Other:   [x]   No factors identified at this time.     Person Instructed:   [x]    Patient   []    Family   []   Other     Preference for Learning:   [x]    Verbal   []    Written   []   Demonstration     Level of Comprehension & Competence:    [x]   Good                                      []  Fair                                     []   Poor                             []   Needs Reinforcement   [x]   Teachback completed    Education Component: DM in remission s/p bariatric surgery   [x]   Medication management, including confirmation of home regimen; pt will not resume DM meds at home    [x]   Nutritional management -obtain usual meal pattern; followed by OP Bariatric RD   []   Exercise   []   Signs, symptoms, and treatment of hyperglycemia and hypoglycemia   []  Prevention, recognition and treatment of hyperglycemia and hypoglycemia   [x]   Importance of blood glucose monitoring; pt instructed to continue to monitor BG as diet progresses   []   Instruction on use of the blood glucose meter   [x]   Discuss the importance of HbA1C monitoring ; encouraged to continue to monitor    []   Sick day guidelines   []   Proper use and disposal of lancets, needles, syringes or insulin pens (if appropriate)   []   Potential long-term complications (retinopathy, kidney disease, neuropathy, foot care)   []  Information about whom to contact in case of emergency or for more information    []   Goal:  Patient/family will demonstrate understanding of Diabetes Self Management Skills by: (date) _______   Plan for post-discharge education or self-management support:    []  Outpatient class schedule provided            []  Patient Declined    []  Scheduled for outpatient classes (date) _______  Verify:  Does patient understand how diabetes medications work? ____________________________  Does patient know what their most recent A1c is? ___________________________________  Does patient monitor glucose at home? ___________________________________________  Does patient have difficulty obtaining diabetes medications or testing supplies? _________________           Missy Sabins MS, RN, CDE  Glycemic Control Team  [phone]  Pager [phone] (M-TH 8:00-4:30P)  *After Hours pager [phone]

## 2018-09-02 NOTE — Progress Notes (Signed)
Transition of Care (TOC) Plan:    Physician follow up     Chart reviewed.  Pt admitted for an elective surgical procedure (LAPAROSCOPIC GASTRIC SLEEVE, WEDGE LIVER BIOPSY, HIATAL HERNIA REPAIR, AND INTRAOPERATIVE ENDOSCOPY WITH BIOPSY).  Pt is independent.  Please encourage ambulation.  No transition of care needs identified at this time.  Anticipate pt will be medically stable for discharge within the next 24-48 hours with physician follow up.  CM available to assist as needed.        TOC Transportation:   How is patient being transported at discharge? Family/Friend      When? Once cleared by physician     Is transport scheduled? N/A      Follow-up appointment and transportation:   PCP/Specialist?  See AVS for Appointment         Who is transporting to the follow-up appointment? Self/Family/Friend      Is transport for follow up appointment scheduled? N/A    Communication plan (with patient/family):    Who is being called?  Patient or Next of Kin?  Responsible party?     Patient      What number(s) is to be used?  See Facesheet      What service provider is calling for TOC services?              When are they calling?      Readmission Risk?  (Green/Low; Yellow/Moderate; Red/High):  Green    Care Management Interventions  Mode of Transport at Discharge: Other (see comment)(Family)  Transition of Care Consult (CM Consult): Discharge Planning  Health Maintenance Reviewed: Yes  Current Support Network: Relative's Home  Confirm Follow Up Transport: Family  The Plan for Transition of Care is Related to the Following Treatment Goals : home with physician follow up  Discharge Location  Discharge Placement: Home with family assistance

## 2018-09-02 NOTE — Progress Notes (Signed)
Shift Summary:  No distress or complaints. Up to bathroom to void. Not passing gas.Surgical sites clean dry and intact. JP drain without drainage during the shift.Ambulated in hall x 1. Using IS, SCDs, and TEDs.. Tolerating ice chips.

## 2018-09-02 NOTE — Telephone Encounter (Signed)
Pt called and stated that her medication was not sent to her pharm after her discharge today.  Spoke with Tresa Endo and she stated that she will send it to pharm now. Pt is aware.

## 2018-09-02 NOTE — Progress Notes (Signed)
Discharge instructions reviewed with the patient. Patient verbalized understanding and verified by teach back. All questions answered. IV discontinued, no redness, swelling or pain noted. Patient awaiting 3pm to go home and family is in room for transportation home, with an ETA of 15min. Patient discharged off the unit via wheelchair. Patient armband removed and shredded

## 2018-09-02 NOTE — Care Coordination-Inpatient (Signed)
Patient's family at bedside.  Patient just getting back into bed after urinating.  Vitals:   Blood pressure 121/69, pulse 79, temperature 98.6 F (37 C), resp. rate 18, height 5\' 7"  (1.702 m), weight 118.6 kg (261 lb 8 oz), SpO2 97 %, not currently breastfeeding.  Output: reviewed, and patient verified urinating clear, yellow urine.  Pulmonary: Clear in all lobes  Cardiac: Regular rate and rhythm  Abdomen: Bowel sounds hypo-active x4.  Lap sites without erythema, swelling,  and/or drainage.  JP drain with a small amount of serosanguinous drainage.    SCD's: Positioned and operating WNL    Patient with expected pain, but is being managed and is currently tolerable.  No nausea and/or vomiting.  Patient has been ambulating the halls.  Patient has not had the opportunity to swallow pills.    Post-op diet progression discussed with patient.  Patient to be discharged on a bariatric full liquid diet.  Patient verbalized understanding of liquid diet for next two weeks until first post-op visit.  Goal of four ounces per hour with one ounce being protein was clearly understood. Medications were discussed (i.e., pain- Percocet, Tylenol, not to use aspirin or ibuprofen based products, as well as steroids).  Constipation was discussed and ways to alleviate it were discussed.  Education completed on IS use and to ambulate every hour when at home. Patient completed a return demonstration on IS with no issues or concerns from this RN.  All scripts given during pre-op visit were filled and in the home.  Lovenox education provided, and patient will be administering herself.  Ketosis was also reviewed.  Patient given a report card to record intake and a handout to support bedside education.  All questions were answered by this RN, and patient verbalized understanding to all education provided.  Goals for discharge were discussed, and patient verbalized understanding.  Post-op follow-up appt. aready scheduled.

## 2018-09-02 NOTE — Progress Notes (Signed)
Transition of Care (TOC) Plan:    Physician follow up     Chart reviewed.  Pt admitted for an elective surgical procedure (LAPAROSCOPIC GASTRIC SLEEVE, WEDGE LIVER BIOPSY, HIATAL HERNIA REPAIR, AND INTRAOPERATIVE ENDOSCOPY WITH BIOPSY).  Pt is independent.  Please encourage ambulation.  No transition of care needs identified at this time.  Anticipate pt will be medically stable for discharge within the next 24-48 hours with physician follow up.  CM available to assist as needed.        TOC Transportation:   How is patient being transported at discharge? Family/Friend      When? Once cleared by physician     Is transport scheduled? N/A      Follow-up appointment and transportation:   PCP/Specialist?  See AVS for Appointment         Who is transporting to the follow-up appointment? Self/Family/Friend      Is transport for follow up appointment scheduled? N/A    Communication plan (with patient/family):    Who is being called?  Patient or Next of Kin?  Responsible party?     Patient      What number(s) is to be used?  See Facesheet      What service provider is calling for [facility] services?              When are they calling?      Readmission Risk?  (Green/Low; Yellow/Moderate; Red/High):  Green    Care Management Interventions  Mode of Transport at Discharge: Other (see comment)(Family)  Transition of Care Consult (CM Consult): Discharge Planning  Health Maintenance Reviewed: Yes  Current Support Network: Relative's Home  Confirm Follow Up Transport: Family  The Plan for Transition of Care is Related to the Following Treatment Goals : home with physician follow up  Discharge Location  Discharge Placement: Home with family assistance

## 2018-09-02 NOTE — Progress Notes (Signed)
Bariatric Surgery                POD #1    Visit Vitals  BP 133/79 (BP 1 Location: Right arm, BP Patient Position: At rest)   Pulse 72   Temp 98.3 ??F (36.8 ??C)   Resp 18   Ht 5\' 7"  (1.702 m)   Wt 118.6 kg (261 lb 8 oz)   SpO2 97%   Breastfeeding No   BMI 40.96 kg/m??     Patient has minimal complaints of pain, minimal nausea noted     Exam:  Appears well in no distress  Lungs- clear bilaterally  Abd - soft, incisions look good without erythema           JP with minimal serosanguinous output  Extremities- no new edema or swelling    UGI - no obstrustion or leak    Data Review:    Labs: Results:       Chemistry Recent Labs     09/02/18  0455   GLU 101*   NA 139   K 3.5   CL 106   CO2 28   BUN 5*   CREA 0.38*   CA 8.3*   AGAP 5   BUCR 13   AP 53   TP 5.8*   ALB 2.9*   GLOB 2.9   AGRAT 1.0      CBC w/Diff Recent Labs     09/02/18  0455   WBC 14.8*   RBC 4.23   HGB 12.3   HCT 36.6   PLT 278   GRANS 67   LYMPH 25   EOS 1      Coagulation No results for input(s): PTP, INR, APTT, INREXT in the last 72 hours.    Liver Enzymes Recent Labs     09/02/18  0455   TP 5.8*   ALB 2.9*   AP 53   SGOT 46*          Assessment/Plan: S/P  laparoscopic sleeve gastrectomy - doing well without any issues    1.Start bariatric diet and protein shakes  2.D/C IV pain meds and start PO pain meds  3.D/C JP drain  4.Likley PM D/C if  Cont ok and tolerate PO

## 2018-09-02 NOTE — Progress Notes (Signed)
Discharge instructions reviewed with the patient. Patient verbalized understanding and verified by teach back. All questions answered. IV discontinued, no redness, swelling or pain noted. Patient awaiting 3pm to go home and family is in room for transportation home, with an ETA of . Patient discharged off the unit via wheelchair. Patient armband removed and shredded

## 2018-09-02 NOTE — Progress Notes (Signed)
Shift Summary:  No distress or complaints. Up to bathroom to void. Not passing gas.Surgical sites clean dry and intact. JP drain without drainage during the shift.Ambulated in hall x 1. Using IS, SCDs, and TEDs.. Tolerating ice chips.

## 2018-09-02 NOTE — Progress Notes (Signed)
Received bedside shift report from Genene Churn, RN. Patient A/OX4. Patient in bed resting quietly. No complaints of pain or nausea at this time.    0730-Patient taken down to X-Ray.   0830-Patient back on unit.    Diet changed to Full liquid. Instructed on how to order.     1100-Patient ambulating unit.     1147-Patient requesting something for pain following ambulation. Administered Percocet 5-325mg  2 tabs Po.    1332-Patient tolerating diet well.     1409-Removed JP drain, Patient tolerated well.     1426-Patient ambulating unit.     SHIFT SUMMARY:  Pain medication requested 1X during shift.   No nausea throughout shift.   Patient ambulated 2x during shift.     Patient Vitals for the past 12 hrs:   Temp Pulse Resp BP SpO2   09/02/18 1135 98.3 F (36.8 C) 72 18 133/79 97 %   09/02/18 0729 98.6 F (37 C) 79 18 121/69 97 %   09/02/18 0348 98.7 F (37.1 C) 74 18 150/90 99 %     Discharge nurse reviewed discharge instructions with the patient.  The patient verbalized understanding.    Patient discharged.

## 2018-09-02 NOTE — Progress Notes (Signed)
Problem: Diabetes Self-Management  Goal: *Disease process and treatment process  Description  Define diabetes and identify own type of diabetes; list 3 options for treating diabetes.  Outcome: Progressing Towards Goal  Goal: *Incorporating nutritional management into lifestyle  Description  Describe effect of type, amount and timing of food on blood glucose; list 3 methods for planning meals.  Outcome: Progressing Towards Goal  Goal: *Incorporating physical activity into lifestyle  Description  State effect of exercise on blood glucose levels.  Outcome: Progressing Towards Goal  Goal: *Developing strategies to promote health/change behavior  Description  Define the ABC's of diabetes; identify appropriate screenings, schedule and personal plan for screenings.  Outcome: Progressing Towards Goal  Goal: *Using medications safely  Description  State effect of diabetes medications on diabetes; name diabetes medication taking, action and side effects.  Outcome: Progressing Towards Goal  Goal: *Monitoring blood glucose, interpreting and using results  Description  Identify recommended blood glucose targets  and personal targets.  Outcome: Progressing Towards Goal  Goal: *Prevention, detection, treatment of acute complications  Description  List symptoms of hyper- and hypoglycemia; describe how to treat low blood sugar and actions for lowering  high blood glucose level.  Outcome: Progressing Towards Goal  Goal: *Prevention, detection and treatment of chronic complications  Description  Define the natural course of diabetes and describe the relationship of blood glucose levels to long term complications of diabetes.  Outcome: Progressing Towards Goal  Goal: *Developing strategies to address psychosocial issues  Description  Describe feelings about living with diabetes; identify support needed and support network  Outcome: Progressing Towards Goal  Goal: *Insulin pump training  Outcome: Progressing Towards Goal  Goal: *Sick  day guidelines  Outcome: Progressing Towards Goal  Goal: *Patient Specific Goal (EDIT GOAL, INSERT TEXT)  Outcome: Progressing Towards Goal

## 2018-09-02 NOTE — Progress Notes (Signed)
Bariatric Surgery                POD #1    Visit Vitals  BP 150/90 (BP 1 Location: Right arm, BP Patient Position: At rest)   Pulse 74   Temp 98.7 ??F (37.1 ??C)   Resp 18   Ht 5\' 7"  (1.702 m)   Wt 118.6 kg (261 lb 8 oz)   SpO2 99%   Breastfeeding No   BMI 40.96 kg/m??     Patient has minimal complaints of pain, minimal nausea noted     Exam:  Appears well in no distress  Lungs- clear bilaterally  Abd - soft, incisions look good without erythema           JP with minimal serosanguinous output  Extremities- no new edema or swelling    UGI - pending    Data Review:    Labs: Results:       Chemistry Recent Labs     09/02/18  0455   GLU 101*   NA 139   K 3.5   CL 106   CO2 28   BUN 5*   CREA 0.38*   CA 8.3*   AGAP 5   BUCR 13   AP 53   TP 5.8*   ALB 2.9*   GLOB 2.9   AGRAT 1.0      CBC w/Diff Recent Labs     09/02/18  0455   WBC 14.8*   RBC 4.23   HGB 12.3   HCT 36.6   PLT 278   GRANS 67   LYMPH 25   EOS 1      Coagulation No results for input(s): PTP, INR, APTT, INREXT in the last 72 hours.    Liver Enzymes Recent Labs     09/02/18  0455   TP 5.8*   ALB 2.9*   AP 53   SGOT 46*          Assessment/Plan: S/P  laparoscopic sleeve gastrectomy - doing well without any issues.  Will go home off all DM meds.    Following orders after UGI confirmed normal -     1.Start bariatric diet and protein shakes  2.D/C IV pain meds and start PO pain meds  3.D/C JP drain  4.Likley PM D/C if  Cont ok and tolerate PO

## 2018-09-02 NOTE — Group Note (Signed)
Diabetes Patient/Family Education Record  Factors That  May Influence Patients Ability  to Learn or  Comply with Recommendations   []    Language barrier    []    Cultural needs   []    Motivation    []    Cognitive limitation    []    Physical   []    Education    []    Physiological factors   []    Hearing/vision/speaking impairment   []    Religious beliefs    []    Financial factors   []   Other:   [x]   No factors identified at this time.     Person Instructed:   [x]    Patient   []    Family   []   Other     Preference for Learning:   [x]    Verbal   []    Written   []   Demonstration     Level of Comprehension & Competence:    [x]   Good                                      []  Fair                                     []   Poor                             []   Needs Reinforcement   [x]   Teachback completed    Education Component: DM in remission s/p bariatric surgery   [x]   Medication management, including confirmation of home regimen; pt will not resume DM meds at home    [x]   Nutritional management -obtain usual meal pattern; followed by OP Bariatric RD   []   Exercise   []   Signs, symptoms, and treatment of hyperglycemia and hypoglycemia   []  Prevention, recognition and treatment of hyperglycemia and hypoglycemia   [x]   Importance of blood glucose monitoring; pt instructed to continue to monitor BG as diet progresses   []   Instruction on use of the blood glucose meter   [x]   Discuss the importance of HbA1C monitoring ; encouraged to continue to monitor    []   Sick day guidelines   []   Proper use and disposal of lancets, needles, syringes or insulin pens (if appropriate)   []   Potential long-term complications (retinopathy, kidney disease, neuropathy, foot care)   []  Information about whom to contact in case of emergency or for more information    []   Goal:  Patient/family will demonstrate understanding of Diabetes Self Management Skills by: (date) _______  Plan for post-discharge education or  self-management support:    []  Outpatient class schedule provided            []  Patient Declined    []  Scheduled for outpatient classes (date) _______  Verify:  Does patient understand how diabetes medications work? ____________________________  Does patient know what their most recent A1c is? ___________________________________  Does patient monitor glucose at home? ___________________________________________  Does patient have difficulty obtaining diabetes medications or testing supplies? _________________           Missy Sabins MS, RN, CDE  Glycemic Control Team  [phone]  Pager [phone] (M-TH 8:00-4:30P)  *After Hours pager [phone]

## 2018-09-02 NOTE — Discharge Summary (Signed)
Discharge Summary    Patient: Alexandria Vasquez               Sex: female          DOA: September 29, 2018         Date of Birth:  [birth year]      Age:  23 y.o.        LOS:  LOS: 1 day                Admit Date: 29-Sep-2018    Discharge Date: 09/02/2018    Admission Diagnoses: Morbid obesity with BMI of 40.0-44.9, adult (HCC) [E66.01, Z68.41]    Discharge Diagnoses:    Problem List as of 09/02/2018 Date Reviewed: 2018/09/29          Codes Class Noted - Resolved    Severe obesity (BMI 35.0-39.9) with comorbidity (HCC) ICD-10-CM: E66.01  ICD-9-CM: 278.01  Unknown - Present        Uses birth control ICD-10-CM: Z78.9  ICD-9-CM: V49.89  Unknown - Present    Overview Signed 07/15/2018  9:27 AM by Mignon Pine, PA     implant             Smoking history ICD-10-CM: Z87.891  ICD-9-CM: V15.82  Unknown - Present    Overview Signed 07/15/2018  9:29 AM by Mignon Pine, PA     quit 2018             Obesity, morbid (HCC) ICD-10-CM: E66.01  ICD-9-CM: 278.01  01/05/2018 - Present        Hypertension ICD-10-CM: I10  ICD-9-CM: 401.9  Unknown - Present        Diabetes (HCC) ICD-10-CM: E11.9  ICD-9-CM: 250.00  Unknown - Present        * (Principal) Morbid obesity with BMI of 40.0-44.9, adult [facility]) ICD-10-CM: E66.01, Z68.41  ICD-9-CM: 278.01, V85.41  01/05/2018 - Present        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 The patient tolerated the procedure well. Vital signs remained stable and the patient was transferred to  3rd floor surgical unit without complications. The patient remained stable throughout the first night post operatively with stable vital signs and adequate urine output and pain control. Pain was controlled with Dilaudid IV and IV Tylenol .  The patient on the first morning post operative was transferred to the radiology suite where they underwent a gastrograffin UGI study  which showed no evidence of a leak or stricture. The drain was discontinued on POD # 1 and the patient was started on a bariatric liquid diet with protein shakes. The patient progressed throughout the day and was ambulating well and tolerating their diet. They were therefore discharged home with instructions to notify me with any issues that may arise.    Significant Diagnostic Studies:   Recent Labs     09/02/18  0455   HGB 12.3     Recent Labs     09/02/18  0455   HCT 36.6       Current Discharge Medication List      CONTINUE these medications which have NOT CHANGED    Details   loratadine (CLARITIN) 10 mg tablet Take 10 mg by mouth daily.      losartan (COZAAR) 25 mg tablet Take  25 mg by mouth daily.      etonogestrel (NEXPLANON) 68 mg impl by SubDERmal route.         STOP taking these medications       glimepiride (AMARYL) 2 mg tablet Comments:   Reason for Stopping:         metFORMIN (GLUCOPHAGE) 500 mg tablet Comments:   Reason for Stopping:               Activity: activity as tolerated with no heavy lifting of greater than 20 pounds.     No anti- inflammatory medications. Use stool softeners at home as needed while taking pain medications since they are constipating.    Diet: Bariatric liquid diet    Wound Care: Keep wound clean and dry, Reinforce dressing PRN and ice to area for comfort. Do not get wound wet for 2 days.    Follow-up: 14 days with Dr Lonia Blood.D

## 2018-09-02 NOTE — Group Note (Signed)
GLYCEMIC CONTROL PROGRESS NOTE:    - known h/o T2DM, HbA1C not within recommended range for age + comorbids on oral home regimen  - s/p gastic sleeve 09/01/18  - BG trended down since yesterday, no corrective coverage required today  Recent Glucose Results:   Lab Results   Component Value Date/Time    GLU 101 (H) 09/02/2018 04:55 AM    GLUCPOC 136 (H) 09/02/2018 12:44 PM    GLUCPOC 113 (H) 09/02/2018 05:13 AM    GLUCPOC 124 (H) 09/02/2018 12:13 AM     Missy Sabins MS, RN, CDE  Glycemic Control Team  [phone]  Pager [phone] (M-TH 8:00-4:30P)  *After Hours pager [phone]

## 2018-09-02 NOTE — Progress Notes (Signed)
AMD   Chaplain addressed Advance Medical Directives.   Chaplain completed visit with patient and offered Pastoral care.   Chaplains will continue to follow and will provide pastoral care as needed or requested    Sister Patsy Lager, [place], [employer]   Spiritual [facility]  [phone]

## 2018-09-03 NOTE — Telephone Encounter (Signed)
This RN spoke with patient post-operatively.     Sipping: Patient is up to sipping 14 ounces.  She awoke at 0700, and this call was made at 1600.  Patient has napped throughout the day, but didn't know how long.  RN encouraged her to push fluids more, as she is significantly behind.  RN will follow-up tomorrow.  Patient verbalized understanding.    Nausea and/or vomitting: None    Pain: Currently managed with Percocet and/or Tylenol.  RN encouraged Percocet at night and Tylenol during the day to avoid napping excessively/not hydrating, provided her pain is managed by this suggestion.  Patient verbalized understanding.    Lovenox injections: Administering every 12 hours, rotating sites.  RN reminded patient to complete all injections, in which patient verbalized understanding.     Lap sites: No erythema, drainage, and/or swelling    JP drain site: No drainage; dressing removed    BM: None to date, but is passing flatus.     Ambulation: Patient is walking throughout house every hour.    IS: Patient continues to use 10x's per hour while awake.    Temperature: 98 degrees    Pulse: 88 bpm    BP: 124/86    BS: 93    Medications: (confirmed currently taking)   *Multi-vitamin: yes   *Probiotic: Will purchase *Prilosec: yes    Questions: None    This RN reminded the patient to contact the office with any questions and/or concerns.  RN also reminded patient they will receive another follow-up TC prior to the two week post-op follow-up appointment.  Patient verbalized understanding to both.  Patient's two week post-op visit is scheduled and was confirmed.

## 2018-09-04 NOTE — Telephone Encounter (Signed)
RN contacted patient re: hydration status.  She awoke at 0700 and has sipped 24 ounces until this call at 1230.

## 2018-09-11 NOTE — Telephone Encounter (Signed)
This RN left a VM in an attempt to follow-up on hydration status. RN requested a return call.

## 2018-09-15 ENCOUNTER — Ambulatory Visit: Attending: Physician Assistant

## 2018-09-15 ENCOUNTER — Institutional Professional Consult (permissible substitution): Admit: 2018-09-15 | Discharge: 2018-09-15 | Payer: PRIVATE HEALTH INSURANCE | Primary: Family Medicine

## 2018-09-15 ENCOUNTER — Ambulatory Visit
Admit: 2018-09-15 | Discharge: 2018-09-15 | Payer: PRIVATE HEALTH INSURANCE | Attending: Physician Assistant | Primary: Family Medicine

## 2018-09-15 DIAGNOSIS — K909 Intestinal malabsorption, unspecified: Secondary | ICD-10-CM

## 2018-09-15 MED ORDER — URSODIOL 500 MG TABLET
500 mg | ORAL_TABLET | Freq: Every day | ORAL | 4 refills | Status: DC
Start: 2018-09-15 — End: 2018-09-30

## 2018-09-15 NOTE — Progress Notes (Signed)
Reviewed diet progression for weeks 3-4.  Patient appears to have a good understanding of the diet progression, food choices, and dietary/exercise habits for successful weight loss and nourishment after surgery. The class material included: post-op diet progression, including soft/pureed high protein low fat, low sugar food recommendations; proper food group choices. We reviewed appropriate food choices, cooking techniques, and eating behavior modifications.  Discussed intake regimen with 3 meals and 2-3 protein supplements per day. Reinforced the importance of adequate fluid with goal of 64 oz per day and adequate protein with goal of 90-100 grams per day.     Kirby E Moir, RD

## 2018-09-15 NOTE — Patient Instructions (Signed)
Patient Instructions      1. Remember hydration goals - minimum of 64 ounces of liquids per day (dehydration is the number one reason for hospital readmission).  2. Sleep 7-9 hours each night to keep your metabolism up.  3. Continue to monitor carbohydrate and protein intake you need a minimum of 90-100 Grams of protein daily- remember to keep your total carbohydrates to 50 grams or less per day for best results.    4. To maximize weight loss keep your caloric intake between 800-1,200 calories daily.  If you are exercising excessively, such as training for a marathon, you need to keep a food log and meet with the dietician so they can advise you on your diet choices, carbohydrate intake and caloric intake.   5. Continue to work towards exercise goals - 60-90 minutes, 5 times a week minimum of deliberate, aerobic exercise is the ultimate goal with strength training 2 times each week. Refer to [employer] flier for personal trainer information.  6. Remember to take vitamins as directed in your handbook.  7. Attend support group the 2nd Thursday of each month.  8. Constipation: Milk of Magnesia is for immediate relief only.  Miralax is to be used every day if constipation is a chronic problem.    9. Diarrhea: patients will occasionally develop lactose intolerance after surgery.  Check to see if your protein shake has whey in it.  If it does try a protein powder or drink that does not have whey and stop all yogurts, cheeses and milks to see if the diarrhea goes away.    10. If you have had labs drawn.  We will only call you if you have abnormal results.  Otherwise you can access the lab results in "mychart".  You will only need the access code the first time you sign on.    11. Call us at [phone] or email us through "[email or web address]" with questions,     concerns or worsening of condition, we have someone on call  24 hours a day.  If you are unable to reach our office, you are to go to your Primary Care Physician or the Emergency Department.     NOTE TO GASTRIC BYPASS PATIENTS:  (SAME APPLIES TO GASTRIC SLEEVE PATIENTS FOR FIRST TWO MONTHS)  Remember that for the rest of your life, you are not able to take the following:  - NSAIDs (ibuprofen, goody powder, BC powder, Motrin, Advil, Mobic, Voltaren, Excedrin, etc.)  - Steroid pills or injections  - Smoke (cigarettes or recreational drugs)  - Alcohol  Use of any of the above may cause ulcers in your stomach which may perforate causing a medical emergency and surgery. Speak to our medical staff if another medical provider requires you to take steroids or NSAIDs.        Supplement Resource Guide    Importance of Protein:   Maintains lean body mass, produces antibodies to fight off infections, heals wounds, minimizes hair loss, helps to give you energy, helps with satiety, and keeping you full between meals.    Importance of Calcium:  Needed for healthy bones and teeth, normal blood clotting, and nervous system functioning, higher risk of osteoporosis and bone disease with non-compliance.    Importance of Multivitamins:  Many functions.  Supply you with extra nutrients that you may be missing from food.  May lead to iron deficiency anemia, weakness, fatigue, and many other symptoms with non-compliance.    Importance of B Vitamins:  Important   for red blood cell formation, metabolism, energy, and helps to maintain a healthy nervous system.    Protein Supplement  Liquid diet phase: consume 90-100g protein daily.    Once you are eating consume  35-50g protein each day from your protein supplement.     0-3 g fat per serving  0-3 g sugar per serving    The body can only absorb 30g of protein at one time, so do not consume more than that at one time.   Multi-vitamin Supplement:    Start immediately after surgery: any complete chewable, such as: Flintstone???s Complete chewables.     Avoid [employer]stone sours or gummies.  They lack iron and other important nutrients and also have added sugar.    Continue with a chewable vitamin or change to an adult complete multivitamin one month after surgery. Menstruating women can take a prenatal vitamin.    Make sure it has at least 18 mg iron and 400-800 mcg folic acid Calcium Supplement:     Start taking within one month after surgery.   Look for:   Calcium Citrate Plus D (1500 mg per day)    Recommend: Citracal    Avoid chocolate chewable calcium.     Can use chewable bariatric or [employer] brand or similar chewable.    The body cannot absorb more than 500-600 mg of calcium at one time.    Take for Life    Vitamin D  Take 3,000 international units daily Vitamin B12  B Complex Vitamin  Start taking both within one month after surgery.     Vitamin B12 (sublingual):    Take 1000 mcg of Vitamin B12 three times weekly    Must take sublingually (meaning you put it under your tongue) or in a liquid drop form for easy absorption.      B Complex Vitamin:   Take one pill daily or liquid drop form daily; as directed on bottle.     Take for Life

## 2018-09-15 NOTE — Progress Notes (Addendum)
Subjective:      Alexandria Vasquez is a 23 y.o. female is now 2 weeks status post laparoscopic sleeve gastrectomy. Doing well overall.  She has lost a total of 18 pounds since surgery.  Body mass index is 36.34 kg/m??. Currently on a liquid diet without difficulty. Taking in 60-70oz water daily.  Sources of protein include protein shakes.  10-15 min of activity 3-4 days a week, including walking.  Patient is sleeping 5-6 hours a night on average.    Bowel movements are regular. The patient is not having any pain.. The patient is compliant with multivitamins.   Surgery related complications: none.  Liver bx report reviewed with patient.  Stomach bx report reviewed with patient.    Weight Loss Metrics 09/15/2018 09/01/2018 08/21/2018 08/10/2018 07/15/2018 07/15/2018 06/18/2018   Today's Wt 232 lb 261 lb 8 oz 250 lb 249 lb 9.6 oz 249 lb 249 lb 249 lb   BMI 36.34 kg/m2 40.96 kg/m2 39.16 kg/m2 39.09 kg/m2 39 kg/m2 39 kg/m2 39 kg/m2          Comorbidities:    Hypertension: improved, on meds, denies lightheadedness or dizziness  Diabetes: improved, off meds, blood glucose levels range 80's to 90's  Obstructive Sleep Apnea: not applicable  Hyperlipidemia: not applicable  Stress Urinary Incontinence: not applicable  Gastroesophageal Reflux: not applicable  Weight related arthropathy:not applicable     Patient Active Problem List   Diagnosis Code   ??? Obesity, morbid ([place]) E66.01   ??? Hypertension I10   ??? Diabetes ([place]) E11.9   ??? 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 Severe obesity (BMI 35.0-39.9) with comorbidity ([place]) E66.01   ??? Uses birth control Z78.9   ??? Smoking history Z87.891   ??? 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 Steatohepatitis K75.81        Past Medical History:   Diagnosis Date   ??? Diabetes ([place])     Dx at age 18 - 2 meds as of fall 2019   ??? Hypertension    ??? Intestinal malabsorption 09/11/2018   ??? 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 Pleas Patricia    ??? Severe obesity (BMI 35.0-39.9) with comorbidity ([place])    ??? Sleep disorder breathing    ??? Smoking history     quit 2018   ??? Steatohepatitis 09/11/2018   ??? Uses birth control     implant       Past Surgical History:   Procedure Laterality Date   ??? HX TONSILLECTOMY  2019   ??? HX WISDOM TEETH EXTRACTION         Current Outpatient Medications   Medication Sig Dispense Refill   ??? multivitamin with iron (FLINTSTONES) chewable tablet Take 1 Tab by mouth daily.     ??? ursodiol (URSO FORTE) 500 mg tablet Take 1 Tab by mouth daily for 30 days. 30 Tab 4   ??? omeprazole (PRILOSEC) 20 mg capsule Take 1 Cap by mouth daily. 30 Cap 3   ??? loratadine (CLARITIN) 10 mg tablet Take 10 mg by mouth daily.     ??? losartan (COZAAR) 25 mg tablet Take 25 mg by mouth daily.     ??? etonogestrel (NEXPLANON) 68 mg impl by SubDERmal route.         No Known Allergies      Review of Systems:  General - No history or complaints of unexpected fever or chills  Head/Neck - No history or  complaints of headache or dizziness  Cardiac - No history or complaints of chest pain, palpitations, or shortness of breath  Pulmonary - No history or complaints of shortness of breath or productive cough  Gastrointestinal - as noted above  Genitourinary - No history or complaints of hematuria/dysuria or renal lithiasis  Musculoskeletal - No history or complaints of joint  muscular weakness  Hematologic - No history of any bleeding episodes  Neurologic - No history or complaints of  migraine headaches or neurologic symptoms    Objective:     Visit Vitals  BP 135/90 (BP 1 Location: Left arm, BP Patient Position: Sitting)   Pulse 76   Temp 97.6 ??F (36.4 ??C)   Ht 5' 7"  (1.702 m)   Wt 105.2 kg (232 lb)   SpO2 100%   BMI 36.34 kg/m??       General:  alert, cooperative, no distress, appears stated age   Chest: lungs clear to auscultation, breath sounds equal and symmetric, no rhonchi, rales or wheezes, no accessory muscle use    Cor:   Regular rate and rhythm or without murmur or extra heart sounds   Abdomen: soft, bowel sounds active, non-tender, no masses or organomegaly   Incisions:   healing well, no drainage, no erythema, no hernia, no seroma, no swelling, no dehiscence, incision well approximated     Recent Results (from the past 2016 hour(s))   [facility] SPECIMEN COL    Collection Time: 08/27/18  9:37 AM   Result Value Ref Range    [facility].        Specimens collected/sent to [facility]. Please direct inquiries to [phone]).   TYPE & SCREEN    Collection Time: 08/27/18  9:38 AM   Result Value Ref Range    Crossmatch Expiration 09/04/2018     ABO/Rh(D) Jenetta Downer POSITIVE     Antibody screen NEG    CBC WITH AUTOMATED DIFF    Collection Time: 08/27/18  9:40 AM   Result Value Ref Range    WBC 16.4 (H) 3.4 - 10.8 x10E3/uL    RBC 4.66 3.77 - 5.28 x10E6/uL    HGB 13.9 11.1 - 15.9 g/dL    HCT 39.5 34.0 - 46.6 %    MCV 85 79 - 97 fL    MCH 29.8 26.6 - 33.0 pg    MCHC 35.2 31.5 - 35.7 g/dL    RDW 14.2 12.3 - 15.4 %    PLATELET 336 150 - 450 x10E3/uL    NEUTROPHILS 70 Not Estab. %    Lymphocytes 23 Not Estab. %    MONOCYTES 5 Not Estab. %    EOSINOPHILS 1 Not Estab. %    BASOPHILS 1 Not Estab. %    ABS. NEUTROPHILS 11.5 (H) 1.4 - 7.0 x10E3/uL    Abs Lymphocytes 3.8 (H) 0.7 - 3.1 x10E3/uL    ABS. MONOCYTES 0.8 0.1 - 0.9 x10E3/uL    ABS. EOSINOPHILS 0.2 0.0 - 0.4 x10E3/uL    ABS. BASOPHILS 0.1 0.0 - 0.2 x10E3/uL    IMMATURE GRANULOCYTES 0 Not Estab. %    ABS. IMM. GRANS. 0.0 0.0 - 0.1 [id number]   METABOLIC PANEL, COMPREHENSIVE    Collection Time: 08/27/18  9:40 AM   Result Value Ref Range    Glucose 210 (H) 65 - 99 mg/dL    BUN 10 6 - 20 mg/dL    Creatinine 0.55 (L) 0.57 - 1.00 mg/dL    GFR est non-AA 135 >59 mL/min/1.73  GFR est AA 156 >59 mL/min/1.73    BUN/Creatinine ratio 18 9 - 23    Sodium 138 134 - 144 mmol/L    Potassium 4.3 3.5 - 5.2 mmol/L    Chloride 101 96 - 106 mmol/L    CO2 20 20 - 29 mmol/L     Calcium 9.4 8.7 - 10.2 mg/dL    Protein, total 6.7 6.0 - 8.5 g/dL    Albumin 4.1 3.5 - 5.5 g/dL    GLOBULIN, TOTAL 2.6 1.5 - 4.5 g/dL    A-G Ratio 1.6 1.2 - 2.2    Bilirubin, total 0.5 0.0 - 1.2 mg/dL    Alk. phosphatase 65 39 - 117 IU/L    AST (SGOT) 16 0 - 40 IU/L    ALT (SGPT) 23 0 - 32 IU/L   HEMOGLOBIN A1C WITH EAG    Collection Time: 08/27/18  9:40 AM   Result Value Ref Range    Hemoglobin A1c 8.4 (H) 4.8 - 5.6 %    Estimated average glucose 194 mg/dL   HCG QL SERUM    Collection Time: 08/27/18  9:40 AM   Result Value Ref Range    hCG,Beta Subunit,Ql. Negative Negative <6 mIU/mL   EKG, 12 LEAD, INITIAL    Collection Time: 08/27/18  1:15 PM   Result Value Ref Range    Ventricular Rate 83 BPM    Atrial Rate 83 BPM    P-R Interval 146 ms    QRS Duration 82 ms    Q-T Interval 358 ms    QTC Calculation (Bezet) 420 ms    Calculated P Axis 20 degrees    Calculated R Axis 34 degrees    Calculated T Axis 30 degrees    Diagnosis       Normal sinus rhythm  Normal ECG  Confirmed by Berton Lan MD, Tamera Reason ([id number]) on 08/28/2018 8:11:27 AM     GLUCOSE, POC    Collection Time: 09/01/18  7:57 AM   Result Value Ref Range    Glucose (POC) 202 (H) 70 - 110 mg/dL   HCG URINE, QL. - POC    Collection Time: 09/01/18  8:00 AM   Result Value Ref Range    Pregnancy test,urine (POC) NEGATIVE  NEG     GLUCOSE, POC    Collection Time: 09/01/18  9:23 AM   Result Value Ref Range    Glucose (POC) 132 (H) 70 - 110 mg/dL   GLUCOSE, POC    Collection Time: 09/01/18 12:20 PM   Result Value Ref Range    Glucose (POC) 197 (H) 70 - 110 mg/dL   GLUCOSE, POC    Collection Time: 09/01/18  6:14 PM   Result Value Ref Range    Glucose (POC) 198 (H) 70 - 110 mg/dL   GLUCOSE, POC    Collection Time: 09/02/18 12:13 AM   Result Value Ref Range    Glucose (POC) 124 (H) 70 - 110 mg/dL   CBC WITH AUTOMATED DIFF    Collection Time: 09/02/18  4:55 AM   Result Value Ref Range    WBC 14.8 (H) 4.6 - 13.2 K/uL    RBC 4.23 4.20 - 5.30 M/uL    HGB 12.3 12.0 - 16.0 g/dL     HCT 36.6 35.0 - 45.0 %    MCV 86.5 74.0 - 97.0 FL    MCH 29.1 24.0 - 34.0 PG    MCHC 33.6 31.0 - 37.0 g/dL    RDW 14.9 (H) 11.6 -  14.5 %    PLATELET 278 135 - 420 K/uL    MPV 9.7 9.2 - 11.8 FL    NEUTROPHILS 67 40 - 73 %    LYMPHOCYTES 25 21 - 52 %    MONOCYTES 7 3 - 10 %    EOSINOPHILS 1 0 - 5 %    BASOPHILS 0 0 - 2 %    ABS. NEUTROPHILS 10.0 (H) 1.8 - 8.0 K/UL    ABS. LYMPHOCYTES 3.7 (H) 0.9 - 3.6 K/UL    ABS. MONOCYTES 1.0 0.05 - 1.2 K/UL    ABS. EOSINOPHILS 0.1 0.0 - 0.4 K/UL    ABS. BASOPHILS 0.0 0.0 - 0.1 K/UL    DF AUTOMATED     METABOLIC PANEL, COMPREHENSIVE    Collection Time: 09/02/18  4:55 AM   Result Value Ref Range    Sodium 139 136 - 145 mmol/L    Potassium 3.5 3.5 - 5.5 mmol/L    Chloride 106 100 - 111 mmol/L    CO2 28 21 - 32 mmol/L    Anion gap 5 3.0 - 18 mmol/L    Glucose 101 (H) 74 - 99 mg/dL    BUN 5 (L) 7.0 - 18 MG/DL    Creatinine 0.38 (L) 0.6 - 1.3 MG/DL    BUN/Creatinine ratio 13 12 - 20      GFR est AA >60 >60 ml/min/1.60m    GFR est non-AA >60 >60 ml/min/1.756m   Calcium 8.3 (L) 8.5 - 10.1 MG/DL    Bilirubin, total 0.7 0.2 - 1.0 MG/DL    ALT (SGPT) 68 (H) 13 - 56 U/L    AST (SGOT) 46 (H) 10 - 38 U/L    Alk. phosphatase 53 45 - 117 U/L    Protein, total 5.8 (L) 6.4 - 8.2 g/dL    Albumin 2.9 (L) 3.4 - 5.0 g/dL    Globulin 2.9 2.0 - 4.0 g/dL    A-G Ratio 1.0 0.8 - 1.7     GLUCOSE, POC    Collection Time: 09/02/18  5:13 AM   Result Value Ref Range    Glucose (POC) 113 (H) 70 - 110 mg/dL   GLUCOSE, POC    Collection Time: 09/02/18 12:44 PM   Result Value Ref Range    Glucose (POC) 136 (H) 70 - 110 mg/dL       Pathology:  A: STOMACH, GASTRIC SLEEVE RESECTION:   SEGMENT OF BENIGN STOMACH.     B: LIVER, WEDGE BIOPSY:   MODERATE STEATOSIS WITH FEATURES OF MILD STEATOHEPATITIS.   NO EVIDENCE OF SIGNIFICANT FIBROSIS OR CIRRHOSIS.     C: STOMACH, PREPYLORIC BIOPSIES:   BENIGN GASTRIC MUCOSA.   HELICOBACTER-LIKE ORGANISMS ARE NOT IDENTIFIED.     Assessment:    History of Morbid obesity, status post laparoscopic sleeve gastrectomy.  Doing well postoperatively.  Steatohepatitis - referral to hepatology, Dr. ShHetty ElyExercise a minimum of 30 minutes daily.  Sleep goal is 7-9 hours each night.  Patient education given on the effects of sleep deprivation on weight control.  HTN - Continue meds and follow up with PCP or cardiology  DM - continue to monitor blood glucose levels and f/u with endocrinology    Plan:     1. Increase activity to the goal of 30 minutes daily  2. Advance diet to soft solid phase. Reminded to measure portions, continue high protein, low carbohydrate diet.  Reminded to eat regularly, to eat slowly & not to drink with meals.  Refer to the handbook given in class.  3. Sleep goal is 7-9 hours each night.  Patient education given on the effects of sleep deprivation  4. Continue multivitamin   5. Continue current medications and follow up with PCP for management of regimen.   6. Encouraged to attend support group   7. I have discussed this plan with patient and they verbalized understanding  8. Follow up in 2 weeks or sooner if patient has questions, concerns or worsening of condition, if unable to reach our office, patient should report to the ED.                                                                               50. Ms. Coderre has a reminder for a "due or due soon" health maintenance. I have asked that she contact her primary care provider for a follow-up on this health maintenance.

## 2018-09-15 NOTE — Progress Notes (Signed)
Alexandria Vasquez presents today for   Chief Complaint   Patient presents with   ??? Follow-up     Pt is here today for her follow up       Is someone accompanying this pt? yes    Is the patient using any DME equipment during OV? no    Depression Screening:  3 most recent PHQ Screens 08/10/2018   Little interest or pleasure in doing things Not at all   Feeling down, depressed, irritable, or hopeless Not at all   Total Score PHQ 2 0       Learning Assessment:  Learning Assessment 07/15/2018   PRIMARY LEARNER Patient   HIGHEST LEVEL OF EDUCATION - PRIMARY LEARNER  GRADUATED HIGH SCHOOL OR GED   BARRIERS PRIMARY LEARNER NONE   CO-LEARNER CAREGIVER No   PRIMARY LANGUAGE ENGLISH   INTERPRETER NEED No   LEARNER PREFERENCE PRIMARY DEMONSTRATION   LEARNING SPECIAL TOPICS no   ANSWERED BY patient   RELATIONSHIP SELF       Abuse Screening:  Abuse Screening Questionnaire 07/15/2018   Do you ever feel afraid of your partner? N   Are you in a relationship with someone who physically or mentally threatens you? N   Is it safe for you to go home? Y       Fall Risk  No flowsheet data found.      Coordination of Care:  1. Have you been to the ER, urgent care clinic since your last visit? Hospitalized since your last visit? no    2. Have you seen or consulted any other health care providers outside of the [facility] since your last visit? Include any pap smears or colon screening. no

## 2018-09-15 NOTE — Progress Notes (Signed)
Subjective:      Alexandria Vasquez is a 23 y.o. female is now 2 weeks status post laparoscopic sleeve gastrectomy. Doing well overall.  She has lost a total of 18 pounds since surgery.  Body mass index is 36.34 kg/m??. Currently on a liquid diet without difficulty. Taking in 60-70oz water daily.  Sources of protein include protein shakes.  10-15 min of activity 3-4 days a week, including walking.  Patient is sleeping 5-6 hours a night on average.    Bowel movements are regular. The patient is not having any pain.. The patient is compliant with multivitamins.   Surgery related complications: none.  Liver bx report reviewed with patient.  Stomach bx report reviewed with patient.    Weight Loss Metrics 09/15/2018 09/01/2018 08/21/2018 08/10/2018 07/15/2018 07/15/2018 06/18/2018   Today's Wt 232 lb 261 lb 8 oz 250 lb 249 lb 9.6 oz 249 lb 249 lb 249 lb   BMI 36.34 kg/m2 40.96 kg/m2 39.16 kg/m2 39.09 kg/m2 39 kg/m2 39 kg/m2 39 kg/m2          Comorbidities:    Hypertension: improved, on meds, denies lightheadedness or dizziness  Diabetes: improved, off meds, blood glucose levels range 80's to 90's  Obstructive Sleep Apnea: not applicable  Hyperlipidemia: not applicable  Stress Urinary Incontinence: not applicable  Gastroesophageal Reflux: not applicable  Weight related arthropathy:not applicable     Patient Active Problem List   Diagnosis Code   ??? Obesity, morbid ([place]) E66.01   ??? Hypertension I10   ??? Diabetes ([place]) E11.9   ??? 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 Severe obesity (BMI 35.0-39.9) with comorbidity ([place]) E66.01   ??? Uses birth control Z78.9   ??? Smoking history Z87.891   ??? 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 Steatohepatitis K75.81        Past Medical History:   Diagnosis Date   ??? Diabetes ([place])     Dx at age 65 - 2 meds as of fall 2019   ??? Hypertension    ??? Intestinal malabsorption 09/11/2018   ??? 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 Pleas Patricia   ???  Severe obesity (BMI 35.0-39.9) with comorbidity ([place])    ??? Sleep disorder breathing    ??? Smoking history     quit 2018   ??? Steatohepatitis 09/11/2018   ??? Uses birth control     implant       Past Surgical History:   Procedure Laterality Date   ??? HX TONSILLECTOMY  2019   ??? HX WISDOM TEETH EXTRACTION         Current Outpatient Medications   Medication Sig Dispense Refill   ??? multivitamin with iron (FLINTSTONES) chewable tablet Take 1 Tab by mouth daily.     ??? ursodiol (URSO FORTE) 500 mg tablet Take 1 Tab by mouth daily for 30 days. 30 Tab 4   ??? omeprazole (PRILOSEC) 20 mg capsule Take 1 Cap by mouth daily. 30 Cap 3   ??? loratadine (CLARITIN) 10 mg tablet Take 10 mg by mouth daily.     ??? losartan (COZAAR) 25 mg tablet Take 25 mg by mouth daily.     ??? etonogestrel (NEXPLANON) 68 mg impl by SubDERmal route.         No Known Allergies      Review of Systems:  General - No history or complaints of unexpected fever or chills  Head/Neck - No history or  complaints of headache or dizziness  Cardiac - No history or complaints of chest pain, palpitations, or shortness of breath  Pulmonary - No history or complaints of shortness of breath or productive cough  Gastrointestinal - as noted above  Genitourinary - No history or complaints of hematuria/dysuria or renal lithiasis  Musculoskeletal - No history or complaints of joint  muscular weakness  Hematologic - No history of any bleeding episodes  Neurologic - No history or complaints of  migraine headaches or neurologic symptoms    Objective:     Visit Vitals  BP 135/90 (BP 1 Location: Left arm, BP Patient Position: Sitting)   Pulse 76   Temp 97.6 ??F (36.4 ??C)   Ht '5\' 7"'  (1.702 m)   Wt 105.2 kg (232 lb)   SpO2 100%   BMI 36.34 kg/m??       General:  alert, cooperative, no distress, appears stated age   Chest: lungs clear to auscultation, breath sounds equal and symmetric, no rhonchi, rales or wheezes, no accessory muscle use   Cor:   Regular rate and rhythm or without murmur or extra  heart sounds   Abdomen: soft, bowel sounds active, non-tender, no masses or organomegaly   Incisions:   healing well, no drainage, no erythema, no hernia, no seroma, no swelling, no dehiscence, incision well approximated     Recent Results (from the past 2016 hour(s))   [facility] SPECIMEN COL    Collection Time: 08/27/18  9:37 AM   Result Value Ref Range    [facility].        Specimens collected/sent to [facility]. Please direct inquiries to [phone]).   TYPE & SCREEN    Collection Time: 08/27/18  9:38 AM   Result Value Ref Range    Crossmatch Expiration 09/04/2018     ABO/Rh(D) Jenetta Downer POSITIVE     Antibody screen NEG    CBC WITH AUTOMATED DIFF    Collection Time: 08/27/18  9:40 AM   Result Value Ref Range    WBC 16.4 (H) 3.4 - 10.8 x10E3/uL    RBC 4.66 3.77 - 5.28 x10E6/uL    HGB 13.9 11.1 - 15.9 g/dL    HCT 39.5 34.0 - 46.6 %    MCV 85 79 - 97 fL    MCH 29.8 26.6 - 33.0 pg    MCHC 35.2 31.5 - 35.7 g/dL    RDW 14.2 12.3 - 15.4 %    PLATELET 336 150 - 450 x10E3/uL    NEUTROPHILS 70 Not Estab. %    Lymphocytes 23 Not Estab. %    MONOCYTES 5 Not Estab. %    EOSINOPHILS 1 Not Estab. %    BASOPHILS 1 Not Estab. %    ABS. NEUTROPHILS 11.5 (H) 1.4 - 7.0 x10E3/uL    Abs Lymphocytes 3.8 (H) 0.7 - 3.1 x10E3/uL    ABS. MONOCYTES 0.8 0.1 - 0.9 x10E3/uL    ABS. EOSINOPHILS 0.2 0.0 - 0.4 x10E3/uL    ABS. BASOPHILS 0.1 0.0 - 0.2 x10E3/uL    IMMATURE GRANULOCYTES 0 Not Estab. %    ABS. IMM. GRANS. 0.0 0.0 - 0.1 G86P6/PP   METABOLIC PANEL, COMPREHENSIVE    Collection Time: 08/27/18  9:40 AM   Result Value Ref Range    Glucose 210 (H) 65 - 99 mg/dL    BUN 10 6 - 20 mg/dL    Creatinine 0.55 (L) 0.57 - 1.00 mg/dL    GFR est non-AA 135 >59 mL/min/1.73  GFR est AA 156 >59 mL/min/1.73    BUN/Creatinine ratio 18 9 - 23    Sodium 138 134 - 144 mmol/L    Potassium 4.3 3.5 - 5.2 mmol/L    Chloride 101 96 - 106 mmol/L    CO2 20 20 - 29 mmol/L    Calcium 9.4 8.7 - 10.2 mg/dL    Protein, total 6.7 6.0 - 8.5 g/dL    Albumin 4.1 3.5 -  5.5 g/dL    GLOBULIN, TOTAL 2.6 1.5 - 4.5 g/dL    A-G Ratio 1.6 1.2 - 2.2    Bilirubin, total 0.5 0.0 - 1.2 mg/dL    Alk. phosphatase 65 39 - 117 IU/L    AST (SGOT) 16 0 - 40 IU/L    ALT (SGPT) 23 0 - 32 IU/L   HEMOGLOBIN A1C WITH EAG    Collection Time: 08/27/18  9:40 AM   Result Value Ref Range    Hemoglobin A1c 8.4 (H) 4.8 - 5.6 %    Estimated average glucose 194 mg/dL   HCG QL SERUM    Collection Time: 08/27/18  9:40 AM   Result Value Ref Range    hCG,Beta Subunit,Ql. Negative Negative <6 mIU/mL   EKG, 12 LEAD, INITIAL    Collection Time: 08/27/18  1:15 PM   Result Value Ref Range    Ventricular Rate 83 BPM    Atrial Rate 83 BPM    P-R Interval 146 ms    QRS Duration 82 ms    Q-T Interval 358 ms    QTC Calculation (Bezet) 420 ms    Calculated P Axis 20 degrees    Calculated R Axis 34 degrees    Calculated T Axis 30 degrees    Diagnosis       Normal sinus rhythm  Normal ECG  Confirmed by Berton Lan MD, Tamera Reason ([id number]) on 08/28/2018 8:11:27 AM     GLUCOSE, POC    Collection Time: 09/01/18  7:57 AM   Result Value Ref Range    Glucose (POC) 202 (H) 70 - 110 mg/dL   HCG URINE, QL. - POC    Collection Time: 09/01/18  8:00 AM   Result Value Ref Range    Pregnancy test,urine (POC) NEGATIVE  NEG     GLUCOSE, POC    Collection Time: 09/01/18  9:23 AM   Result Value Ref Range    Glucose (POC) 132 (H) 70 - 110 mg/dL   GLUCOSE, POC    Collection Time: 09/01/18 12:20 PM   Result Value Ref Range    Glucose (POC) 197 (H) 70 - 110 mg/dL   GLUCOSE, POC    Collection Time: 09/01/18  6:14 PM   Result Value Ref Range    Glucose (POC) 198 (H) 70 - 110 mg/dL   GLUCOSE, POC    Collection Time: 09/02/18 12:13 AM   Result Value Ref Range    Glucose (POC) 124 (H) 70 - 110 mg/dL   CBC WITH AUTOMATED DIFF    Collection Time: 09/02/18  4:55 AM   Result Value Ref Range    WBC 14.8 (H) 4.6 - 13.2 K/uL    RBC 4.23 4.20 - 5.30 M/uL    HGB 12.3 12.0 - 16.0 g/dL    HCT 36.6 35.0 - 45.0 %    MCV 86.5 74.0 - 97.0 FL    MCH 29.1 24.0 - 34.0 PG    MCHC 33.6  31.0 - 37.0 g/dL    RDW 14.9 (H) 11.6 -  14.5 %    PLATELET 278 135 - 420 K/uL    MPV 9.7 9.2 - 11.8 FL    NEUTROPHILS 67 40 - 73 %    LYMPHOCYTES 25 21 - 52 %    MONOCYTES 7 3 - 10 %    EOSINOPHILS 1 0 - 5 %    BASOPHILS 0 0 - 2 %    ABS. NEUTROPHILS 10.0 (H) 1.8 - 8.0 K/UL    ABS. LYMPHOCYTES 3.7 (H) 0.9 - 3.6 K/UL    ABS. MONOCYTES 1.0 0.05 - 1.2 K/UL    ABS. EOSINOPHILS 0.1 0.0 - 0.4 K/UL    ABS. BASOPHILS 0.0 0.0 - 0.1 K/UL    DF AUTOMATED     METABOLIC PANEL, COMPREHENSIVE    Collection Time: 09/02/18  4:55 AM   Result Value Ref Range    Sodium 139 136 - 145 mmol/L    Potassium 3.5 3.5 - 5.5 mmol/L    Chloride 106 100 - 111 mmol/L    CO2 28 21 - 32 mmol/L    Anion gap 5 3.0 - 18 mmol/L    Glucose 101 (H) 74 - 99 mg/dL    BUN 5 (L) 7.0 - 18 MG/DL    Creatinine 0.38 (L) 0.6 - 1.3 MG/DL    BUN/Creatinine ratio 13 12 - 20      GFR est AA >60 >60 ml/min/1.13m    GFR est non-AA >60 >60 ml/min/1.771m   Calcium 8.3 (L) 8.5 - 10.1 MG/DL    Bilirubin, total 0.7 0.2 - 1.0 MG/DL    ALT (SGPT) 68 (H) 13 - 56 U/L    AST (SGOT) 46 (H) 10 - 38 U/L    Alk. phosphatase 53 45 - 117 U/L    Protein, total 5.8 (L) 6.4 - 8.2 g/dL    Albumin 2.9 (L) 3.4 - 5.0 g/dL    Globulin 2.9 2.0 - 4.0 g/dL    A-G Ratio 1.0 0.8 - 1.7     GLUCOSE, POC    Collection Time: 09/02/18  5:13 AM   Result Value Ref Range    Glucose (POC) 113 (H) 70 - 110 mg/dL   GLUCOSE, POC    Collection Time: 09/02/18 12:44 PM   Result Value Ref Range    Glucose (POC) 136 (H) 70 - 110 mg/dL       Pathology:  A: STOMACH, GASTRIC SLEEVE RESECTION:   SEGMENT OF BENIGN STOMACH.     B: LIVER, WEDGE BIOPSY:   MODERATE STEATOSIS WITH FEATURES OF MILD STEATOHEPATITIS.   NO EVIDENCE OF SIGNIFICANT FIBROSIS OR CIRRHOSIS.     C: STOMACH, PREPYLORIC BIOPSIES:   BENIGN GASTRIC MUCOSA.   HELICOBACTER-LIKE ORGANISMS ARE NOT IDENTIFIED.     Assessment:   History of Morbid obesity, status post laparoscopic sleeve gastrectomy.  Doing well postoperatively.  Steatohepatitis - referral to  hepatology, Dr. ShHetty ElyExercise a minimum of 30 minutes daily.  Sleep goal is 7-9 hours each night.  Patient education given on the effects of sleep deprivation on weight control.  HTN - Continue meds and follow up with PCP or cardiology  DM - continue to monitor blood glucose levels and f/u with endocrinology    Plan:     1. Increase activity to the goal of 30 minutes daily  2. Advance diet to soft solid phase. Reminded to measure portions, continue high protein, low carbohydrate diet.  Reminded to eat regularly, to eat slowly & not to drink with meals.  Refer to the handbook given in class.  3. Sleep goal is 7-9 hours each night.  Patient education given on the effects of sleep deprivation  4. Continue multivitamin   5. Continue current medications and follow up with PCP for management of regimen.   6. Encouraged to attend support group   7. I have discussed this plan with patient and they verbalized understanding  8. Follow up in 2 weeks or sooner if patient has questions, concerns or worsening of condition, if unable to reach our office, patient should report to the ED.                                                                               64. Alexandria Vasquez has a reminder for a "due or due soon" health maintenance. I have asked that she contact her primary care provider for a follow-up on this health maintenance.

## 2018-09-15 NOTE — Progress Notes (Signed)
 Alexandria Vasquez presents today for   Chief Complaint   Patient presents with   . Follow-up     Pt is here today for her follow up       Is someone accompanying this pt? yes    Is the patient using any DME equipment during OV? no    Depression Screening:  3 most recent PHQ Screens 08/10/2018   Little interest or pleasure in doing things Not at all   Feeling down, depressed, irritable, or hopeless Not at all   Total Score PHQ 2 0       Learning Assessment:  Learning Assessment 07/15/2018   PRIMARY LEARNER Patient   HIGHEST LEVEL OF EDUCATION - PRIMARY LEARNER  GRADUATED HIGH SCHOOL OR GED   BARRIERS PRIMARY LEARNER NONE   CO-LEARNER CAREGIVER No   PRIMARY LANGUAGE ENGLISH   INTERPRETER NEED No   LEARNER PREFERENCE PRIMARY DEMONSTRATION   LEARNING SPECIAL TOPICS no   ANSWERED BY patient   RELATIONSHIP SELF       Abuse Screening:  Abuse Screening Questionnaire 07/15/2018   Do you ever feel afraid of your partner? N   Are you in a relationship with someone who physically or mentally threatens you? N   Is it safe for you to go home? Y       Fall Risk  No flowsheet data found.      Coordination of Care:  1. Have you been to the ER, urgent care clinic since your last visit? Hospitalized since your last visit? no    2. Have you seen or consulted any other health care providers outside of the [facility] since your last visit? Include any pap smears or colon screening. no

## 2018-09-15 NOTE — Progress Notes (Signed)
 Reviewed diet progression for weeks 3-4.  Patient appears to have a good understanding of the diet progression, food choices, and dietary/exercise habits for successful weight loss and nourishment after surgery. The class material included: post-op diet progression, including soft/pureed high protein low fat, low sugar food recommendations; proper food group choices. We reviewed appropriate food choices, cooking techniques, and eating behavior modifications.  Discussed intake regimen with 3 meals and 2-3 protein supplements per day. Reinforced the importance of adequate fluid with goal of 64 oz per day and adequate protein with goal of 90-100 grams per day.     Jamey Reas, RD

## 2018-09-30 ENCOUNTER — Ambulatory Visit

## 2018-09-30 ENCOUNTER — Ambulatory Visit: Attending: Physician Assistant

## 2018-09-30 ENCOUNTER — Ambulatory Visit: Admit: 2018-09-30 | Discharge: 2018-09-30 | Payer: PRIVATE HEALTH INSURANCE | Primary: Family Medicine

## 2018-09-30 ENCOUNTER — Ambulatory Visit
Admit: 2018-09-30 | Discharge: 2018-09-30 | Payer: PRIVATE HEALTH INSURANCE | Attending: Physician Assistant | Primary: Family Medicine

## 2018-09-30 DIAGNOSIS — Z9884 Bariatric surgery status: Secondary | ICD-10-CM

## 2018-09-30 DIAGNOSIS — K909 Intestinal malabsorption, unspecified: Secondary | ICD-10-CM

## 2018-09-30 NOTE — Patient Instructions (Signed)
Patient Instructions      1. Remember hydration goals - minimum of 64 ounces of liquids per day (dehydration is the number one reason for hospital readmission).  2. Sleep 7-9 hours each night to keep your metabolism up.  3. Continue to monitor carbohydrate and protein intake you need a minimum of 90-100 Grams of protein daily- remember to keep your total carbohydrates to 50 grams or less per day for best results.    4. To maximize weight loss keep your caloric intake between 800-1,200 calories daily.  If you are exercising excessively, such as training for a marathon, you need to keep a food log and meet with the dietician so they can advise you on your diet choices, carbohydrate intake and caloric intake.   5. Continue to work towards exercise goals - 60-90 minutes, 5 times a week minimum of deliberate, aerobic exercise is the ultimate goal with strength training 2 times each week. Refer to [employer] flier for personal trainer information.  6. Remember to take vitamins as directed in your handbook.  7. Attend support group the 2nd Thursday of each month.  8. Constipation: Milk of Magnesia is for immediate relief only.  Miralax is to be used every day if constipation is a chronic problem.    9. Diarrhea: patients will occasionally develop lactose intolerance after surgery.  Check to see if your protein shake has whey in it.  If it does try a protein powder or drink that does not have whey and stop all yogurts, cheeses and milks to see if the diarrhea goes away.    10. If you have had labs drawn.  We will only call you if you have abnormal results.  Otherwise you can access the lab results in "mychart".  You will only need the access code the first time you sign on.    11. Call us at [phone] or email us through "[email or web address]" with questions,     concerns or worsening of condition, we have someone on call  24 hours a day.  If you are unable to reach our office, you are to go to your Primary Care Physician or the Emergency Department.     NOTE TO GASTRIC BYPASS PATIENTS:  (SAME APPLIES TO GASTRIC SLEEVE PATIENTS FOR FIRST TWO MONTHS)  Remember that for the rest of your life, you are not able to take the following:  - NSAIDs (ibuprofen, goody powder, BC powder, Motrin, Advil, Mobic, Voltaren, Excedrin, etc.)  - Steroid pills or injections  - Smoke (cigarettes or recreational drugs)  - Alcohol  Use of any of the above may cause ulcers in your stomach which may perforate causing a medical emergency and surgery. Speak to our medical staff if another medical provider requires you to take steroids or NSAIDs.        Supplement Resource Guide    Importance of Protein:   Maintains lean body mass, produces antibodies to fight off infections, heals wounds, minimizes hair loss, helps to give you energy, helps with satiety, and keeping you full between meals.    Importance of Calcium:  Needed for healthy bones and teeth, normal blood clotting, and nervous system functioning, higher risk of osteoporosis and bone disease with non-compliance.    Importance of Multivitamins:  Many functions.  Supply you with extra nutrients that you may be missing from food.  May lead to iron deficiency anemia, weakness, fatigue, and many other symptoms with non-compliance.    Importance of B Vitamins:  Important   for red blood cell formation, metabolism, energy, and helps to maintain a healthy nervous system.    Protein Supplement  Liquid diet phase: consume 90-100g protein daily.    Once you are eating consume  35-50g protein each day from your protein supplement.     0-3 g fat per serving  0-3 g sugar per serving    The body can only absorb 30g of protein at one time, so do not consume more than that at one time.   Multi-vitamin Supplement:    Start immediately after surgery: any complete chewable, such as: Flintstone???s Complete chewables.     Avoid [employer]stone sours or gummies.  They lack iron and other important nutrients and also have added sugar.    Continue with a chewable vitamin or change to an adult complete multivitamin one month after surgery. Menstruating women can take a prenatal vitamin.    Make sure it has at least 18 mg iron and 400-800 mcg folic acid Calcium Supplement:     Start taking within one month after surgery.   Look for:   Calcium Citrate Plus D (1500 mg per day)    Recommend: Citracal    Avoid chocolate chewable calcium.     Can use chewable bariatric or [employer] brand or similar chewable.    The body cannot absorb more than 500-600 mg of calcium at one time.    Take for Life    Vitamin D  Take 3,000 international units daily Vitamin B12  B Complex Vitamin  Start taking both within one month after surgery.     Vitamin B12 (sublingual):    Take 1000 mcg of Vitamin B12 three times weekly    Must take sublingually (meaning you put it under your tongue) or in a liquid drop form for easy absorption.      B Complex Vitamin:   Take one pill daily or liquid drop form daily; as directed on bottle.     Take for Life

## 2018-09-30 NOTE — Progress Notes (Signed)
Subjective:      Alexandria Vasquez is a 23 y.o. 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 Doing well overall.  She has lost a total of 20 pounds since surgery.  Body mass index is 36.02 kg/m??.  Has lost 18% of EBW.    Currently on a soft food diet without difficulty, reports no issues and denies vomiting and abdominal pain. Taking in 90+oz water daily.  Sources of protein include protein shakes and chicken.    15 min of activity 2 days a week, including walking.  Patient is sleeping 7-8 hours a night on average.  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     Bowel movements are regular. The patient is not having any pain.. The patient is compliant with multivitamins.     Weight Loss Metrics 09/30/2018 09/15/2018 09/01/2018 08/21/2018 08/10/2018 07/15/2018 07/15/2018   Today's Wt 230 lb 232 lb 261 lb 8 oz 250 lb 249 lb 9.6 oz 249 lb 249 lb   BMI 36.02 kg/m2 36.34 kg/m2 40.96 kg/m2 39.16 kg/m2 39.09 kg/m2 39 kg/m2 39 kg/m2          Comorbidities:    Hypertension: improved, on meds, denies lightheadedness or dizziness  Diabetes: improved, off meds, blood glucose levels range 80's to 90's  Obstructive Sleep Apnea: not applicable  Hyperlipidemia: not applicable  Stress Urinary Incontinence: not applicable  Gastroesophageal Reflux: not applicable  Weight related arthropathy:not applicable     Patient Active Problem List   Diagnosis Code   ??? Obesity, morbid ([place]) E66.01   ??? Hypertension I10   ??? Diabetes ([place]) E11.9   ??? 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 Severe obesity (BMI 35.0-39.9) with comorbidity ([place]) E66.01   ??? Uses birth control Z78.9   ??? Smoking history Z87.891   ??? 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 Steatohepatitis K75.81        Past Medical History:   Diagnosis Date   ??? Diabetes ([place])     Dx at age 51 - 2 meds as of fall 2019   ??? Hypertension    ??? Intestinal malabsorption 09/11/2018    ??? 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 Pleas Patricia   ??? Severe obesity (BMI 35.0-39.9) with comorbidity ([place])    ??? Sleep disorder breathing    ??? Smoking history     quit 2018   ??? Steatohepatitis 09/11/2018   ??? Uses birth control     implant       Past Surgical History:   Procedure Laterality Date   ??? HX TONSILLECTOMY  2019   ??? HX WISDOM TEETH EXTRACTION         Current Outpatient Medications   Medication Sig Dispense Refill   ??? multivitamin with iron (FLINTSTONES) chewable tablet Take 1 Tab by mouth daily.     ??? omeprazole (PRILOSEC) 20 mg capsule Take 1 Cap by mouth daily. 30 Cap 3   ??? loratadine (CLARITIN) 10 mg tablet Take 10 mg by mouth daily.     ??? losartan (COZAAR) 25 mg tablet Take 25 mg by mouth daily.     ??? etonogestrel (NEXPLANON) 68 mg impl by SubDERmal route.     ??? ursodiol (URSO FORTE) 500 mg tablet Take 1 Tab by mouth daily for 30 days. 30 Tab 4       No Known Allergies    Review of Systems:  General - No history or complaints of unexpected fever or chills  Head/Neck - No history or complaints of headache or dizziness  Cardiac - No history or complaints of chest pain, palpitations, or shortness of breath  Pulmonary - No history or complaints of shortness of breath or productive cough  Gastrointestinal - as noted above  Genitourinary - No history or complaints of hematuria/dysuria or renal lithiasis  Musculoskeletal - No history or complaints of joint  muscular weakness  Hematologic - No history of any bleeding episodes  Neurologic - No history or complaints of  migraine headaches or neurologic symptoms    Objective:     Visit Vitals  BP 116/62 (BP 1 Location: Left arm, BP Patient Position: Sitting)   Pulse 81   Temp 98.4 ??F (36.9 ??C)   Ht 5' 7"  (1.702 m)   Wt 104.3 kg (230 lb)   SpO2 100%   BMI 36.02 kg/m??       General:  alert, cooperative, no distress, appears stated age   Chest: lungs clear to auscultation, breath sounds equal and symmetric, no  rhonchi, rales or wheezes, no accessory muscle use   Cor:   Regular rate and rhythm or without murmur or extra heart sounds   Abdomen: soft, bowel sounds active, non-tender, no masses or organomegaly   Incisions:   healing well, no drainage, no erythema, no hernia, no seroma, no swelling, no dehiscence, incision well approximated       Recent Results (from the past 2016 hour(s))   [facility] SPECIMEN COL    Collection Time: 08/27/18  9:37 AM   Result Value Ref Range    [facility] SPECIMEN COLLN.        Specimens collected/sent to [facility]. Please direct inquiries to [phone]).   TYPE & SCREEN    Collection Time: 08/27/18  9:38 AM   Result Value Ref Range    Crossmatch Expiration 09/04/2018     ABO/Rh(D) Jenetta Downer POSITIVE     Antibody screen NEG    CBC WITH AUTOMATED DIFF    Collection Time: 08/27/18  9:40 AM   Result Value Ref Range    WBC 16.4 (H) 3.4 - 10.8 x10E3/uL    RBC 4.66 3.77 - 5.28 x10E6/uL    HGB 13.9 11.1 - 15.9 g/dL    HCT 39.5 34.0 - 46.6 %    MCV 85 79 - 97 fL    MCH 29.8 26.6 - 33.0 pg    MCHC 35.2 31.5 - 35.7 g/dL    RDW 14.2 12.3 - 15.4 %    PLATELET 336 150 - 450 x10E3/uL    NEUTROPHILS 70 Not Estab. %    Lymphocytes 23 Not Estab. %    MONOCYTES 5 Not Estab. %    EOSINOPHILS 1 Not Estab. %    BASOPHILS 1 Not Estab. %    ABS. NEUTROPHILS 11.5 (H) 1.4 - 7.0 x10E3/uL    Abs Lymphocytes 3.8 (H) 0.7 - 3.1 x10E3/uL    ABS. MONOCYTES 0.8 0.1 - 0.9 x10E3/uL    ABS. EOSINOPHILS 0.2 0.0 - 0.4 x10E3/uL    ABS. BASOPHILS 0.1 0.0 - 0.2 x10E3/uL    IMMATURE GRANULOCYTES 0 Not Estab. %    ABS. IMM. GRANS. 0.0 0.0 - 0.1 K44W1/UU   METABOLIC PANEL, COMPREHENSIVE    Collection Time: 08/27/18  9:40 AM   Result Value Ref Range    Glucose 210 (H) 65 - 99 mg/dL    BUN 10 6 - 20 mg/dL  Creatinine 0.55 (L) 0.57 - 1.00 mg/dL    GFR est non-AA 135 >59 mL/min/1.73    GFR est AA 156 >59 mL/min/1.73    BUN/Creatinine ratio 18 9 - 23    Sodium 138 134 - 144 mmol/L    Potassium 4.3 3.5 - 5.2 mmol/L    Chloride 101 96 - 106 mmol/L     CO2 20 20 - 29 mmol/L    Calcium 9.4 8.7 - 10.2 mg/dL    Protein, total 6.7 6.0 - 8.5 g/dL    Albumin 4.1 3.5 - 5.5 g/dL    GLOBULIN, TOTAL 2.6 1.5 - 4.5 g/dL    A-G Ratio 1.6 1.2 - 2.2    Bilirubin, total 0.5 0.0 - 1.2 mg/dL    Alk. phosphatase 65 39 - 117 IU/L    AST (SGOT) 16 0 - 40 IU/L    ALT (SGPT) 23 0 - 32 IU/L   HEMOGLOBIN A1C WITH EAG    Collection Time: 08/27/18  9:40 AM   Result Value Ref Range    Hemoglobin A1c 8.4 (H) 4.8 - 5.6 %    Estimated average glucose 194 mg/dL   HCG QL SERUM    Collection Time: 08/27/18  9:40 AM   Result Value Ref Range    hCG,Beta Subunit,Ql. Negative Negative <6 mIU/mL   EKG, 12 LEAD, INITIAL    Collection Time: 08/27/18  1:15 PM   Result Value Ref Range    Ventricular Rate 83 BPM    Atrial Rate 83 BPM    P-R Interval 146 ms    QRS Duration 82 ms    Q-T Interval 358 ms    QTC Calculation (Bezet) 420 ms    Calculated P Axis 20 degrees    Calculated R Axis 34 degrees    Calculated T Axis 30 degrees    Diagnosis       Normal sinus rhythm  Normal ECG  Confirmed by Berton Lan MD, Tamera Reason ([id number]) on 08/28/2018 8:11:27 AM     GLUCOSE, POC    Collection Time: 09/01/18  7:57 AM   Result Value Ref Range    Glucose (POC) 202 (H) 70 - 110 mg/dL   HCG URINE, QL. - POC    Collection Time: 09/01/18  8:00 AM   Result Value Ref Range    Pregnancy test,urine (POC) NEGATIVE  NEG     GLUCOSE, POC    Collection Time: 09/01/18  9:23 AM   Result Value Ref Range    Glucose (POC) 132 (H) 70 - 110 mg/dL   GLUCOSE, POC    Collection Time: 09/01/18 12:20 PM   Result Value Ref Range    Glucose (POC) 197 (H) 70 - 110 mg/dL   GLUCOSE, POC    Collection Time: 09/01/18  6:14 PM   Result Value Ref Range    Glucose (POC) 198 (H) 70 - 110 mg/dL   GLUCOSE, POC    Collection Time: 09/02/18 12:13 AM   Result Value Ref Range    Glucose (POC) 124 (H) 70 - 110 mg/dL   CBC WITH AUTOMATED DIFF    Collection Time: 09/02/18  4:55 AM   Result Value Ref Range    WBC 14.8 (H) 4.6 - 13.2 K/uL    RBC 4.23 4.20 - 5.30 M/uL     HGB 12.3 12.0 - 16.0 g/dL    HCT 36.6 35.0 - 45.0 %    MCV 86.5 74.0 - 97.0 FL    MCH 29.1 24.0 -  34.0 PG    MCHC 33.6 31.0 - 37.0 g/dL    RDW 14.9 (H) 11.6 - 14.5 %    PLATELET 278 135 - 420 K/uL    MPV 9.7 9.2 - 11.8 FL    NEUTROPHILS 67 40 - 73 %    LYMPHOCYTES 25 21 - 52 %    MONOCYTES 7 3 - 10 %    EOSINOPHILS 1 0 - 5 %    BASOPHILS 0 0 - 2 %    ABS. NEUTROPHILS 10.0 (H) 1.8 - 8.0 K/UL    ABS. LYMPHOCYTES 3.7 (H) 0.9 - 3.6 K/UL    ABS. MONOCYTES 1.0 0.05 - 1.2 K/UL    ABS. EOSINOPHILS 0.1 0.0 - 0.4 K/UL    ABS. BASOPHILS 0.0 0.0 - 0.1 K/UL    DF AUTOMATED     METABOLIC PANEL, COMPREHENSIVE    Collection Time: 09/02/18  4:55 AM   Result Value Ref Range    Sodium 139 136 - 145 mmol/L    Potassium 3.5 3.5 - 5.5 mmol/L    Chloride 106 100 - 111 mmol/L    CO2 28 21 - 32 mmol/L    Anion gap 5 3.0 - 18 mmol/L    Glucose 101 (H) 74 - 99 mg/dL    BUN 5 (L) 7.0 - 18 MG/DL    Creatinine 0.38 (L) 0.6 - 1.3 MG/DL    BUN/Creatinine ratio 13 12 - 20      GFR est AA >60 >60 ml/min/1.37m    GFR est non-AA >60 >60 ml/min/1.749m   Calcium 8.3 (L) 8.5 - 10.1 MG/DL    Bilirubin, total 0.7 0.2 - 1.0 MG/DL    ALT (SGPT) 68 (H) 13 - 56 U/L    AST (SGOT) 46 (H) 10 - 38 U/L    Alk. phosphatase 53 45 - 117 U/L    Protein, total 5.8 (L) 6.4 - 8.2 g/dL    Albumin 2.9 (L) 3.4 - 5.0 g/dL    Globulin 2.9 2.0 - 4.0 g/dL    A-G Ratio 1.0 0.8 - 1.7     GLUCOSE, POC    Collection Time: 09/02/18  5:13 AM   Result Value Ref Range    Glucose (POC) 113 (H) 70 - 110 mg/dL   GLUCOSE, POC    Collection Time: 09/02/18 12:44 PM   Result Value Ref Range    Glucose (POC) 136 (H) 70 - 110 mg/dL       Assessment:   History of Morbid obesity, status post laparoscopic sleeve gastrectomy.  Doing well postoperatively.  Exercise a minimum of 30 minutes daily.  HTN - Continue meds and follow up with PCP or cardiology  DM - continue to monitor blood glucose levels and f/u with endocrinology    Plan:     1. Increase activity to the goal of 30 minutes daily    2. Start 50062mrsMorgan [employer]day, stop after 6 months out from surgery.   3. Pt is cleared to return to work without restrictions.   4. Can stop probiotic    5. Sleep goal is 7-9 hours a night.  Patient eduction given on the effects of sleep deprivation on weight control.  6. Advance diet to solid phase. Reminded to measure portions, continue high protein, low carbohydrate diet.  Reminded to eat regularly, to eat slowly & not to drink with meals.  Refer to the handbook given in class.  7. Patient has appt with dietician directly after this  visit.  8. Continue multivitamin and add the additional vitamin supplementation (Ca, B complex, B12, D, all listed in handbook)  9. Continue current medications and follow up with PCP for management of regimen.   10. Continue cardio exercise and add resistance exercises.  Discussed with patient. Minimum of 30 minutes of exercise daily.  11. Encouraged to attend support group   12. I have discussed this plan with patient and they verbalized understanding  13. Follow up in 1 months or sooner if patient has questions, concerns or worsening of condition, if unable to reach our office, patient should report to the ED.    16. Alexandria Vasquez has a reminder for a "due or due soon" health maintenance. I have asked that she contact her primary care provider for a follow-up on this health maintenance.

## 2018-09-30 NOTE — Progress Notes (Signed)
Alexandria Vasquez presents today for   Chief Complaint   Patient presents with   ??? Follow-up     Pt is here today for her follow up       Is someone accompanying this pt? no    Is the patient using any DME equipment during OV? no    Depression Screening:  3 most recent PHQ Screens 09/30/2018   Little interest or pleasure in doing things Not at all   Feeling down, depressed, irritable, or hopeless Not at all   Total Score PHQ 2 0       Learning Assessment:  Learning Assessment 07/15/2018   PRIMARY LEARNER Patient   HIGHEST LEVEL OF EDUCATION - PRIMARY LEARNER  GRADUATED HIGH SCHOOL OR GED   BARRIERS PRIMARY LEARNER NONE   CO-LEARNER CAREGIVER No   PRIMARY LANGUAGE ENGLISH   INTERPRETER NEED No   LEARNER PREFERENCE PRIMARY DEMONSTRATION   LEARNING SPECIAL TOPICS no   ANSWERED BY patient   RELATIONSHIP SELF       Abuse Screening:  Abuse Screening Questionnaire 09/30/2018   Do you ever feel afraid of your partner? N   Are you in a relationship with someone who physically or mentally threatens you? N   Is it safe for you to go home? Y       Fall Risk  No flowsheet data found.      Coordination of Care:  1. Have you been to the ER, urgent care clinic since your last visit? Hospitalized since your last visit? no    2. Have you seen or consulted any other health care providers outside of the [facility] since your last visit? Include any pap smears or colon screening. no

## 2018-09-30 NOTE — Progress Notes (Signed)
Pt given one on one diet education. Reviewed intake guidelines for month 1 -6.  We reviewed diet progression guide with portions. Appears to have a good understanding of the diet progression, food choices, and dietary/exercise habits for successful weight loss and nourishment one month after surgery. The  material included: post-op diet progression and portion sizes (including low fat, low sugar food recommendations and emphasis on protein foods and protein supplements), good beverage choices, reading a food label, vitamins/minerals required after weight loss surgery, and encouraging dietary and exercise habits that lead to weight loss success.   Pt also received a restaurant card, which tells restaurants that the patient had a procedure that decreases the size of their stomach so the restaurant may let them order off the children's menu, the senior's menu, or a smaller portion for a reduced rate.  Provided contact information for any further questions or concerns.     Kirby E. Moir, RD

## 2018-09-30 NOTE — Progress Notes (Signed)
Alexandria Vasquez presents today for   Chief Complaint   Patient presents with   . Follow-up     Pt is here today for her follow up       Is someone accompanying this pt? no    Is the patient using any DME equipment during OV? no    Depression Screening:  3 most recent PHQ Screens 09/30/2018   Little interest or pleasure in doing things Not at all   Feeling down, depressed, irritable, or hopeless Not at all   Total Score PHQ 2 0       Learning Assessment:  Learning Assessment 07/15/2018   PRIMARY LEARNER Patient   HIGHEST LEVEL OF EDUCATION - PRIMARY LEARNER  GRADUATED HIGH SCHOOL OR GED   BARRIERS PRIMARY LEARNER NONE   CO-LEARNER CAREGIVER No   PRIMARY LANGUAGE ENGLISH   INTERPRETER NEED No   LEARNER PREFERENCE PRIMARY DEMONSTRATION   LEARNING SPECIAL TOPICS no   ANSWERED BY patient   RELATIONSHIP SELF       Abuse Screening:  Abuse Screening Questionnaire 09/30/2018   Do you ever feel afraid of your partner? N   Are you in a relationship with someone who physically or mentally threatens you? N   Is it safe for you to go home? Y       Fall Risk  No flowsheet data found.      Coordination of Care:  1. Have you been to the ER, urgent care clinic since your last visit? Hospitalized since your last visit? no    2. Have you seen or consulted any other health care providers outside of the [facility] since your last visit? Include any pap smears or colon screening. no

## 2018-09-30 NOTE — Progress Notes (Signed)
Subjective:      Alexandria Vasquez is a 23 y.o. 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 Doing well overall.  She has lost a total of 20 pounds since surgery.  Body mass index is 36.02 kg/m??.  Has lost 18% of EBW.    Currently on a soft food diet without difficulty, reports no issues and denies vomiting and abdominal pain. Taking in 90+oz water daily.  Sources of protein include protein shakes and chicken.    15 min of activity 2 days a week, including walking.  Patient is sleeping 7-8 hours a night on average.  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     Bowel movements are regular. The patient is not having any pain.. The patient is compliant with multivitamins.     Weight Loss Metrics 09/30/2018 09/15/2018 09/01/2018 08/21/2018 08/10/2018 07/15/2018 07/15/2018   Today's Wt 230 lb 232 lb 261 lb 8 oz 250 lb 249 lb 9.6 oz 249 lb 249 lb   BMI 36.02 kg/m2 36.34 kg/m2 40.96 kg/m2 39.16 kg/m2 39.09 kg/m2 39 kg/m2 39 kg/m2          Comorbidities:    Hypertension: improved, on meds, denies lightheadedness or dizziness  Diabetes: improved, off meds, blood glucose levels range 80's to 90's  Obstructive Sleep Apnea: not applicable  Hyperlipidemia: not applicable  Stress Urinary Incontinence: not applicable  Gastroesophageal Reflux: not applicable  Weight related arthropathy:not applicable     Patient Active Problem List   Diagnosis Code   ??? Obesity, morbid ([place]) E66.01   ??? Hypertension I10   ??? Diabetes ([place]) E11.9   ??? 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 Severe obesity (BMI 35.0-39.9) with comorbidity ([place]) E66.01   ??? Uses birth control Z78.9   ??? Smoking history Z87.891   ??? 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 Steatohepatitis K75.81        Past Medical History:   Diagnosis Date   ??? Diabetes ([place])     Dx at age 34 - 2 meds as of fall 2019   ??? Hypertension    ??? Intestinal malabsorption 09/11/2018   ??? S/P laparoscopic sleeve gastrectomy  09/11/2018    09/01/2018 by Dr. Pleas Patricia   ??? Severe obesity (BMI 35.0-39.9) with comorbidity ([place])    ??? Sleep disorder breathing    ??? Smoking history     quit 2018   ??? Steatohepatitis 09/11/2018   ??? Uses birth control     implant       Past Surgical History:   Procedure Laterality Date   ??? HX TONSILLECTOMY  2019   ??? HX WISDOM TEETH EXTRACTION         Current Outpatient Medications   Medication Sig Dispense Refill   ??? multivitamin with iron (FLINTSTONES) chewable tablet Take 1 Tab by mouth daily.     ??? omeprazole (PRILOSEC) 20 mg capsule Take 1 Cap by mouth daily. 30 Cap 3   ??? loratadine (CLARITIN) 10 mg tablet Take 10 mg by mouth daily.     ??? losartan (COZAAR) 25 mg tablet Take 25 mg by mouth daily.     ??? etonogestrel (NEXPLANON) 68 mg impl by SubDERmal route.     ??? ursodiol (URSO FORTE) 500 mg tablet Take 1 Tab by mouth daily for 30 days. 30 Tab 4       No Known Allergies    Review of Systems:  General - No history or complaints of unexpected fever or chills  Head/Neck - No history or complaints of headache or dizziness  Cardiac - No history or complaints of chest pain, palpitations, or shortness of breath  Pulmonary - No history or complaints of shortness of breath or productive cough  Gastrointestinal - as noted above  Genitourinary - No history or complaints of hematuria/dysuria or renal lithiasis  Musculoskeletal - No history or complaints of joint  muscular weakness  Hematologic - No history of any bleeding episodes  Neurologic - No history or complaints of  migraine headaches or neurologic symptoms    Objective:     Visit Vitals  BP 116/62 (BP 1 Location: Left arm, BP Patient Position: Sitting)   Pulse 81   Temp 98.4 ??F (36.9 ??C)   Ht '5\' 7"'  (1.702 m)   Wt 104.3 kg (230 lb)   SpO2 100%   BMI 36.02 kg/m??       General:  alert, cooperative, no distress, appears stated age   Chest: lungs clear to auscultation, breath sounds equal and symmetric, no rhonchi, rales or wheezes, no accessory muscle use   Cor:   Regular  rate and rhythm or without murmur or extra heart sounds   Abdomen: soft, bowel sounds active, non-tender, no masses or organomegaly   Incisions:   healing well, no drainage, no erythema, no hernia, no seroma, no swelling, no dehiscence, incision well approximated       Recent Results (from the past 2016 hour(s))   [facility] SPECIMEN COL    Collection Time: 08/27/18  9:37 AM   Result Value Ref Range    [facility] SPECIMEN COLLN.        Specimens collected/sent to [facility]. Please direct inquiries to [phone]).   TYPE & SCREEN    Collection Time: 08/27/18  9:38 AM   Result Value Ref Range    Crossmatch Expiration 09/04/2018     ABO/Rh(D) Jenetta Downer POSITIVE     Antibody screen NEG    CBC WITH AUTOMATED DIFF    Collection Time: 08/27/18  9:40 AM   Result Value Ref Range    WBC 16.4 (H) 3.4 - 10.8 x10E3/uL    RBC 4.66 3.77 - 5.28 x10E6/uL    HGB 13.9 11.1 - 15.9 g/dL    HCT 39.5 34.0 - 46.6 %    MCV 85 79 - 97 fL    MCH 29.8 26.6 - 33.0 pg    MCHC 35.2 31.5 - 35.7 g/dL    RDW 14.2 12.3 - 15.4 %    PLATELET 336 150 - 450 x10E3/uL    NEUTROPHILS 70 Not Estab. %    Lymphocytes 23 Not Estab. %    MONOCYTES 5 Not Estab. %    EOSINOPHILS 1 Not Estab. %    BASOPHILS 1 Not Estab. %    ABS. NEUTROPHILS 11.5 (H) 1.4 - 7.0 x10E3/uL    Abs Lymphocytes 3.8 (H) 0.7 - 3.1 x10E3/uL    ABS. MONOCYTES 0.8 0.1 - 0.9 x10E3/uL    ABS. EOSINOPHILS 0.2 0.0 - 0.4 x10E3/uL    ABS. BASOPHILS 0.1 0.0 - 0.2 x10E3/uL    IMMATURE GRANULOCYTES 0 Not Estab. %    ABS. IMM. GRANS. 0.0 0.0 - 0.1 [id number]   METABOLIC PANEL, COMPREHENSIVE    Collection Time: 08/27/18  9:40 AM   Result Value Ref Range    Glucose 210 (H) 65 - 99 mg/dL    BUN 10 6 - 20 mg/dL  Creatinine 0.55 (L) 0.57 - 1.00 mg/dL    GFR est non-AA 135 >59 mL/min/1.73    GFR est AA 156 >59 mL/min/1.73    BUN/Creatinine ratio 18 9 - 23    Sodium 138 134 - 144 mmol/L    Potassium 4.3 3.5 - 5.2 mmol/L    Chloride 101 96 - 106 mmol/L    CO2 20 20 - 29 mmol/L    Calcium 9.4 8.7 - 10.2 mg/dL    Protein,  total 6.7 6.0 - 8.5 g/dL    Albumin 4.1 3.5 - 5.5 g/dL    GLOBULIN, TOTAL 2.6 1.5 - 4.5 g/dL    A-G Ratio 1.6 1.2 - 2.2    Bilirubin, total 0.5 0.0 - 1.2 mg/dL    Alk. phosphatase 65 39 - 117 IU/L    AST (SGOT) 16 0 - 40 IU/L    ALT (SGPT) 23 0 - 32 IU/L   HEMOGLOBIN A1C WITH EAG    Collection Time: 08/27/18  9:40 AM   Result Value Ref Range    Hemoglobin A1c 8.4 (H) 4.8 - 5.6 %    Estimated average glucose 194 mg/dL   HCG QL SERUM    Collection Time: 08/27/18  9:40 AM   Result Value Ref Range    hCG,Beta Subunit,Ql. Negative Negative <6 mIU/mL   EKG, 12 LEAD, INITIAL    Collection Time: 08/27/18  1:15 PM   Result Value Ref Range    Ventricular Rate 83 BPM    Atrial Rate 83 BPM    P-R Interval 146 ms    QRS Duration 82 ms    Q-T Interval 358 ms    QTC Calculation (Bezet) 420 ms    Calculated P Axis 20 degrees    Calculated R Axis 34 degrees    Calculated T Axis 30 degrees    Diagnosis       Normal sinus rhythm  Normal ECG  Confirmed by Berton Lan MD, Tamera Reason ([id number]) on 08/28/2018 8:11:27 AM     GLUCOSE, POC    Collection Time: 09/01/18  7:57 AM   Result Value Ref Range    Glucose (POC) 202 (H) 70 - 110 mg/dL   HCG URINE, QL. - POC    Collection Time: 09/01/18  8:00 AM   Result Value Ref Range    Pregnancy test,urine (POC) NEGATIVE  NEG     GLUCOSE, POC    Collection Time: 09/01/18  9:23 AM   Result Value Ref Range    Glucose (POC) 132 (H) 70 - 110 mg/dL   GLUCOSE, POC    Collection Time: 09/01/18 12:20 PM   Result Value Ref Range    Glucose (POC) 197 (H) 70 - 110 mg/dL   GLUCOSE, POC    Collection Time: 09/01/18  6:14 PM   Result Value Ref Range    Glucose (POC) 198 (H) 70 - 110 mg/dL   GLUCOSE, POC    Collection Time: 09/02/18 12:13 AM   Result Value Ref Range    Glucose (POC) 124 (H) 70 - 110 mg/dL   CBC WITH AUTOMATED DIFF    Collection Time: 09/02/18  4:55 AM   Result Value Ref Range    WBC 14.8 (H) 4.6 - 13.2 K/uL    RBC 4.23 4.20 - 5.30 M/uL    HGB 12.3 12.0 - 16.0 g/dL    HCT 36.6 35.0 - 45.0 %    MCV 86.5 74.0 - 97.0  FL    MCH 29.1 24.0 -  34.0 PG    MCHC 33.6 31.0 - 37.0 g/dL    RDW 14.9 (H) 11.6 - 14.5 %    PLATELET 278 135 - 420 K/uL    MPV 9.7 9.2 - 11.8 FL    NEUTROPHILS 67 40 - 73 %    LYMPHOCYTES 25 21 - 52 %    MONOCYTES 7 3 - 10 %    EOSINOPHILS 1 0 - 5 %    BASOPHILS 0 0 - 2 %    ABS. NEUTROPHILS 10.0 (H) 1.8 - 8.0 K/UL    ABS. LYMPHOCYTES 3.7 (H) 0.9 - 3.6 K/UL    ABS. MONOCYTES 1.0 0.05 - 1.2 K/UL    ABS. EOSINOPHILS 0.1 0.0 - 0.4 K/UL    ABS. BASOPHILS 0.0 0.0 - 0.1 K/UL    DF AUTOMATED     METABOLIC PANEL, COMPREHENSIVE    Collection Time: 09/02/18  4:55 AM   Result Value Ref Range    Sodium 139 136 - 145 mmol/L    Potassium 3.5 3.5 - 5.5 mmol/L    Chloride 106 100 - 111 mmol/L    CO2 28 21 - 32 mmol/L    Anion gap 5 3.0 - 18 mmol/L    Glucose 101 (H) 74 - 99 mg/dL    BUN 5 (L) 7.0 - 18 MG/DL    Creatinine 0.38 (L) 0.6 - 1.3 MG/DL    BUN/Creatinine ratio 13 12 - 20      GFR est AA >60 >60 ml/min/1.57m    GFR est non-AA >60 >60 ml/min/1.763m   Calcium 8.3 (L) 8.5 - 10.1 MG/DL    Bilirubin, total 0.7 0.2 - 1.0 MG/DL    ALT (SGPT) 68 (H) 13 - 56 U/L    AST (SGOT) 46 (H) 10 - 38 U/L    Alk. phosphatase 53 45 - 117 U/L    Protein, total 5.8 (L) 6.4 - 8.2 g/dL    Albumin 2.9 (L) 3.4 - 5.0 g/dL    Globulin 2.9 2.0 - 4.0 g/dL    A-G Ratio 1.0 0.8 - 1.7     GLUCOSE, POC    Collection Time: 09/02/18  5:13 AM   Result Value Ref Range    Glucose (POC) 113 (H) 70 - 110 mg/dL   GLUCOSE, POC    Collection Time: 09/02/18 12:44 PM   Result Value Ref Range    Glucose (POC) 136 (H) 70 - 110 mg/dL       Assessment:   History of Morbid obesity, status post laparoscopic sleeve gastrectomy.  Doing well postoperatively.  Exercise a minimum of 30 minutes daily.  HTN - Continue meds and follow up with PCP or cardiology  DM - continue to monitor blood glucose levels and f/u with endocrinology    Plan:     1. Increase activity to the goal of 30 minutes daily   2. Start 50048mrs[employer]day, stop after 6 months out from surgery.   3. Pt is  cleared to return to work without restrictions.   4. Can stop probiotic    5. Sleep goal is 7-9 hours a night.  Patient eduction given on the effects of sleep deprivation on weight control.  6. Advance diet to solid phase. Reminded to measure portions, continue high protein, low carbohydrate diet.  Reminded to eat regularly, to eat slowly & not to drink with meals.  Refer to the handbook given in class.  7. Patient has appt with dietician directly after this  visit.  8. Continue multivitamin and add the additional vitamin supplementation (Ca, B complex, B12, D, all listed in handbook)  9. Continue current medications and follow up with PCP for management of regimen.   10. Continue cardio exercise and add resistance exercises.  Discussed with patient. Minimum of 30 minutes of exercise daily.  11. Encouraged to attend support group   12. I have discussed this plan with patient and they verbalized understanding  13. Follow up in 1 months or sooner if patient has questions, concerns or worsening of condition, if unable to reach our office, patient should report to the ED.    72. Alexandria Vasquez has a reminder for a "due or due soon" health maintenance. I have asked that she contact her primary care provider for a follow-up on this health maintenance.

## 2018-11-05 ENCOUNTER — Ambulatory Visit: Attending: Physician Assistant

## 2018-11-05 ENCOUNTER — Ambulatory Visit
Admit: 2018-11-05 | Discharge: 2018-11-05 | Payer: PRIVATE HEALTH INSURANCE | Attending: Physician Assistant | Primary: Family Medicine

## 2018-11-05 DIAGNOSIS — K909 Intestinal malabsorption, unspecified: Secondary | ICD-10-CM

## 2018-11-05 NOTE — Patient Instructions (Incomplete)
Patient Instructions      1. Remember hydration goals - minimum of 64 ounces of liquids per day (dehydration is the number one reason for hospital readmission).  2. Sleep 7-9 hours each night to keep your metabolism up.  3. Continue to monitor carbohydrate and protein intake you need a minimum of 90-100 Grams of protein daily- remember to keep your total carbohydrates to 50 grams or less per day for best results.    4. To maximize weight loss keep your caloric intake between 800-1,200 calories daily.  If you are exercising excessively, such as training for a marathon, you need to keep a food log and meet with the dietician so they can advise you on your diet choices, carbohydrate intake and caloric intake.   5. Continue to work towards exercise goals - 60-90 minutes, 5 times a week minimum of deliberate, aerobic exercise is the ultimate goal with strength training 2 times each week. Refer to [employer] flier for personal trainer information.  6. Remember to take vitamins as directed in your handbook.  7. Attend support group the 2nd Thursday of each month.  8. Constipation: Milk of Magnesia is for immediate relief only.  Miralax is to be used every day if constipation is a chronic problem.    9. Diarrhea: patients will occasionally develop lactose intolerance after surgery.  Check to see if your protein shake has whey in it.  If it does try a protein powder or drink that does not have whey and stop all yogurts, cheeses and milks to see if the diarrhea goes away.    10. If you have had labs drawn.  We will only call you if you have abnormal results.  Otherwise you can access the lab results in "mychart".  You will only need the access code the first time you sign on.    11. Call us at [phone] or email us through "[email or web address]" with questions,     concerns or worsening of condition, we have someone on call  24 hours a day.  If you are unable to reach our office, you are to go to your Primary Care Physician or the Emergency Department.     NOTE TO GASTRIC BYPASS PATIENTS:  (SAME APPLIES TO GASTRIC SLEEVE PATIENTS FOR FIRST TWO MONTHS)  Remember that for the rest of your life, you are not able to take the following:  - NSAIDs (ibuprofen, goody powder, BC powder, Motrin, Advil, Mobic, Voltaren, Excedrin, etc.)  - Steroid pills or injections  - Smoke (cigarettes or recreational drugs)  - Alcohol  Use of any of the above may cause ulcers in your stomach which may perforate causing a medical emergency and surgery. Speak to our medical staff if another medical provider requires you to take steroids or NSAIDs.        Supplement Resource Guide    Importance of Protein:   Maintains lean body mass, produces antibodies to fight off infections, heals wounds, minimizes hair loss, helps to give you energy, helps with satiety, and keeping you full between meals.    Importance of Calcium:  Needed for healthy bones and teeth, normal blood clotting, and nervous system functioning, higher risk of osteoporosis and bone disease with non-compliance.    Importance of Multivitamins:  Many functions.  Supply you with extra nutrients that you may be missing from food.  May lead to iron deficiency anemia, weakness, fatigue, and many other symptoms with non-compliance.    Importance of B Vitamins:  Important   for red blood cell formation, metabolism, energy, and helps to maintain a healthy nervous system.    Protein Supplement  Liquid diet phase: consume 90-100g protein daily.    Once you are eating consume  35-50g protein each day from your protein supplement.     0-3 g fat per serving  0-3 g sugar per serving    The body can only absorb 30g of protein at one time, so do not consume more than that at one time.   Multi-vitamin Supplement:    Start immediately after surgery: any complete chewable, such as: Flintstone???s Complete chewables.     Avoid Flintstone sours or gummies.  They lack iron and other important nutrients and also have added sugar.    Continue with a chewable vitamin or change to an adult complete multivitamin one month after surgery. Menstruating women can take a prenatal vitamin.    Make sure it has at least 18 mg iron and 400-800 mcg folic acid Calcium Supplement:     Start taking within one month after surgery.   Look for:   Calcium Citrate Plus D (1500 mg per day)    Recommend: Citracal    Avoid chocolate chewable calcium.     Can use chewable bariatric or [employer] brand or similar chewable.    The body cannot absorb more than 500-600 mg of calcium at one time.    Take for Life    Vitamin D  Take 3,000 international units daily Vitamin B12  B Complex Vitamin  Start taking both within one month after surgery.     Vitamin B12 (sublingual):    Take 1000 mcg of Vitamin B12 three times weekly    Must take sublingually (meaning you put it under your tongue) or in a liquid drop form for easy absorption.      B Complex Vitamin:   Take one pill daily or liquid drop form daily; as directed on bottle.     Take for Life

## 2018-11-05 NOTE — Progress Notes (Signed)
Subjective:      Alexandria Vasquez is a 23 y.o. female is now 2 months status post laparoscopic sleeve gastrectomy. Doing well overall.  She has lost a total of 33 pounds since surgery.  Body mass index is 33.99 kg/m??.  Has lost 30% of EBW.    Currently on a soft food diet without difficulty, reports no issues and denies vomiting and abdominal pain.  Daily fluid intake is good.  Protein intake is good.    Activity level is good.  Patient is not sleeping through the night.  Sleeping 5-6 hours a night.  She does currently smell like smoke.  She states that she does not smoke, but that her friends smoke around her.      Bowel movements are regular. The patient is not having any pain.. The patient is compliant with required vitamins.     Weight Loss Metrics 11/05/2018 09/30/2018 09/15/2018 09/01/2018 08/21/2018 08/10/2018 07/15/2018   Today's Wt 217 lb 230 lb 232 lb 261 lb 8 oz 250 lb 249 lb 9.6 oz 249 lb   BMI 33.99 kg/m2 36.02 kg/m2 36.34 kg/m2 40.96 kg/m2 39.16 kg/m2 39.09 kg/m2 39 kg/m2          Comorbidities:  Hypertension:??improved, on meds, denies lightheadedness or dizziness  Diabetes:??improved, off meds, blood glucose levels range 80's to 90's  Obstructive Sleep Apnea:??not applicable  Hyperlipidemia:??not applicable  Stress Urinary Incontinence:??not applicable  Gastroesophageal Reflux:??not applicable  Weight related arthropathy:not applicable     Patient Active Problem List   Diagnosis Code   ??? Obesity, morbid ([facility]) E66.01   ??? Hypertension I10   ??? Diabetes ([facility]) E11.9   ??? 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 Severe obesity (BMI 35.0-39.9) with comorbidity ([facility]) E66.01   ??? Uses birth control Z78.9   ??? Smoking history Z87.891   ??? 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 Steatohepatitis K75.81        Past Medical History:   Diagnosis Date   ??? Diabetes ([facility])     Dx at age 77 - 2 meds as of fall 2019   ??? Hypertension    ??? Intestinal malabsorption 09/11/2018    ??? 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 Shanon Brow   ??? Severe obesity (BMI 35.0-39.9) with comorbidity ([facility])    ??? Sleep disorder breathing    ??? Smoking history     quit 2018   ??? Steatohepatitis 09/11/2018   ??? Uses birth control     implant       Past Surgical History:   Procedure Laterality Date   ??? HX TONSILLECTOMY  2019   ??? HX WISDOM TEETH EXTRACTION         Current Outpatient Medications   Medication Sig Dispense Refill   ??? multivitamin with iron (FLINTSTONES) chewable tablet Take 1 Tab by mouth daily.     ??? omeprazole (PRILOSEC) 20 mg capsule Take 1 Cap by mouth daily. 30 Cap 3   ??? loratadine (CLARITIN) 10 mg tablet Take 10 mg by mouth daily.     ??? losartan (COZAAR) 25 mg tablet Take 25 mg by mouth daily.     ??? etonogestrel (NEXPLANON) 68 mg impl by SubDERmal route.         No Known Allergies    Review of Systems:  General - No history or complaints of unexpected fever or chills  Head/Neck - No history or complaints of headache or dizziness  Cardiac -  No history or complaints of chest pain, palpitations, or shortness of breath  Pulmonary - No history or complaints of shortness of breath or productive cough  Gastrointestinal - as noted above  Genitourinary - No history or complaints of hematuria/dysuria or renal lithiasis  Musculoskeletal - No history or complaints of joint  muscular weakness  Hematologic - No history of any bleeding episodes  Neurologic - No history or complaints of  migraine headaches or neurologic symptoms    Objective:     Visit Vitals  BP 133/81 (BP 1 Location: Left arm, BP Patient Position: Sitting)   Pulse 71   Temp 98.6 ??F (37 ??C)   Ht 5\' 7"  (1.702 m)   Wt 98.4 kg (217 lb)   SpO2 100%   BMI 33.99 kg/m??       General:  alert, cooperative, no distress, appears stated age   Chest: lungs clear to auscultation, breath sounds equal and symmetric, no rhonchi, rales or wheezes, no accessory muscle use    Cor:   Regular rate and rhythm or without murmur or extra heart sounds   Abdomen: soft, bowel sounds active, non-tender, no masses or organomegaly   Incisions:   healed well       Assessment:   History of Morbid obesity, status post laparoscopic sleeve gastrectomy.  Doing well postoperatively.  Sleep goal is 7-9 hours each night.  Patient education given on the effects of sleep deprivation on weight control.  Exercise a minimum of 30 minutes daily.  Increase fluid intake to >64oz daily or more of sugar free and caffeine free fluids.  Strict diet control, patient is to keep carbohydrate consumption <50g daily for additional weight loss.   HTN - Continue meds and follow up with PCP  DM - Continue meds and follow up with PCP    Plan:     1. Increase activity to the goal of 30 minutes daily  2. Reminded to measure portions, continue high protein, low carbohydrate diet.  Reminded to eat regularly, to eat slowly & not to drink with meals.    3. Sleep goal is 7-9 hours a night.  Patient education given on the effects of sleep deprivation on weight control.  4. Continue vitamin supplementation  5. Continue current medications and follow up with PCP for management of regimen.   6. Continue cardio exercise and resistance exercises.  60-90 min aerobic exercise 5 times a week and strength training 2 days each week.  7. Encouraged to attend support group   8. I have discussed this plan with patient and they verbalized understanding  9. Follow up in 2 months or sooner if patient has questions, concerns or worsening of condition, if unable to reach our office, patient should report to the ED.    10. Alexandria Vasquez has a reminder for a "due or due soon" health maintenance. I have asked that she contact her primary care provider for a follow-up on this health maintenance.

## 2018-11-05 NOTE — Progress Notes (Signed)
1. Have you been to the ER, urgent care clinic since your last visit?  Hospitalized since your last visit?No    2. Have you seen or consulted any other health care providers outside of the [facility] since your last visit?  Include any pap smears or colon screening. No        Chief Complaint   Patient presents with   ??? Follow-up

## 2018-11-05 NOTE — Progress Notes (Signed)
Subjective:      Alexandria Vasquez is a 23 y.o. female is now 2 months status post laparoscopic sleeve gastrectomy. Doing well overall.  She has lost a total of 33 pounds since surgery.  Body mass index is 33.99 kg/m??.  Has lost 30% of EBW.    Currently on a soft food diet without difficulty, reports no issues and denies vomiting and abdominal pain.  Daily fluid intake is good.  Protein intake is good.    Activity level is good.  Patient is not sleeping through the night.  Sleeping 5-6 hours a night.  She does currently smell like smoke.  She states that she does not smoke, but that her friends smoke around her.      Bowel movements are regular. The patient is not having any pain.. The patient is compliant with required vitamins.     Weight Loss Metrics 11/05/2018 09/30/2018 09/15/2018 09/01/2018 08/21/2018 08/10/2018 07/15/2018   Today's Wt 217 lb 230 lb 232 lb 261 lb 8 oz 250 lb 249 lb 9.6 oz 249 lb   BMI 33.99 kg/m2 36.02 kg/m2 36.34 kg/m2 40.96 kg/m2 39.16 kg/m2 39.09 kg/m2 39 kg/m2          Comorbidities:  Hypertension:??improved, on meds, denies lightheadedness or dizziness  Diabetes:??improved, off meds, blood glucose levels range 80's to 90's  Obstructive Sleep Apnea:??not applicable  Hyperlipidemia:??not applicable  Stress Urinary Incontinence:??not applicable  Gastroesophageal Reflux:??not applicable  Weight related arthropathy:not applicable     Patient Active Problem List   Diagnosis Code   ??? Obesity, morbid (HCC) E66.01   ??? Hypertension I10   ??? Diabetes (HCC) E11.9   ??? 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 Severe obesity (BMI 35.0-39.9) with comorbidity (HCC) E66.01   ??? Uses birth control Z78.9   ??? Smoking history Z87.891   ??? 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 Steatohepatitis K75.81        Past Medical History:   Diagnosis Date   ??? Diabetes (HCC)     Dx at age 41 - 2 meds as of fall 2019   ??? Hypertension    ??? Intestinal malabsorption 09/11/2018   ??? S/P laparoscopic sleeve  gastrectomy 09/11/2018    09/01/2018 by Dr. Shanon Vasquez   ??? Severe obesity (BMI 35.0-39.9) with comorbidity (HCC)    ??? Sleep disorder breathing    ??? Smoking history     quit 2018   ??? Steatohepatitis 09/11/2018   ??? Uses birth control     implant       Past Surgical History:   Procedure Laterality Date   ??? HX TONSILLECTOMY  2019   ??? HX WISDOM TEETH EXTRACTION         Current Outpatient Medications   Medication Sig Dispense Refill   ??? multivitamin with iron (FLINTSTONES) chewable tablet Take 1 Tab by mouth daily.     ??? omeprazole (PRILOSEC) 20 mg capsule Take 1 Cap by mouth daily. 30 Cap 3   ??? loratadine (CLARITIN) 10 mg tablet Take 10 mg by mouth daily.     ??? losartan (COZAAR) 25 mg tablet Take 25 mg by mouth daily.     ??? etonogestrel (NEXPLANON) 68 mg impl by SubDERmal route.         No Known Allergies    Review of Systems:  General - No history or complaints of unexpected fever or chills  Head/Neck - No history or complaints of headache or dizziness  Cardiac -  No history or complaints of chest pain, palpitations, or shortness of breath  Pulmonary - No history or complaints of shortness of breath or productive cough  Gastrointestinal - as noted above  Genitourinary - No history or complaints of hematuria/dysuria or renal lithiasis  Musculoskeletal - No history or complaints of joint  muscular weakness  Hematologic - No history of any bleeding episodes  Neurologic - No history or complaints of  migraine headaches or neurologic symptoms    Objective:     Visit Vitals  BP 133/81 (BP 1 Location: Left arm, BP Patient Position: Sitting)   Pulse 71   Temp 98.6 ??F (37 ??C)   Ht 5\' 7"  (1.702 m)   Wt 98.4 kg (217 lb)   SpO2 100%   BMI 33.99 kg/m??       General:  alert, cooperative, no distress, appears stated age   Chest: lungs clear to auscultation, breath sounds equal and symmetric, no rhonchi, rales or wheezes, no accessory muscle use   Cor:   Regular rate and rhythm or without murmur or extra heart sounds   Abdomen: soft, bowel  sounds active, non-tender, no masses or organomegaly   Incisions:   healed well       Assessment:   History of Morbid obesity, status post laparoscopic sleeve gastrectomy.  Doing well postoperatively.  Sleep goal is 7-9 hours each night.  Patient education given on the effects of sleep deprivation on weight control.  Exercise a minimum of 30 minutes daily.  Increase fluid intake to >64oz daily or more of sugar free and caffeine free fluids.  Strict diet control, patient is to keep carbohydrate consumption <50g daily for additional weight loss.   HTN - Continue meds and follow up with PCP  DM - Continue meds and follow up with PCP    Plan:     1. Increase activity to the goal of 30 minutes daily  2. Reminded to measure portions, continue high protein, low carbohydrate diet.  Reminded to eat regularly, to eat slowly & not to drink with meals.    3. Sleep goal is 7-9 hours a night.  Patient education given on the effects of sleep deprivation on weight control.  4. Continue vitamin supplementation  5. Continue current medications and follow up with PCP for management of regimen.   6. Continue cardio exercise and resistance exercises.  60-90 min aerobic exercise 5 times a week and strength training 2 days each week.  7. Encouraged to attend support group   8. I have discussed this plan with patient and they verbalized understanding  9. Follow up in 2 months or sooner if patient has questions, concerns or worsening of condition, if unable to reach our office, patient should report to the ED.    10. Alexandria Vasquez has a reminder for a "due or due soon" health maintenance. I have asked that she contact her primary care provider for a follow-up on this health maintenance.

## 2018-11-05 NOTE — Progress Notes (Signed)
 1. Have you been to the ER, urgent care clinic since your last visit?  Hospitalized since your last visit?No    2. Have you seen or consulted any other health care providers outside of the [facility] since your last visit?  Include any pap smears or colon screening. No      Chief Complaint   Patient presents with    Follow-up

## 2018-12-23 ENCOUNTER — Encounter: Attending: Gastroenterology | Primary: Family Medicine

## 2018-12-28 ENCOUNTER — Encounter: Attending: Specialist | Primary: Family Medicine

## 2019-01-05 ENCOUNTER — Encounter: Attending: Physician Assistant | Primary: Family Medicine

## 2019-03-17 ENCOUNTER — Encounter (HOSPITAL_COMMUNITY): Payer: Self-pay | Admitting: Emergency Medicine

## 2019-03-17 ENCOUNTER — Other Ambulatory Visit: Payer: Self-pay

## 2019-03-17 ENCOUNTER — Emergency Department (HOSPITAL_COMMUNITY)
Admission: EM | Admit: 2019-03-17 | Discharge: 2019-03-17 | Disposition: A | Discharge status: Home / Self Care | Payer: Medicaid - Out of State | Attending: Emergency Medicine | Admitting: Emergency Medicine

## 2019-03-17 DIAGNOSIS — R55 Syncope and collapse: Secondary | ICD-10-CM | POA: Insufficient documentation

## 2019-03-17 DIAGNOSIS — Z5321 Procedure and treatment not carried out due to patient leaving prior to being seen by health care provider: Secondary | ICD-10-CM | POA: Diagnosis not present

## 2019-03-17 LAB — CBC WITH DIFFERENTIAL/PLATELET
Abs Immature Granulocytes: 0.04 10*3/uL (ref 0.00–0.07)
Basophils Absolute: 0 10*3/uL (ref 0.0–0.1)
Basophils Relative: 0 %
Eosinophils Absolute: 0.3 10*3/uL (ref 0.0–0.5)
Eosinophils Relative: 2 %
HCT: 43.1 % (ref 36.0–46.0)
Hemoglobin: 14.4 g/dL (ref 12.0–15.0)
Immature Granulocytes: 0 %
Lymphocytes Relative: 30 %
Lymphs Abs: 3.6 10*3/uL (ref 0.7–4.0)
MCH: 30.4 pg (ref 26.0–34.0)
MCHC: 33.4 g/dL (ref 30.0–36.0)
MCV: 91.1 fL (ref 80.0–100.0)
Monocytes Absolute: 0.4 10*3/uL (ref 0.1–1.0)
Monocytes Relative: 4 %
Neutro Abs: 7.7 10*3/uL (ref 1.7–7.7)
Neutrophils Relative %: 64 %
Platelets: 296 10*3/uL (ref 150–400)
RBC: 4.73 MIL/uL (ref 3.87–5.11)
RDW: 13.7 % (ref 11.5–15.5)
WBC: 12.1 10*3/uL — ABNORMAL HIGH (ref 4.0–10.5)
nRBC: 0 % (ref 0.0–0.2)

## 2019-03-17 LAB — BASIC METABOLIC PANEL
Anion gap: 9 (ref 5–15)
BUN: 9 mg/dL (ref 6–20)
CO2: 23 mmol/L (ref 22–32)
Calcium: 9.1 mg/dL (ref 8.9–10.3)
Chloride: 106 mmol/L (ref 98–111)
Creatinine, Ser: 0.47 mg/dL (ref 0.44–1.00)
GFR calc Af Amer: >60 mL/min (ref >60)
GFR calc non Af Amer: >60 mL/min (ref >60)
Glucose, Bld: 87 mg/dL (ref 70–99)
Potassium: 3.1 mmol/L — ABNORMAL LOW (ref 3.5–5.1)
Sodium: 138 mmol/L (ref 135–145)

## 2019-03-17 NOTE — ED Triage Notes (Signed)
Patient states she felt lightheaded earlier today then had a syncopal episode when she got to work. Patient had gastric sleeve done in January, has lost over 70 pounds. Patient states she hasn't been eating like she should and has been drinking a lot of soda. Alert and oriented x 4 in Triage.

## 2019-08-04 NOTE — Progress Notes (Signed)
Per MBSAQIP requirements;  E-mail and letter sent for follow up appointment.    [facility]  [address]   [place] [address]                 [facility]  [facility]  [facility]      Dear Patient,    Your health is our main concern. It is important for your health to have follow-up lab work and to see your surgeon at 2 months, 4 months, 6 months, 9 months and annually after your weight loss surgery.          Additionally, the Department of Bariatric Surgery at our hospital is a member of the American College of Surgeons??? Bariatric National Surgical Quality Improvement Program (ACS NSQIP).   As a participant in this program, we gather information on the outcomes of our patients after surgery.  Please call the office for a follow up appointment at [phone].  If you have moved out of the area or have changed surgeons please call us and let us know the name of your doctor.    Your health and feedback are important to us.  We greatly appreciate your response.       Thank you,  [facility]  [place]

## 2019-08-04 NOTE — Progress Notes (Signed)
Progress Notes by Ancil Linsey, RN at 08/04/19 1510                Author: Ancil Linsey, RN  Service: --  Author Type: Navigator       Filed: 08/04/19 1511  Encounter Date: 08/04/2019  Status: Signed          Editor: Ancil Linsey, RN (Navigator)               Per [facility] requirements;   E-mail and letter sent for follow up appointment.      [facility]   [address] [place] [address]                        [facility]   [place] [facility]   [facility]         Dear Patient,      Your health is our main concern. It is important for your health to have follow-up lab work and to see your surgeon at 2 months, 4 months, 6 months, 9 months and annually after your weight loss surgery.           Additionally, the Department of Bariatric Surgery at our hospital is a member of the [employer] Surgeons Bariatric National Surgical Quality Improvement Program (ACS NSQIP).   As a participant in this program, we gather information  on the outcomes of our patients after surgery.   Please call the office for a follow up appointment at [phone].   If you have moved out of the area or have changed surgeons please call us and let us know the name of your doctor.      Your health and feedback are important to [place].  We greatly appreciate your response.          Thank you,   [employer] [facility] [place]

## 2019-10-12 DIAGNOSIS — Z9884 Bariatric surgery status: Secondary | ICD-10-CM | POA: Insufficient documentation

## 2020-01-12 ENCOUNTER — Telehealth: Payer: Self-pay | Admitting: Adult Health

## 2020-01-12 NOTE — Telephone Encounter (Signed)

## 2020-01-13 ENCOUNTER — Ambulatory Visit (INDEPENDENT_AMBULATORY_CARE_PROVIDER_SITE_OTHER): Payer: Medicaid - Out of State | Admitting: Adult Health

## 2020-01-13 ENCOUNTER — Other Ambulatory Visit: Payer: Self-pay

## 2020-01-13 ENCOUNTER — Encounter: Payer: Self-pay | Admitting: Adult Health

## 2020-01-13 VITALS — BP 126/81 | HR 80 | Ht 66.0 in | Wt 154.8 lb

## 2020-01-13 DIAGNOSIS — O3680X0 Pregnancy with inconclusive fetal viability, not applicable or unspecified: Secondary | ICD-10-CM

## 2020-01-13 DIAGNOSIS — Z8639 Personal history of other endocrine, nutritional and metabolic disease: Secondary | ICD-10-CM | POA: Diagnosis not present

## 2020-01-13 DIAGNOSIS — Z3A01 Less than 8 weeks gestation of pregnancy: Secondary | ICD-10-CM | POA: Insufficient documentation

## 2020-01-13 DIAGNOSIS — N926 Irregular menstruation, unspecified: Secondary | ICD-10-CM | POA: Insufficient documentation

## 2020-01-13 DIAGNOSIS — Z8659 Personal history of other mental and behavioral disorders: Secondary | ICD-10-CM | POA: Insufficient documentation

## 2020-01-13 DIAGNOSIS — Z3201 Encounter for pregnancy test, result positive: Secondary | ICD-10-CM | POA: Diagnosis not present

## 2020-01-13 LAB — POCT URINE PREGNANCY: Preg Test, Ur: POSITIVE — AB

## 2020-01-13 NOTE — Progress Notes (Signed)
  Subjective:     Patient ID: Kaitlin Klein, female   DOB: 01-13-1996, 24 y.o.   MRN: [id number]  HPI Kaitlin Klein is a 24 year old black female, single in for UPT has missed a period and had 3+HPTs, she is a new pt. She had gastric sleeve and has lost 130 lbs since January 2020, and stopped metformin. She had history of DM,PCO and depression.   Review of Systems +missed period with 3+HPTs Reviewed past medical,surgical, social and family history. Reviewed medications and allergies.     Objective:   Physical Exam BP 126/81 (BP Location: Left Arm, Patient Position: Sitting, Cuff Size: Normal)   Pulse 80   Ht 5\' 6"  (1.676 m)   Wt 154 lb 12.8 oz (70.2 kg)   LMP 12/10/2019 (Exact Date)   BMI 24.99 kg/m UPT +,about 4+6 weeks by LMP with EDD 09/15/20.Skin warm and dry. Neck: mid line trachea, normal thyroid, good ROM, no lymphadenopathy noted. Lungs: clear to ausculation bilaterally. Cardiovascular: regular rate and rhythm.Abdomen is soft and non tender. AA 1 Fall risk is low PHQ 9 score is 3, no SI.     Assessment:     1. Missed period  2. Less than [redacted] weeks gestation of pregnancy Take PNV No alcohol and no smoking, or vaping   3. Positive pregnancy test  4. Encounter to determine fetal viability of pregnancy, single or unspecified fetus Return in 3 weeks for dating 09/17/20  5. History of diabetes mellitus, type II  6. History of depression    Plan:     Review handout by [facility]

## 2020-01-19 ENCOUNTER — Telehealth: Payer: Self-pay | Admitting: Adult Health

## 2020-01-19 NOTE — Telephone Encounter (Signed)

## 2020-01-20 ENCOUNTER — Ambulatory Visit: Payer: Medicaid - Out of State | Admitting: Adult Health

## 2020-01-31 ENCOUNTER — Ambulatory Visit: Payer: Medicaid - Out of State | Admitting: Adult Health

## 2020-02-07 ENCOUNTER — Other Ambulatory Visit: Payer: Medicaid - Out of State

## 2020-02-08 ENCOUNTER — Ambulatory Visit (INDEPENDENT_AMBULATORY_CARE_PROVIDER_SITE_OTHER): Payer: Medicaid - Out of State

## 2020-02-08 ENCOUNTER — Ambulatory Visit (INDEPENDENT_AMBULATORY_CARE_PROVIDER_SITE_OTHER): Payer: Medicaid - Out of State | Admitting: Adult Health

## 2020-02-08 ENCOUNTER — Encounter: Payer: Self-pay | Admitting: Adult Health

## 2020-02-08 VITALS — BP 116/71 | HR 77 | Ht 66.0 in | Wt 163.2 lb

## 2020-02-08 DIAGNOSIS — Z3A08 8 weeks gestation of pregnancy: Secondary | ICD-10-CM

## 2020-02-08 DIAGNOSIS — Z87440 Personal history of urinary (tract) infections: Secondary | ICD-10-CM | POA: Insufficient documentation

## 2020-02-08 DIAGNOSIS — R350 Frequency of micturition: Secondary | ICD-10-CM | POA: Insufficient documentation

## 2020-02-08 DIAGNOSIS — Z331 Pregnant state, incidental: Secondary | ICD-10-CM | POA: Insufficient documentation

## 2020-02-08 DIAGNOSIS — O3680X0 Pregnancy with inconclusive fetal viability, not applicable or unspecified: Secondary | ICD-10-CM

## 2020-02-08 DIAGNOSIS — Z1389 Encounter for screening for other disorder: Secondary | ICD-10-CM | POA: Insufficient documentation

## 2020-02-08 DIAGNOSIS — O99891 Other specified diseases and conditions complicating pregnancy: Secondary | ICD-10-CM

## 2020-02-08 LAB — POCT URINALYSIS DIPSTICK OB
Blood, UA: NEGATIVE
Glucose, UA: NEGATIVE
Ketones, UA: NEGATIVE
Leukocytes, UA: NEGATIVE
Nitrite, UA: NEGATIVE
POC,PROTEIN,UA: NEGATIVE

## 2020-02-08 NOTE — Progress Notes (Signed)
[place] 8+4 wks,single IUP with YS,positive fht 176 bpm,normal ovaries,crl 20.21 mm

## 2020-02-08 NOTE — Progress Notes (Signed)
  Subjective:     Patient ID: Kaitlin Klein, female   DOB: 10-Aug-1996, 24 y.o.   MRN: [id number]  HPI Kaitlin Klein is a 24 year old black female, single G1P0 in complaining of ?UTI has urinary frequency, no burning or pain.She was seen at [facility]/25 and told had UTI, was not treated, she left early due to IV fluids running out and they would not answer her call light or give her water.  She said she took urinary Gummies and feels fine now.  She has [place] appt later today. She should be about 8+4 weeks by LMP.   Review of Systems +urinary frequency Denies any burning with urination Reviewed past medical,surgical, social and family history. Reviewed medications and allergies.     Objective:   Physical Exam BP 116/71 (BP Location: Right Arm, Patient Position: Sitting, Cuff Size: Normal)   Pulse 77   Ht 5\' 6"  (1.676 m)   Wt 163 lb 3.2 oz (74 kg)   LMP 12/10/2019 (Exact Date)   BMI 26.34 kg/m urine dipstick is negative Skin warm and dry. Neck: mid line trachea, normal thyroid, good ROM, no lymphadenopathy noted. Lungs: clear to ausculation bilaterally. Cardiovascular: regular rate and rhythm.No CVAT or bladder tenderness    Assessment:     1. Screening for genitourinary condition   2. Pregnant state, incidental Continue PNV  3. Urinary frequency UA C&S sent Push water   4. History of UTI UA C&S sent     Plan:     Next appt TBD by 12/12/2019

## 2020-02-09 LAB — URINALYSIS, ROUTINE W REFLEX MICROSCOPIC
Bilirubin, UA: NEGATIVE
Glucose, UA: NEGATIVE
Ketones, UA: NEGATIVE
Leukocytes,UA: NEGATIVE
Nitrite, UA: NEGATIVE
Protein,UA: NEGATIVE
RBC, UA: NEGATIVE
Specific Gravity, UA: 1.027 (ref 1.005–1.030)
Urobilinogen, Ur: 1 mg/dL (ref 0.2–1.0)
pH, UA: 6.5 (ref 5.0–7.5)

## 2020-02-10 ENCOUNTER — Ambulatory Visit: Payer: Medicaid - Out of State | Admitting: *Deleted

## 2020-02-10 LAB — URINE CULTURE

## 2020-02-24 DIAGNOSIS — O0990 Supervision of high risk pregnancy, unspecified, unspecified trimester: Secondary | ICD-10-CM | POA: Insufficient documentation

## 2020-03-06 ENCOUNTER — Other Ambulatory Visit: Payer: Self-pay | Admitting: Obstetrics and Gynecology

## 2020-03-06 DIAGNOSIS — Z3682 Encounter for antenatal screening for nuchal translucency: Secondary | ICD-10-CM

## 2020-03-07 ENCOUNTER — Ambulatory Visit (INDEPENDENT_AMBULATORY_CARE_PROVIDER_SITE_OTHER): Payer: Medicaid - Out of State

## 2020-03-07 ENCOUNTER — Other Ambulatory Visit: Payer: Medicaid - Out of State

## 2020-03-07 DIAGNOSIS — Z3682 Encounter for antenatal screening for nuchal translucency: Secondary | ICD-10-CM

## 2020-03-07 DIAGNOSIS — Z3A12 12 weeks gestation of pregnancy: Secondary | ICD-10-CM

## 2020-03-07 NOTE — Progress Notes (Signed)
[place] 12+4 wks,measurements c/w dates,CRL 64.09 mm,fhr 162 bpm,normal ovaries, NB present,NT 1.5 mm,anterior placenta

## 2020-03-09 ENCOUNTER — Ambulatory Visit (INDEPENDENT_AMBULATORY_CARE_PROVIDER_SITE_OTHER): Payer: Medicaid - Out of State | Admitting: Advanced Practice Midwife

## 2020-03-09 ENCOUNTER — Encounter: Payer: Self-pay | Admitting: Advanced Practice Midwife

## 2020-03-09 ENCOUNTER — Other Ambulatory Visit (HOSPITAL_COMMUNITY)
Admission: RE | Admit: 2020-03-09 | Discharge: 2020-03-09 | Disposition: A | Discharge status: Home / Self Care | Payer: Medicaid Other | Source: Ambulatory Visit | Attending: Advanced Practice Midwife | Admitting: Advanced Practice Midwife

## 2020-03-09 VITALS — BP 117/77 | HR 84 | Wt 174.0 lb

## 2020-03-09 DIAGNOSIS — Z1379 Encounter for other screening for genetic and chromosomal anomalies: Secondary | ICD-10-CM

## 2020-03-09 DIAGNOSIS — O0991 Supervision of high risk pregnancy, unspecified, first trimester: Secondary | ICD-10-CM | POA: Insufficient documentation

## 2020-03-09 DIAGNOSIS — Z8659 Personal history of other mental and behavioral disorders: Secondary | ICD-10-CM

## 2020-03-09 DIAGNOSIS — Z3A12 12 weeks gestation of pregnancy: Secondary | ICD-10-CM

## 2020-03-09 DIAGNOSIS — Z8639 Personal history of other endocrine, nutritional and metabolic disease: Secondary | ICD-10-CM

## 2020-03-09 DIAGNOSIS — Z124 Encounter for screening for malignant neoplasm of cervix: Secondary | ICD-10-CM | POA: Diagnosis not present

## 2020-03-09 DIAGNOSIS — O99341 Other mental disorders complicating pregnancy, first trimester: Secondary | ICD-10-CM

## 2020-03-09 DIAGNOSIS — Z331 Pregnant state, incidental: Secondary | ICD-10-CM

## 2020-03-09 DIAGNOSIS — Z1389 Encounter for screening for other disorder: Secondary | ICD-10-CM

## 2020-03-09 LAB — CBC/D/PLT+RPR+RH+ABO+RUB AB...
Antibody Screen: NEGATIVE
Basophils Absolute: 0.1 10*3/uL (ref 0.0–0.2)
Basos: 0 %
EOS (ABSOLUTE): 0.1 10*3/uL (ref 0.0–0.4)
Eos: 1 %
HCV Ab: <0.1 s/co ratio (ref 0.0–0.9)
HIV Screen 4th Generation wRfx: NONREACTIVE
Hematocrit: 36.7 % (ref 34.0–46.6)
Hemoglobin: 12.8 g/dL (ref 11.1–15.9)
Hepatitis B Surface Ag: NEGATIVE
Immature Grans (Abs): 0 10*3/uL (ref 0.0–0.1)
Immature Granulocytes: 0 %
Lymphocytes Absolute: 2.6 10*3/uL (ref 0.7–3.1)
Lymphs: 21 %
MCH: 31.3 pg (ref 26.6–33.0)
MCHC: 34.9 g/dL (ref 31.5–35.7)
MCV: 90 fL (ref 79–97)
Monocytes Absolute: 0.6 10*3/uL (ref 0.1–0.9)
Monocytes: 5 %
Neutrophils Absolute: 8.8 10*3/uL — ABNORMAL HIGH (ref 1.4–7.0)
Neutrophils: 73 %
Platelets: 260 10*3/uL (ref 150–450)
RBC: 4.09 x10E6/uL (ref 3.77–5.28)
RDW: 12.8 % (ref 11.7–15.4)
RPR Ser Ql: NONREACTIVE
Rh Factor: POSITIVE
Rubella Antibodies, IGG: 6.72 index (ref >0.99)
WBC: 12.2 10*3/uL — ABNORMAL HIGH (ref 3.4–10.8)

## 2020-03-09 LAB — INTEGRATED 1
Crown Rump Length: 64.1 mm
Gest. Age on Collection Date: 12.6 weeks
Maternal Age at EDD: 22.8 yr
Nuchal Translucency (NT): 1.5 mm
Number of Fetuses: 1
PAPP-A Value: 459 ng/mL
Weight: 170 [lb_av]

## 2020-03-09 LAB — HCV INTERPRETATION

## 2020-03-09 MED ORDER — ASPIRIN EC 81 MG PO TBEC: 81.0000 mg | DELAYED_RELEASE_TABLET | Freq: Every day | ORAL | 6 refills | Status: DC

## 2020-03-09 MED ORDER — BLOOD PRESSURE CUFF MISC: 1.0000 | Freq: Every day | 0 refills | Status: DC

## 2020-03-09 NOTE — Progress Notes (Signed)
INITIAL OBSTETRICAL VISIT Patient name: Kaitlin Klein MRN [id number]  Date of birth: 07-Jan-1996 Chief Complaint:   Initial Prenatal Visit  History of Present Illness:   Kaitlin Klein is a 24 y.o. G1P0  female at 66w6dby [place] 8 4 weeks with an Estimated Date of Delivery: 09/15/20 being seen today for her initial obstetrical visit. Her obstetrical history is significant for first baby.   She had a gastric sleeve and lost 130# (weight up 20 lbs since may, weight warning given) . Had hx of DM on metformin, stopped meds after losing weight. Also same for hx HTN. Today she reports depression is worsening.  Had SI years ago w/5 day inpt, recently had those feelings again,  Promises that she will seek emergencycare if she plans to commit suicide.  .  [facility] 03/09/2020 01/13/2020  Decreased Interest 2 0  Down, Depressed, Hopeless 3 2  PHQ - 2 Score 5 2  Altered sleeping 2 0  Tired, decreased energy 2 1  Change in appetite 3 0  Feeling bad or failure about yourself  2 0  Trouble concentrating 0 0  Moving slowly or fidgety/restless 0 0  Suicidal thoughts 1 0  PHQ-9 Score 15 3  Difficult doing work/chores Very difficult -    Patient's last menstrual period was 12/10/2019 (exact date). Last pap never. Review of Systems:   Pertinent items are noted in HPI Denies cramping/contractions, leakage of fluid, vaginal bleeding, abnormal vaginal discharge w/ itching/odor/irritation, headaches, visual changes, shortness of breath, chest pain, abdominal pain, severe nausea/vomiting, or problems with urination or bowel movements unless otherwise stated above.  Pertinent History Reviewed:  Reviewed past medical,surgical, social, obstetrical and family history.  Reviewed problem list, medications and allergies. OB History  Gravida Para Term Preterm AB Living  1         0  SAB TAB Ectopic Multiple Live Births               # Outcome Date GA Lbr Len/2nd Weight Sex Delivery Anes PTL  Lv  1 Current            Physical Assessment:   Vitals:   03/09/20 1125  BP: 117/77  Pulse: 84  Weight: 174 lb (78.9 kg)  Body mass index is 28.08 kg/m.       Physical Examination:  General appearance - well appearing, and in no distress  Mental status - alert, oriented to person, place, and time  Psych:  She has a normal mood and affect  Skin - warm and dry, normal color, no suspicious lesions noted  Chest - effort normal  Heart - normal rate and regular rhythm  Abdomen - soft, nontender  Extremities:  No swelling or varicosities noted  Pelvic - VULVA: normal appearing vulva with no masses, tenderness or lesions  VAGINA: normal appearing vagina with normal color and discharge, no lesions  CERVIX: normal appearing cervix without discharge or lesions, no CMT  Thin prep pap is done wiht HR HPV cotesting   No results found for this or any previous visit (from the past 24 hour(s)).  Assessment & Plan:  1) Low-Risk Pregnancy G1P0 at 144w6dith an Estimated Date of Delivery: 09/15/20   2) Initial OB visit  3) Hx DM--taken off meds after losing 130#.  Check Hgb A1c, ASA 8180m4)  Depression:  Therapy appointment offered and accepted  Referred to Jamie  Meds:  Meds ordered this encounter  Medications  .  Blood Pressure Monitoring (BLOOD PRESSURE CUFF) MISC    Sig: 1 kit by Does not apply route daily.    Dispense:  1 each    Refill:  0  . aspirin EC 81 MG tablet    Sig: Take 1 tablet (81 mg total) by mouth daily.    Dispense:  30 tablet    Refill:  6    Order Specific Question:   Supervising Provider    Answer:   Tania Ade H [[id number]]    Initial labs obtained. Check A1C ASA 81 mg:  AA/hx DM--could make a point not to start if A1C normal, but would probably recommend Continue prenatal vitamins Reviewed n/v relief measures and warning s/s to report Reviewed recommended weight gain based on pre-gravid BMI Encouraged well-balanced diet Genetic & carrier screening discussed:  requests Panorama, NT/IT and Horizon 14 ,  Ultrasound discussed; fetal survey: requested [place] completed> form faxed if has or is planning to apply for medicaid The nature of [place] for [employer] with multiple MDs and other Advanced Practice Providers was explained to patient; also emphasized that fellows, residents, and students are part of our team. ordered home bp cuff.. Check bp weekly, let us know if >140/90.        Joaquim Lai Cresenzo-Dishmon 12:11 PM

## 2020-03-09 NOTE — Patient Instructions (Signed)
Allyne Gee, I greatly value your feedback.  If you receive a survey following your visit with [place] today, we appreciate you taking the time to fill it out.  Thanks, Cathie Beams, DNP, CNM  [facility] HAS MOVED!!! It is now [facility] ([address] [place], [place] [address]) Entrance located off of [address] 24/7 valet parking   Nausea & Vomiting  Have saltine crackers or pretzels by your bed and eat a few bites before you raise your head out of bed in the morning  Eat small frequent meals throughout the day instead of large meals  Drink plenty of fluids throughout the day to stay hydrated, just don't drink a lot of fluids with your meals.  This can make your stomach fill up faster making you feel sick  Do not brush your teeth right after you eat  Products with real ginger are good for nausea, like ginger ale and ginger hard candy Make sure it says made with real ginger!  Sucking on sour candy like lemon heads is also good for nausea  If your prenatal vitamins make you nauseated, take them at night so you will sleep through the nausea  Sea Bands  If you feel like you need medicine for the nausea & vomiting please let us know  If you are unable to keep any fluids or food down please let us know   Constipation  Drink plenty of fluid, preferably water, throughout the day  Eat foods high in fiber such as fruits, vegetables, and grains  Exercise, such as walking, is a good way to keep your bowels regular  Drink warm fluids, especially warm prune juice, or decaf coffee  Eat a 1/2 cup of real oatmeal (not instant), 1/2 cup applesauce, and 1/2-1 cup warm prune juice every day  If needed, you may take Colace (docusate sodium) stool softener once or twice a day to help keep the stool soft.   If you still are having problems with constipation, you may take Miralax once daily as needed to help keep your bowels regular.    Home Blood Pressure Monitoring for Patients   Your provider has recommended that you check your blood pressure (BP) at least once a week at home. If you do not have a blood pressure cuff at home, one will be provided for you. Contact your provider if you have not received your monitor within 1 week.   Helpful Tips for Accurate Home Blood Pressure Checks   Don't smoke, exercise, or drink caffeine 30 minutes before checking your BP  Use the restroom before checking your BP (a full bladder can raise your pressure)  Relax in a comfortable upright chair  Feet on the ground  Left arm resting comfortably on a flat surface at the level of your heart  Legs uncrossed  Back supported  Sit quietly and don't talk  Place the cuff on your bare arm  Adjust snuggly, so that only two fingertips can fit between your skin and the top of the cuff  Check 2 readings separated by at least one minute  Keep a log of your BP readings  For a visual, please reference this diagram: http://ccnc.care/bpdiagram  Provider Name: [facility]     Phone: [phone]  Zone 1: ALL CLEAR  Continue to monitor your symptoms:   BP reading is less than 140 (top number) or less than 90 (bottom number)   No right upper stomach  pain  No headaches or seeing spots  No feeling nauseated or throwing up  No swelling in face and hands  Zone 2: CAUTION Call your doctor's office for any of the following:   BP reading is greater than 140 (top number) or greater than 90 (bottom number)   Stomach pain under your ribs in the middle or right side  Headaches or seeing spots  Feeling nauseated or throwing up  Swelling in face and hands  Zone 3: EMERGENCY  Seek immediate medical care if you have any of the following:   BP reading is greater than160 (top number) or greater than 110 (bottom number)  Severe headaches not improving with Tylenol  Serious difficulty catching your breath  Any worsening  symptoms from Zone 2    First Trimester of Pregnancy The first trimester of pregnancy is from week 1 until the end of week 12 (months 1 through 3). A week after a sperm fertilizes an egg, the egg will implant on the wall of the uterus. This embryo will begin to develop into a baby. Genes from you and your partner are forming the baby. The female genes determine whether the baby is a boy or a girl. At 6-8 weeks, the eyes and face are formed, and the heartbeat can be seen on ultrasound. At the end of 12 weeks, all the baby's organs are formed.  Now that you are pregnant, you will want to do everything you can to have a healthy baby. Two of the most important things are to get good prenatal care and to follow your health care provider's instructions. Prenatal care is all the medical care you receive before the baby's birth. This care will help prevent, find, and treat any problems during the pregnancy and childbirth. BODY CHANGES Your body goes through many changes during pregnancy. The changes vary from woman to woman.   You may gain or lose a couple of pounds at first.  You may feel sick to your stomach (nauseous) and throw up (vomit). If the vomiting is uncontrollable, call your health care provider.  You may tire easily.  You may develop headaches that can be relieved by medicines approved by your health care provider.  You may urinate more often. Painful urination may mean you have a bladder infection.  You may develop heartburn as a result of your pregnancy.  You may develop constipation because certain hormones are causing the muscles that push waste through your intestines to slow down.  You may develop hemorrhoids or swollen, bulging veins (varicose veins).  Your breasts may begin to grow larger and become tender. Your nipples may stick out more, and the tissue that surrounds them (areola) may become darker.  Your gums may bleed and may be sensitive to brushing and flossing.  Dark  spots or blotches (chloasma, mask of pregnancy) may develop on your face. This will likely fade after the baby is born.  Your menstrual periods will stop.  You may have a loss of appetite.  You may develop cravings for certain kinds of food.  You may have changes in your emotions from day to day, such as being excited to be pregnant or being concerned that something may go wrong with the pregnancy and baby.  You may have more vivid and strange dreams.  You may have changes in your hair. These can include thickening of your hair, rapid growth, and changes in texture. Some women also have hair loss during or after pregnancy, or hair that  feels dry or thin. Your hair will most likely return to normal after your baby is born. WHAT TO EXPECT AT YOUR PRENATAL VISITS During a routine prenatal visit:  You will be weighed to make sure you and the baby are growing normally.  Your blood pressure will be taken.  Your abdomen will be measured to track your baby's growth.  The fetal heartbeat will be listened to starting around week 10 or 12 of your pregnancy.  Test results from any previous visits will be discussed. Your health care provider may ask you:  How you are feeling.  If you are feeling the baby move.  If you have had any abnormal symptoms, such as leaking fluid, bleeding, severe headaches, or abdominal cramping.  If you have any questions. Other tests that may be performed during your first trimester include:  Blood tests to find your blood type and to check for the presence of any previous infections. They will also be used to check for low iron levels (anemia) and Rh antibodies. Later in the pregnancy, blood tests for diabetes will be done along with other tests if problems develop.  Urine tests to check for infections, diabetes, or protein in the urine.  An ultrasound to confirm the proper growth and development of the baby.  An amniocentesis to check for possible genetic  problems.  Fetal screens for spina bifida and Down syndrome.  You may need other tests to make sure you and the baby are doing well. HOME CARE INSTRUCTIONS  Medicines  Follow your health care provider's instructions regarding medicine use. Specific medicines may be either safe or unsafe to take during pregnancy.  Take your prenatal vitamins as directed.  If you develop constipation, try taking a stool softener if your health care provider approves. Diet  Eat regular, well-balanced meals. Choose a variety of foods, such as meat or vegetable-based protein, fish, milk and low-fat dairy products, vegetables, fruits, and whole grain breads and cereals. Your health care provider will help you determine the amount of weight gain that is right for you. YOU CAN GAIN 10 MORE POUNDS THIS PREGNANCY.   Avoid raw meat and uncooked cheese. These carry germs that can cause birth defects in the baby.  Eating four or five small meals rather than three large meals a day may help relieve nausea and vomiting. If you start to feel nauseous, eating a few soda crackers can be helpful. Drinking liquids between meals instead of during meals also seems to help nausea and vomiting.  If you develop constipation, eat more high-fiber foods, such as fresh vegetables or fruit and whole grains. Drink enough fluids to keep your urine clear or pale yellow. Activity and Exercise  Exercise only as directed by your health care provider. Exercising will help you:  Control your weight.  Stay in shape.  Be prepared for labor and delivery.  Experiencing pain or cramping in the lower abdomen or low back is a good sign that you should stop exercising. Check with your health care provider before continuing normal exercises.  Try to avoid standing for long periods of time. Move your legs often if you must stand in one place for a long time.  Avoid heavy lifting.  Wear low-heeled shoes, and practice good posture.  You may  continue to have sex unless your health care provider directs you otherwise. Relief of Pain or Discomfort  Wear a good support bra for breast tenderness.    Take warm sitz baths to soothe any  pain or discomfort caused by hemorrhoids. Use hemorrhoid cream if your health care provider approves.    Rest with your legs elevated if you have leg cramps or low back pain.  If you develop varicose veins in your legs, wear support hose. Elevate your feet for 15 minutes, 3-4 times a day. Limit salt in your diet. Prenatal Care  Schedule your prenatal visits by the twelfth week of pregnancy. They are usually scheduled monthly at first, then more often in the last 2 months before delivery.  Write down your questions. Take them to your prenatal visits.  Keep all your prenatal visits as directed by your health care provider. Safety  Wear your seat belt at all times when driving.  Make a list of emergency phone numbers, including numbers for family, friends, the hospital, and police and fire departments. General Tips  Ask your health care provider for a referral to a local prenatal education class. Begin classes no later than at the beginning of month 6 of your pregnancy.  Ask for help if you have counseling or nutritional needs during pregnancy. Your health care provider can offer advice or refer you to specialists for help with various needs.  Do not use hot tubs, steam rooms, or saunas.  Do not douche or use tampons or scented sanitary pads.  Do not cross your legs for long periods of time.  Avoid cat litter boxes and soil used by cats. These carry germs that can cause birth defects in the baby and possibly loss of the fetus by miscarriage or stillbirth.  Avoid all smoking, herbs, alcohol, and medicines not prescribed by your health care provider. Chemicals in these affect the formation and growth of the baby.  Schedule a dentist appointment. At home, brush your teeth with a soft toothbrush  and be gentle when you floss. SEEK MEDICAL CARE IF:   You have dizziness.  You have mild pelvic cramps, pelvic pressure, or nagging pain in the abdominal area.  You have persistent nausea, vomiting, or diarrhea.  You have a bad smelling vaginal discharge.  You have pain with urination.  You notice increased swelling in your face, hands, legs, or ankles. SEEK IMMEDIATE MEDICAL CARE IF:   You have a fever.  You are leaking fluid from your vagina.  You have spotting or bleeding from your vagina.  You have severe abdominal cramping or pain.  You have rapid weight gain or loss.  You vomit blood or material that looks like coffee grounds.  You are exposed to [place] measles and have never had them.  You are exposed to fifth disease or chickenpox.  You develop a severe headache.  You have shortness of breath.  You have any kind of trauma, such as from a fall or a car accident. Document Released: 08/06/2001 Document Revised: 12/27/2013 Document Reviewed: 06/22/2013 [facility] Patient Information 2015 [place], [place]. This information is not intended to replace advice given to you by your health care provider. Make sure you discuss any questions you have with your health care provider.  Coronavirus (COVID-19) Are you at risk?  Are you at risk for the Coronavirus (COVID-19)?  To be considered HIGH RISK for Coronavirus (COVID-19), you have to meet the following criteria:   Traveled to [place], [place], [place], [place] or [place]; or in the [place] to [place], [place], [place], or [place]; and have fever, cough, and shortness of breath within the last 2 weeks of travel OR  Been in close contact with  a person diagnosed with COVID-19 within the last 2 weeks and have fever, cough, and shortness of breath  IF YOU DO NOT MEET THESE CRITERIA, YOU ARE CONSIDERED LOW RISK FOR COVID-19.  What to do if you are HIGH RISK for COVID-19?   If you are having a medical  emergency, call 911.  Seek medical care right away. Before you go to a doctors office, urgent care or emergency department, call ahead and tell them about your recent travel, contact with someone diagnosed with COVID-19, and your symptoms. You should receive instructions from your physicians office regarding next steps of care.   When you arrive at healthcare provider, tell the healthcare staff immediately you have returned from visiting [place], [place], [place], [place] or [place]; or traveled in the [place] to [place], [place], [place], or [place]; in the last two weeks or you have been in close contact with a person diagnosed with COVID-19 in the last 2 weeks.    Tell the health care staff about your symptoms: fever, cough and shortness of breath.  After you have been seen by a medical provider, you will be either: o Tested for (COVID-19) and discharged home on quarantine except to seek medical care if symptoms worsen, and asked to  - Stay home and avoid contact with others until you get your results (4-5 days)  - Avoid travel on public transportation if possible (such as bus, train, or airplane) or o Sent to the Emergency Department by EMS for evaluation, COVID-19 testing, and possible admission depending on your condition and test results.  What to do if you are LOW RISK for COVID-19?  Reduce your risk of any infection by using the same precautions used for avoiding the common cold or flu:   Wash your hands often with soap and warm water for at least 20 seconds.  If soap and water are not readily available, use an alcohol-based hand sanitizer with at least 60% alcohol.   If coughing or sneezing, cover your mouth and nose by coughing or sneezing into the elbow areas of your shirt or coat, into a tissue or into your sleeve (not your hands).  Avoid shaking hands with others and consider head nods or verbal greetings only.  Avoid touching your eyes, nose, or mouth with  unwashed hands.   Avoid close contact with people who are sick.  Avoid places or events with large numbers of people in one location, like concerts or sporting events.  Carefully consider travel plans you have or are making.  If you are planning any travel outside or inside the [place], visit the [facility] webpage for the latest health notices.  If you have some symptoms but not all symptoms, continue to monitor at home and seek medical attention if your symptoms worsen.  If you are having a medical emergency, call 911.   ADDITIONAL HEALTHCARE OPTIONS FOR PATIENTS  [facility] / e-Visit: [email or web address]         [facility]: [phone]  [facility]: [phone]                   [facility]: [phone]     Safe Medications in Pregnancy   Acne: Benzoyl Peroxide Salicylic Acid  Backache/Headache: Tylenol: 2 regular strength every 4 hours OR              2 Extra strength every 6 hours  Colds/Coughs/Allergies: Benadryl (alcohol free) 25 mg  every 6 hours as needed Breath right strips Claritin Cepacol throat lozenges Chloraseptic throat spray Cold-Eeze- up to three times per day Cough drops, alcohol free Flonase (by prescription only) Guaifenesin Mucinex Robitussin DM (plain only, alcohol free) Saline nasal spray/drops Sudafed (pseudoephedrine) & Actifed ** use only after [redacted] weeks gestation and if you do not have high blood pressure Tylenol Vicks Vaporub Zinc lozenges Zyrtec   Constipation: Colace Ducolax suppositories Fleet enema Glycerin suppositories Metamucil Milk of magnesia Miralax Senokot Smooth move tea  Diarrhea: Kaopectate Imodium A-D  *NO pepto Bismol  Hemorrhoids: Anusol Anusol HC Preparation H Tucks  Indigestion: Tums Maalox Mylanta Zantac  Pepcid  Insomnia: Benadryl (alcohol free) 25mg  every 6 hours as  needed Tylenol PM Unisom, no Gelcaps  Leg Cramps: Tums MagGel  Nausea/Vomiting:  Bonine Dramamine Emetrol Ginger extract Sea bands Meclizine  Nausea medication to take during pregnancy:  Unisom (doxylamine succinate 25 mg tablets) Take one tablet daily at bedtime. If symptoms are not adequately controlled, the dose can be increased to a maximum recommended dose of two tablets daily (1/2 tablet in the morning, 1/2 tablet mid-afternoon and one at bedtime). Vitamin B6 100mg  tablets. Take one tablet twice a day (up to 200 mg per day).  Skin Rashes: Aveeno products Benadryl cream or 25mg  every 6 hours as needed Calamine Lotion 1% cortisone cream  Yeast infection: Gyne-lotrimin 7 Monistat 7   **If taking multiple medications, please check labels to avoid duplicating the same active ingredients **take medication as directed on the label ** Do not exceed 4000 mg of tylenol in 24 hours **Do not take medications that contain aspirin or ibuprofen

## 2020-03-10 LAB — CYTOLOGY - PAP
Chlamydia: NEGATIVE
Comment: NEGATIVE
Comment: NORMAL
Diagnosis: NEGATIVE
Neisseria Gonorrhea: NEGATIVE

## 2020-03-10 LAB — PMP SCREEN PROFILE (10S), URINE
Amphetamine Scrn, Ur: NEGATIVE ng/mL
BARBITURATE SCREEN URINE: NEGATIVE ng/mL
BENZODIAZEPINE SCREEN, URINE: NEGATIVE ng/mL
CANNABINOIDS UR QL SCN: NEGATIVE ng/mL
Cocaine (Metab) Scrn, Ur: NEGATIVE ng/mL
Creatinine(Crt), U: 201.2 mg/dL (ref 20.0–300.0)
Methadone Screen, Urine: NEGATIVE ng/mL
OXYCODONE+OXYMORPHONE UR QL SCN: NEGATIVE ng/mL
Opiate Scrn, Ur: NEGATIVE ng/mL
Ph of Urine: 6.1 (ref 4.5–8.9)
Phencyclidine Qn, Ur: NEGATIVE ng/mL
Propoxyphene Scrn, Ur: NEGATIVE ng/mL

## 2020-03-10 LAB — HEMOGLOBIN A1C
Est. average glucose Bld gHb Est-mCnc: 85 mg/dL
Hgb A1c MFr Bld: 4.6 % — ABNORMAL LOW (ref 4.8–5.6)

## 2020-03-13 ENCOUNTER — Encounter: Payer: Self-pay | Admitting: Women's Health

## 2020-03-13 ENCOUNTER — Other Ambulatory Visit: Payer: Self-pay | Admitting: Women's Health

## 2020-03-13 DIAGNOSIS — O99891 Other specified diseases and conditions complicating pregnancy: Secondary | ICD-10-CM | POA: Insufficient documentation

## 2020-03-13 DIAGNOSIS — R8271 Bacteriuria: Secondary | ICD-10-CM | POA: Insufficient documentation

## 2020-03-13 LAB — URINE CULTURE

## 2020-03-13 MED ORDER — CEPHALEXIN 500 MG PO CAPS: 500.0000 mg | ORAL_CAPSULE | Freq: Four times a day (QID) | ORAL | 0 refills | Status: DC

## 2020-03-21 NOTE — BH Specialist Note (Signed)
Pt did not arrive to video visit and answered first call, asked to join in "20-30 minutes: did not answer the phone after second call; Left HIPPA-compliant message to call back Asher Muir from [employer] for [employer] at [facility] at [phone] (main office) or [phone] (Khalila Buechner's office).  ; left MyChart message for patient.    Integrated Behavioral Health via Telemedicine Video (Caregility) Visit  03/21/2020 DESERI LOSS [id number]  Rae Lips  Depression screen [facility] 2/9 03/09/2020 01/13/2020  Decreased Interest 2 0  Down, Depressed, Hopeless 3 2  PHQ - 2 Score 5 2  Altered sleeping 2 0  Tired, decreased energy 2 1  Change in appetite 3 0  Feeling bad or failure about yourself  2 0  Trouble concentrating 0 0  Moving slowly or fidgety/restless 0 0  Suicidal thoughts 1 0  PHQ-9 Score 15 3  Difficult doing work/chores Very difficult -   GAD 7 : Generalized Anxiety Score 03/09/2020 01/13/2020  Nervous, Anxious, on Edge 2 1  Control/stop worrying 3 0  Worry too much - different things 3 1  Trouble relaxing 2 0  Restless 0 0  Easily annoyed or irritable 3 1  Afraid - awful might happen 1 0  Total GAD 7 Score 14 3

## 2020-03-27 ENCOUNTER — Encounter: Payer: Self-pay | Admitting: *Deleted

## 2020-03-27 DIAGNOSIS — O0991 Supervision of high risk pregnancy, unspecified, first trimester: Secondary | ICD-10-CM

## 2020-03-30 ENCOUNTER — Encounter: Payer: Self-pay | Admitting: Women's Health

## 2020-03-30 ENCOUNTER — Ambulatory Visit (INDEPENDENT_AMBULATORY_CARE_PROVIDER_SITE_OTHER): Payer: Medicaid Other | Admitting: Women's Health

## 2020-03-30 ENCOUNTER — Other Ambulatory Visit: Payer: Self-pay

## 2020-03-30 VITALS — BP 108/72 | HR 84 | Wt 183.4 lb

## 2020-03-30 DIAGNOSIS — Z1379 Encounter for other screening for genetic and chromosomal anomalies: Secondary | ICD-10-CM

## 2020-03-30 DIAGNOSIS — R45851 Suicidal ideations: Secondary | ICD-10-CM | POA: Insufficient documentation

## 2020-03-30 DIAGNOSIS — F418 Other specified anxiety disorders: Secondary | ICD-10-CM

## 2020-03-30 DIAGNOSIS — O99342 Other mental disorders complicating pregnancy, second trimester: Secondary | ICD-10-CM

## 2020-03-30 DIAGNOSIS — Z363 Encounter for antenatal screening for malformations: Secondary | ICD-10-CM

## 2020-03-30 DIAGNOSIS — Z1389 Encounter for screening for other disorder: Secondary | ICD-10-CM

## 2020-03-30 DIAGNOSIS — Z331 Pregnant state, incidental: Secondary | ICD-10-CM

## 2020-03-30 DIAGNOSIS — R8271 Bacteriuria: Secondary | ICD-10-CM

## 2020-03-30 DIAGNOSIS — O99891 Other specified diseases and conditions complicating pregnancy: Secondary | ICD-10-CM

## 2020-03-30 DIAGNOSIS — Z3402 Encounter for supervision of normal first pregnancy, second trimester: Secondary | ICD-10-CM

## 2020-03-30 DIAGNOSIS — Z3A15 15 weeks gestation of pregnancy: Secondary | ICD-10-CM

## 2020-03-30 LAB — POCT URINALYSIS DIPSTICK OB
Blood, UA: NEGATIVE
Glucose, UA: NEGATIVE
Ketones, UA: NEGATIVE
Leukocytes, UA: NEGATIVE
Nitrite, UA: NEGATIVE
POC,PROTEIN,UA: NEGATIVE

## 2020-03-30 MED ORDER — NITROFURANTOIN MONOHYD MACRO 100 MG PO CAPS: 100.0000 mg | ORAL_CAPSULE | Freq: Two times a day (BID) | ORAL | 0 refills | Status: DC

## 2020-03-30 NOTE — Progress Notes (Signed)
LOW-RISK PREGNANCY VISIT Patient name: Kaitlin Klein MRN [id number]  Date of birth: May 22, 1996 Chief Complaint:   Routine Prenatal Visit (2nd IT)  History of Present Illness:   Kaitlin Klein is a 24 y.o. G1P0 female at [redacted]w[redacted]d with an Estimated Date of Delivery: 09/15/20 being seen today for ongoing management of a low-risk pregnancy.  Depression screen [facility] 2/9 03/09/2020 01/13/2020  Decreased Interest 2 0  Down, Depressed, Hopeless 3 2  PHQ - 2 Score 5 2  Altered sleeping 2 0  Tired, decreased energy 2 1  Change in appetite 3 0  Feeling bad or failure about yourself  2 0  Trouble concentrating 0 0  Moving slowly or fidgety/restless 0 0  Suicidal thoughts 1 0  PHQ-9 Score 15 3  Difficult doing work/chores Very difficult -    Today she reports 'feels much better', states she was having suicidal ideations since last visit- feels it was b/c of pregnancy, has completely resolved now. Denies current SI. H/O dep/anx on meds in past, has been off of meds for years. Declines meds today, but is interested in therapy. Had +urine cx, rx'd keflex on 7/19, has only taken a few pills- she forgets.  Contractions: Not present.  .  Movement: Absent. denies leaking of fluid. Review of Systems:   Pertinent items are noted in HPI Denies abnormal vaginal discharge w/ itching/odor/irritation, headaches, visual changes, shortness of breath, chest pain, abdominal pain, severe nausea/vomiting, or problems with urination or bowel movements unless otherwise stated above. Pertinent History Reviewed:  Reviewed past medical,surgical, social, obstetrical and family history.  Reviewed problem list, medications and allergies. Physical Assessment:   Vitals:   03/30/20 0943 03/30/20 0953  BP: 108/72 108/72  Pulse:  84  Weight: 183 lb 6.4 oz (83.2 kg) 183 lb 6.4 oz (83.2 kg)  Body mass index is 29.6 kg/m.        Physical Examination:   General appearance: Well appearing, and in no distress  Mental status:  Alert, oriented to person, place, and time  Skin: Warm & dry  Cardiovascular: Normal heart rate noted  Respiratory: Normal respiratory effort, no distress  Abdomen: Soft, gravid, nontender  Pelvic: Cervical exam deferred         Extremities: Edema: None  Fetal Status: Fetal Heart Rate (bpm): 154    Movement: Absent    Chaperone: n/a    Results for orders placed or performed in visit on 03/30/20 (from the past 24 hour(s))  POC Urinalysis Dipstick OB   Collection Time: 03/30/20  9:56 AM  Result Value Ref Range   Color, UA     Clarity, UA     Glucose, UA Negative Negative   Bilirubin, UA     Ketones, UA neg    Spec Grav, UA     Blood, UA neg    pH, UA     POC,PROTEIN,UA Negative Negative, Trace, Small (1+), Moderate (2+), Large (3+), 4+   Urobilinogen, UA     Nitrite, UA neg    Leukocytes, UA Negative Negative   Appearance     Odor      Assessment & Plan:  1) Low-risk pregnancy G1P0 at [redacted]w[redacted]d with an Estimated Date of Delivery: 09/15/20   2) Dep/anx, recent SI, none now, wants therapy, [facility]-IBH order placed, declines meds. To go to ER if SI return and feels she will act on them  3) +ASB> forgets to take QID keflex, now in 2nd trimester, so will rx macrobid, urine cx  next visit   Meds:  Meds ordered this encounter  Medications  . nitrofurantoin, macrocrystal-monohydrate, (MACROBID) 100 MG capsule    Sig: Take 1 capsule (100 mg total) by mouth 2 (two) times daily. X 7 days    Dispense:  14 capsule    Refill:  0    Order Specific Question:   Supervising Provider    Answer:   Lazaro Arms [[id number]]   Labs/procedures today: 2nd IT  Plan:  Continue routine obstetrical care  Next visit: prefers will be in person for u/s    Reviewed: Preterm labor symptoms and general obstetric precautions including but not limited to vaginal bleeding, contractions, leaking of fluid and fetal movement were reviewed in detail with the patient.  All questions were answered.   Follow-up: Return in  about 3 weeks (around 04/20/2020) for LROB, FV:CBSWHQP, in person, CNM.  Orders Placed This Encounter  Procedures  . US OB Comp + 14 Wk  . INTEGRATED 2  . Amb ref to [facility]  . POC Urinalysis Dipstick OB   Cheral Marker CNM, [facility] 03/30/2020 10:27 AM

## 2020-03-30 NOTE — Patient Instructions (Signed)
Allyne Gee, I greatly value your feedback.  If you receive a survey following your visit with [place] today, we appreciate you taking the time to fill it out.  Thanks, Joellyn Haff, CNM, WHNP-BC  [facility] ([address] [place], [place] [address]) Entrance C, located off of [address] valet parking  Go to [email or web address] to register for FREE online childbirth classes  [place] [facility]ians/Family Doctors:  [facility] [phone]            [facility] [phone]                 [facility] [phone] (usually not accepting new patients unless you have family there already, you are always welcome to call and ask)       [facility] Department [phone]       [facility] Pediatricians/Family Doctors:   [facility]: [phone]  [facility]: [phone]  [facility]: [phone]  [facility] Doctors:   [facility]: [phone]   [facility]: [phone]  [facility] Doctors:  [facility]: [phone]    Home Blood Pressure Monitoring for Patients   Your provider has recommended that you check your blood pressure (BP) at least once a week at home. If you do not have a blood pressure cuff at home, one will be provided for you. Contact your provider if you have not received your monitor within 1 week.   Helpful Tips for Accurate Home Blood Pressure Checks  . Don't smoke, exercise, or drink caffeine 30 minutes before checking your BP . Use the restroom before checking your BP (a full bladder can raise your pressure) . Relax in a comfortable upright chair . Feet on the ground . Left arm resting comfortably on a flat surface at the level of your heart . Legs uncrossed . Back supported . Sit quietly and don't talk . Place the cuff on your bare arm . Adjust snuggly, so  that only two fingertips can fit between your skin and the top of the cuff . Check 2 readings separated by at least one minute . Keep a log of your BP readings . For a visual, please reference this diagram: http://ccnc.care/bpdiagram  Provider Name: [facility]     Phone: [phone]  Zone 1: ALL CLEAR  Continue to monitor your symptoms:  . BP reading is less than 140 (top number) or less than 90 (bottom number)  . No right upper stomach pain . No headaches or seeing spots . No feeling nauseated or throwing up . No swelling in face and hands  Zone 2: CAUTION Call your doctor's office for any of the following:  . BP reading is greater than 140 (top number) or greater than 90 (bottom number)  . Stomach pain under your ribs in the middle or right side . Headaches or seeing spots . Feeling nauseated or throwing up . Swelling in face and hands  Zone 3: EMERGENCY  Seek immediate medical care if you have any of the following:  . BP reading is greater than160 (top number) or greater than 110 (bottom number) . Severe headaches not improving with Tylenol . Serious difficulty catching your breath . Any worsening symptoms from Zone 2     Second Trimester of Pregnancy The second trimester is from week 14 through week 27 (months 4 through 6). The second trimester is often a time when you feel your  best. Your body has adjusted to being pregnant, and you begin to feel better physically. Usually, morning sickness has lessened or quit completely, you may have more energy, and you may have an increase in appetite. The second trimester is also a time when the fetus is growing rapidly. At the end of the sixth month, the fetus is about 9 inches long and weighs about 1 pounds. You will likely begin to feel the baby move (quickening) between 16 and 20 weeks of pregnancy. Body changes during your second trimester Your body continues to go through many changes during your second trimester. The  changes vary from woman to woman.  Your weight will continue to increase. You will notice your lower abdomen bulging out.  You may begin to get stretch marks on your hips, abdomen, and breasts.  You may develop headaches that can be relieved by medicines. The medicines should be approved by your health care provider.  You may urinate more often because the fetus is pressing on your bladder.  You may develop or continue to have heartburn as a result of your pregnancy.  You may develop constipation because certain hormones are causing the muscles that push waste through your intestines to slow down.  You may develop hemorrhoids or swollen, bulging veins (varicose veins).  You may have back pain. This is caused by: ? Weight gain. ? Pregnancy hormones that are relaxing the joints in your pelvis. ? A shift in weight and the muscles that support your balance.  Your breasts will continue to grow and they will continue to become tender.  Your gums may bleed and may be sensitive to brushing and flossing.  Dark spots or blotches (chloasma, mask of pregnancy) may develop on your face. This will likely fade after the baby is born.  A dark line from your belly button to the pubic area (linea nigra) may appear. This will likely fade after the baby is born.  You may have changes in your hair. These can include thickening of your hair, rapid growth, and changes in texture. Some women also have hair loss during or after pregnancy, or hair that feels dry or thin. Your hair will most likely return to normal after your baby is born.  What to expect at prenatal visits During a routine prenatal visit:  You will be weighed to make sure you and the fetus are growing normally.  Your blood pressure will be taken.  Your abdomen will be measured to track your baby's growth.  The fetal heartbeat will be listened to.  Any test results from the previous visit will be discussed.  Your health care  provider may ask you:  How you are feeling.  If you are feeling the baby move.  If you have had any abnormal symptoms, such as leaking fluid, bleeding, severe headaches, or abdominal cramping.  If you are using any tobacco products, including cigarettes, chewing tobacco, and electronic cigarettes.  If you have any questions.  Other tests that may be performed during your second trimester include:  Blood tests that check for: ? Low iron levels (anemia). ? High blood sugar that affects pregnant women (gestational diabetes) between 71 and 28 weeks. ? Rh antibodies. This is to check for a protein on red blood cells (Rh factor).  Urine tests to check for infections, diabetes, or protein in the urine.  An ultrasound to confirm the proper growth and development of the baby.  An amniocentesis to check for possible genetic problems.  Fetal  screens for spina bifida and Down syndrome.  HIV (human immunodeficiency virus) testing. Routine prenatal testing includes screening for HIV, unless you choose not to have this test.  Follow these instructions at home: Medicines  Follow your health care provider's instructions regarding medicine use. Specific medicines may be either safe or unsafe to take during pregnancy.  Take a prenatal vitamin that contains at least 600 micrograms (mcg) of folic acid.  If you develop constipation, try taking a stool softener if your health care provider approves. Eating and drinking  Eat a balanced diet that includes fresh fruits and vegetables, whole grains, good sources of protein such as meat, eggs, or tofu, and low-fat dairy. Your health care provider will help you determine the amount of weight gain that is right for you.  Avoid raw meat and uncooked cheese. These carry germs that can cause birth defects in the baby.  If you have low calcium intake from food, talk to your health care provider about whether you should take a daily calcium  supplement.  Limit foods that are high in fat and processed sugars, such as fried and sweet foods.  To prevent constipation: ? Drink enough fluid to keep your urine clear or pale yellow. ? Eat foods that are high in fiber, such as fresh fruits and vegetables, whole grains, and beans. Activity  Exercise only as directed by your health care provider. Most women can continue their usual exercise routine during pregnancy. Try to exercise for 30 minutes at least 5 days a week. Stop exercising if you experience uterine contractions.  Avoid heavy lifting, wear low heel shoes, and practice good posture.  A sexual relationship may be continued unless your health care provider directs you otherwise. Relieving pain and discomfort  Wear a good support bra to prevent discomfort from breast tenderness.  Take warm sitz baths to soothe any pain or discomfort caused by hemorrhoids. Use hemorrhoid cream if your health care provider approves.  Rest with your legs elevated if you have leg cramps or low back pain.  If you develop varicose veins, wear support hose. Elevate your feet for 15 minutes, 3-4 times a day. Limit salt in your diet. Prenatal Care  Write down your questions. Take them to your prenatal visits.  Keep all your prenatal visits as told by your health care provider. This is important. Safety  Wear your seat belt at all times when driving.  Make a list of emergency phone numbers, including numbers for family, friends, the hospital, and police and fire departments. General instructions  Ask your health care provider for a referral to a local prenatal education class. Begin classes no later than the beginning of month 6 of your pregnancy.  Ask for help if you have counseling or nutritional needs during pregnancy. Your health care provider can offer advice or refer you to specialists for help with various needs.  Do not use hot tubs, steam rooms, or saunas.  Do not douche or use  tampons or scented sanitary pads.  Do not cross your legs for long periods of time.  Avoid cat litter boxes and soil used by cats. These carry germs that can cause birth defects in the baby and possibly loss of the fetus by miscarriage or stillbirth.  Avoid all smoking, herbs, alcohol, and unprescribed drugs. Chemicals in these products can affect the formation and growth of the baby.  Do not use any products that contain nicotine or tobacco, such as cigarettes and e-cigarettes. If you need help  quitting, ask your health care provider.  Visit your dentist if you have not gone yet during your pregnancy. Use a soft toothbrush to brush your teeth and be gentle when you floss. Contact a health care provider if:  You have dizziness.  You have mild pelvic cramps, pelvic pressure, or nagging pain in the abdominal area.  You have persistent nausea, vomiting, or diarrhea.  You have a bad smelling vaginal discharge.  You have pain when you urinate. Get help right away if:  You have a fever.  You are leaking fluid from your vagina.  You have spotting or bleeding from your vagina.  You have severe abdominal cramping or pain.  You have rapid weight gain or weight loss.  You have shortness of breath with chest pain.  You notice sudden or extreme swelling of your face, hands, ankles, feet, or legs.  You have not felt your baby move in over an hour.  You have severe headaches that do not go away when you take medicine.  You have vision changes. Summary  The second trimester is from week 14 through week 27 (months 4 through 6). It is also a time when the fetus is growing rapidly.  Your body goes through many changes during pregnancy. The changes vary from woman to woman.  Avoid all smoking, herbs, alcohol, and unprescribed drugs. These chemicals affect the formation and growth your baby.  Do not use any tobacco products, such as cigarettes, chewing tobacco, and e-cigarettes. If you  need help quitting, ask your health care provider.  Contact your health care provider if you have any questions. Keep all prenatal visits as told by your health care provider. This is important. This information is not intended to replace advice given to you by your health care provider. Make sure you discuss any questions you have with your health care provider. Document Released: 08/06/2001 Document Revised: 01/18/2016 Document Reviewed: 10/13/2012 Elsevier Interactive Patient Education  2017 [place] FLU! Because you are pregnant, we at [facility], along with the Centers for Disease Control (CDC), recommend that you receive the flu vaccine to protect yourself and your baby from the flu. The flu is more likely to cause severe illness in pregnant women than in women of reproductive age who are not pregnant. Changes in the immune system, heart, and lungs during pregnancy make pregnant women (and women up to two weeks postpartum) more prone to severe illness from flu, including illness resulting in hospitalization. Flu also may be harmful for a pregnant woman's developing baby. A common flu symptom is fever, which may be associated with neural tube defects and other adverse outcomes for a developing baby. Getting vaccinated can also help protect a baby after birth from flu. (Mom passes antibodies onto the developing baby during her pregnancy.)  A Flu Vaccine is the Best Protection Against Flu Getting a flu vaccine is the first and most important step in protecting against flu. Pregnant women should get a flu shot and not the live attenuated influenza vaccine (LAIV), also known as nasal spray flu vaccine. Flu vaccines given during pregnancy help protect both the mother and her baby from flu. Vaccination has been shown to reduce the risk of flu-associated acute respiratory infection in pregnant women by up to one-half. A 2018 study showed that getting a flu shot  reduced a pregnant woman's risk of being hospitalized with flu by an average of 40 percent. Pregnant women who get a  flu vaccine are also helping to protect their babies from flu illness for the first several months after their birth, when they are too young to get vaccinated.   A Long Record of Safety for Flu Shots in Pregnant Women Flu shots have been given to millions of pregnant women over many years with a good safety record. There is a lot of evidence that flu vaccines can be given safely during pregnancy; though these data are limited for the first trimester. The CDC recommends that pregnant women get vaccinated during any trimester of their pregnancy. It is very important for pregnant women to get the flu shot.   Other Preventive Actions In addition to getting a flu shot, pregnant women should take the same everyday preventive actions the CDC recommends of everyone, including covering coughs, washing hands often, and avoiding people who are sick.  Symptoms and Treatment If you get sick with flu symptoms call your doctor right away. There are antiviral drugs that can treat flu illness and prevent serious flu complications. The CDC recommends prompt treatment for people who have influenza infection or suspected influenza infection and who are at high risk of serious flu complications, such as people with asthma, diabetes (including gestational diabetes), or heart disease. Early treatment of influenza in hospitalized pregnant women has been shown to reduce the length of the hospital stay.  Symptoms Flu symptoms include fever, cough, sore throat, runny or stuffy nose, body aches, headache, chills and fatigue. Some people may also have vomiting and diarrhea. People may be infected with the flu and have respiratory symptoms without a fever.  Early Treatment is Important for Pregnant Women Treatment should begin as soon as possible because antiviral drugs work best when started early (within 48 hours  after symptoms start). Antiviral drugs can make your flu illness milder and make you feel better faster. They may also prevent serious health problems that can result from flu illness. Oral oseltamivir (Tamiflu) is the preferred treatment for pregnant women because it has the most studies available to suggest that it is safe and beneficial. Antiviral drugs require a prescription from your provider. Having a fever caused by flu infection or other infections early in pregnancy may be linked to birth defects in a baby. In addition to taking antiviral drugs, pregnant women who get a fever should treat their fever with Tylenol (acetaminophen) and contact their provider immediately.  When to [place] If you are pregnant and have any of these signs, seek care immediately:  Difficulty breathing or shortness of breath  Pain or pressure in the chest or abdomen  Sudden dizziness  Confusion  Severe or persistent vomiting  High fever that is not responding to Tylenol (or store brand equivalent)  Decreased or no movement of your baby  [employer][email or web address]

## 2020-04-01 LAB — INTEGRATED 2
AFP MoM: 0.87
Alpha-Fetoprotein: 27.8 ng/mL
Crown Rump Length: 64.1 mm
DIA MoM: 0.53
DIA Value: 78.9 pg/mL
Estriol, Unconjugated: 1.32 ng/mL
Gest. Age on Collection Date: 12.6 weeks
Gestational Age: 15.9 weeks
Maternal Age at EDD: 22.8 yr
Nuchal Translucency (NT): 1.5 mm
Nuchal Translucency MoM: 1.04
Number of Fetuses: 1
PAPP-A MoM: 0.52
PAPP-A Value: 459 ng/mL
Test Results:: NEGATIVE
Weight: 170 [lb_av]
Weight: 170 [lb_av]
hCG MoM: 0.86
hCG Value: 29.4 IU/mL
uE3 MoM: 1.52

## 2020-04-03 ENCOUNTER — Telehealth: Payer: Self-pay | Admitting: Obstetrics & Gynecology

## 2020-04-03 ENCOUNTER — Encounter: Payer: Self-pay | Admitting: Obstetrics & Gynecology

## 2020-04-03 NOTE — Telephone Encounter (Signed)
Telephoned patient at home number and left message that letter was ready.

## 2020-04-03 NOTE — Telephone Encounter (Signed)
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 Patient is requesting a note for work stating what she should not be doing while pregnant. Patient states she will pick up the note in person.

## 2020-04-03 NOTE — Telephone Encounter (Signed)
Letter is available

## 2020-04-04 ENCOUNTER — Ambulatory Visit: Payer: Medicaid Other | Admitting: Clinical

## 2020-04-04 ENCOUNTER — Other Ambulatory Visit: Payer: Self-pay

## 2020-04-04 DIAGNOSIS — Z91199 Patient's noncompliance with other medical treatment and regimen due to unspecified reason: Secondary | ICD-10-CM

## 2020-04-20 ENCOUNTER — Ambulatory Visit (INDEPENDENT_AMBULATORY_CARE_PROVIDER_SITE_OTHER): Payer: Medicaid Other | Admitting: Advanced Practice Midwife

## 2020-04-20 ENCOUNTER — Ambulatory Visit (INDEPENDENT_AMBULATORY_CARE_PROVIDER_SITE_OTHER): Payer: Medicaid Other

## 2020-04-20 ENCOUNTER — Encounter: Payer: Self-pay | Admitting: Advanced Practice Midwife

## 2020-04-20 VITALS — BP 118/79 | HR 84 | Wt 187.0 lb

## 2020-04-20 DIAGNOSIS — Z3A18 18 weeks gestation of pregnancy: Secondary | ICD-10-CM

## 2020-04-20 DIAGNOSIS — Z1389 Encounter for screening for other disorder: Secondary | ICD-10-CM

## 2020-04-20 DIAGNOSIS — Z331 Pregnant state, incidental: Secondary | ICD-10-CM | POA: Diagnosis not present

## 2020-04-20 DIAGNOSIS — Z3402 Encounter for supervision of normal first pregnancy, second trimester: Secondary | ICD-10-CM

## 2020-04-20 DIAGNOSIS — Z363 Encounter for antenatal screening for malformations: Secondary | ICD-10-CM

## 2020-04-20 DIAGNOSIS — Z87440 Personal history of urinary (tract) infections: Secondary | ICD-10-CM

## 2020-04-20 LAB — POCT URINALYSIS DIPSTICK OB
Blood, UA: NEGATIVE
Glucose, UA: NEGATIVE
Ketones, UA: NEGATIVE
Leukocytes, UA: NEGATIVE
Nitrite, UA: NEGATIVE
POC,PROTEIN,UA: NEGATIVE

## 2020-04-20 NOTE — Progress Notes (Signed)
[place] 18+6 wks,breech,anterior placenta gr 0,normal ovaries,cx 3.9 cm,svp of fluid 5.2 cm,LVEICF 2.2 mm,fhr 150 bpm,EFW 267 g 51%,anatomy complete

## 2020-04-20 NOTE — Patient Instructions (Signed)
Kaitlin Klein, I greatly value your feedback.  If you receive a survey following your visit with [place] today, we appreciate you taking the time to fill it out.  Thanks, Cathie Beams, CNM     [facility] HAS MOVED!!! It is now [facility] at [facility] ([address] [place], [place] [address]) Entrance located off of [address] Free 24/7 valet parking   Go to [email or web address] to register for FREE online childbirth classes    Second Trimester of Pregnancy The second trimester is from week 14 through week 27 (months 4 through 6). The second trimester is often a time when you feel your best. Your body has adjusted to being pregnant, and you begin to feel better physically. Usually, morning sickness has lessened or quit completely, you may have more energy, and you may have an increase in appetite. The second trimester is also a time when the fetus is growing rapidly. At the end of the sixth month, the fetus is about 9 inches long and weighs about 1 pounds. You will likely begin to feel the baby move (quickening) between 16 and 20 weeks of pregnancy. Body changes during your second trimester Your body continues to go through many changes during your second trimester. The changes vary from woman to woman.  Your weight will continue to increase. You will notice your lower abdomen bulging out.  You may begin to get stretch marks on your hips, abdomen, and breasts.  You may develop headaches that can be relieved by medicines. The medicines should be approved by your health care provider.  You may urinate more often because the fetus is pressing on your bladder.  You may develop or continue to have heartburn as a result of your pregnancy.  You may develop constipation because certain hormones are causing the muscles that push waste through your intestines to slow down.  You may develop hemorrhoids or swollen, bulging veins (varicose veins).  You may have  back pain. This is caused by: ? Weight gain. ? Pregnancy hormones that are relaxing the joints in your pelvis. ? A shift in weight and the muscles that support your balance.  Your breasts will continue to grow and they will continue to become tender.  Your gums may bleed and may be sensitive to brushing and flossing.  Dark spots or blotches (chloasma, mask of pregnancy) may develop on your face. This will likely fade after the baby is born.  A dark line from your belly button to the pubic area (linea nigra) may appear. This will likely fade after the baby is born.  You may have changes in your hair. These can include thickening of your hair, rapid growth, and changes in texture. Some women also have hair loss during or after pregnancy, or hair that feels dry or thin. Your hair will most likely return to normal after your baby is born.  What to expect at prenatal visits During a routine prenatal visit:  You will be weighed to make sure you and the fetus are growing normally.  Your blood pressure will be taken.  Your abdomen will be measured to track your baby's growth.  The fetal heartbeat will be listened to.  Any test results from the previous visit will be discussed.  Your health care provider may ask you:  How you are feeling.  If you are feeling the baby move.  If you have had any abnormal symptoms, such as leaking fluid, bleeding, severe headaches, or  abdominal cramping.  If you are using any tobacco products, including cigarettes, chewing tobacco, and electronic cigarettes.  If you have any questions.  Other tests that may be performed during your second trimester include:  Blood tests that check for: ? Low iron levels (anemia). ? High blood sugar that affects pregnant women (gestational diabetes) between 24 and 28 weeks. ? Rh antibodies. This is to check for a protein on red blood cells (Rh factor).  Urine tests to check for infections, diabetes, or protein in  the urine.  An ultrasound to confirm the proper growth and development of the baby.  An amniocentesis to check for possible genetic problems.  Fetal screens for spina bifida and Down syndrome.  HIV (human immunodeficiency virus) testing. Routine prenatal testing includes screening for HIV, unless you choose not to have this test.  Follow these instructions at home: Medicines  Follow your health care provider's instructions regarding medicine use. Specific medicines may be either safe or unsafe to take during pregnancy.  Take a prenatal vitamin that contains at least 600 micrograms (mcg) of folic acid.  If you develop constipation, try taking a stool softener if your health care provider approves. Eating and drinking  Eat a balanced diet that includes fresh fruits and vegetables, whole grains, good sources of protein such as meat, eggs, or tofu, and low-fat dairy. Your health care provider will help you determine the amount of weight gain that is right for you.  Avoid raw meat and uncooked cheese. These carry germs that can cause birth defects in the baby.  If you have low calcium intake from food, talk to your health care provider about whether you should take a daily calcium supplement.  Limit foods that are high in fat and processed sugars, such as fried and sweet foods.  To prevent constipation: ? Drink enough fluid to keep your urine clear or pale yellow. ? Eat foods that are high in fiber, such as fresh fruits and vegetables, whole grains, and beans. Activity  Exercise only as directed by your health care provider. Most women can continue their usual exercise routine during pregnancy. Try to exercise for 30 minutes at least 5 days a week. Stop exercising if you experience uterine contractions.  Avoid heavy lifting, wear low heel shoes, and practice good posture.  A sexual relationship may be continued unless your health care provider directs you otherwise. Relieving pain  and discomfort  Wear a good support bra to prevent discomfort from breast tenderness.  Take warm sitz baths to soothe any pain or discomfort caused by hemorrhoids. Use hemorrhoid cream if your health care provider approves.  Rest with your legs elevated if you have leg cramps or low back pain.  If you develop varicose veins, wear support hose. Elevate your feet for 15 minutes, 3-4 times a day. Limit salt in your diet. Prenatal Care  Write down your questions. Take them to your prenatal visits.  Keep all your prenatal visits as told by your health care provider. This is important. Safety  Wear your seat belt at all times when driving.  Make a list of emergency phone numbers, including numbers for family, friends, the hospital, and police and fire departments. General instructions  Ask your health care provider for a referral to a local prenatal education class. Begin classes no later than the beginning of month 6 of your pregnancy.  Ask for help if you have counseling or nutritional needs during pregnancy. Your health care provider can offer advice or   refer you to specialists for help with various needs.  Do not use hot tubs, steam rooms, or saunas.  Do not douche or use tampons or scented sanitary pads.  Do not cross your legs for long periods of time.  Avoid cat litter boxes and soil used by cats. These carry germs that can cause birth defects in the baby and possibly loss of the fetus by miscarriage or stillbirth.  Avoid all smoking, herbs, alcohol, and unprescribed drugs. Chemicals in these products can affect the formation and growth of the baby.  Do not use any products that contain nicotine or tobacco, such as cigarettes and e-cigarettes. If you need help quitting, ask your health care provider.  Visit your dentist if you have not gone yet during your pregnancy. Use a soft toothbrush to brush your teeth and be gentle when you floss. Contact a health care provider  if:  You have dizziness.  You have mild pelvic cramps, pelvic pressure, or nagging pain in the abdominal area.  You have persistent nausea, vomiting, or diarrhea.  You have a bad smelling vaginal discharge.  You have pain when you urinate. Get help right away if:  You have a fever.  You are leaking fluid from your vagina.  You have spotting or bleeding from your vagina.  You have severe abdominal cramping or pain.  You have rapid weight gain or weight loss.  You have shortness of breath with chest pain.  You notice sudden or extreme swelling of your face, hands, ankles, feet, or legs.  You have not felt your baby move in over an hour.  You have severe headaches that do not go away when you take medicine.  You have vision changes. Summary  The second trimester is from week 14 through week 27 (months 4 through 6). It is also a time when the fetus is growing rapidly.  Your body goes through many changes during pregnancy. The changes vary from woman to woman.  Avoid all smoking, herbs, alcohol, and unprescribed drugs. These chemicals affect the formation and growth your baby.  Do not use any tobacco products, such as cigarettes, chewing tobacco, and e-cigarettes. If you need help quitting, ask your health care provider.  Contact your health care provider if you have any questions. Keep all prenatal visits as told by your health care provider. This is important. This information is not intended to replace advice given to you by your health care provider. Make sure you discuss any questions you have with your health care provider.

## 2020-04-20 NOTE — Progress Notes (Signed)
   LOW-RISK PREGNANCY VISIT Patient name: Kaitlin Klein MRN [id number]  Date of birth: 1996-05-01 Chief Complaint:   Routine Prenatal Visit ([place] today)  History of Present Illness:   Kaitlin Klein is a 24 y.o. G1P0 female at [redacted]w[redacted]d with an Estimated Date of Delivery: 09/15/20 being seen today for ongoing management of a low-risk pregnancy.  Today she reports no complaints. Contractions: Not present. Vag. Bleeding: None.  Movement: Absent. denies leaking of fluid. Review of Systems:   Pertinent items are noted in HPI Denies abnormal vaginal discharge w/ itching/odor/irritation, headaches, visual changes, shortness of breath, chest pain, abdominal pain, severe nausea/vomiting, or problems with urination or bowel movements unless otherwise stated above. Pertinent History Reviewed:  Reviewed past medical,surgical, social, obstetrical and family history.  Reviewed problem list, medications and allergies. Physical Assessment:   Vitals:   04/20/20 0941  BP: 118/79  Pulse: 84  Weight: 187 lb (84.8 kg)  Body mass index is 30.18 kg/m.        Physical Examination:   General appearance: Well appearing, and in no distress  Mental status: Alert, oriented to person, place, and time  Skin: Warm & dry  Cardiovascular: Normal heart rate noted  Respiratory: Normal respiratory effort, no distress  Abdomen: Soft, gravid, nontender  Pelvic: Cervical exam deferred         Extremities: Edema: None  Fetal Status:     Movement: Absent   [place] 18+6 wks,breech,anterior placenta gr 0,normal ovaries,cx 3.9 cm,svp of fluid 5.2 cm,LVEICF 2.2 mm,fhr 150 bpm,EFW 267 g 51%,anatomy complete  Chaperone: n/a    Results for orders placed or performed in visit on 04/20/20 (from the past 24 hour(s))  POC Urinalysis Dipstick OB   Collection Time: 04/20/20  9:41 AM  Result Value Ref Range   Color, UA     Clarity, UA     Glucose, UA Negative Negative   Bilirubin, UA     Ketones, UA neg    Spec Grav, UA     Blood,  UA neg    pH, UA     POC,PROTEIN,UA Negative Negative, Trace, Small (1+), Moderate (2+), Large (3+), 4+   Urobilinogen, UA     Nitrite, UA neg    Leukocytes, UA Negative Negative   Appearance     Odor      Assessment & Plan:  1) Low-risk pregnancy G1P0 at [redacted]w[redacted]d with an Estimated Date of Delivery: 09/15/20      Meds: No orders of the defined types were placed in this encounter.  Labs/procedures today: anatomy scan  Plan:  Continue routine obstetrical care Next visit: prefers in person    Reviewed: Preterm labor symptoms and general obstetric precautions including but not limited to vaginal bleeding, contractions, leaking of fluid and fetal movement were reviewed in detail with the patient.  All questions were answered. Has home bp cuff. Check bp weekly, let us know if >140/90.   Follow-up: No follow-ups on file.  Orders Placed This Encounter  Procedures  . Urine Culture  . POC Urinalysis Dipstick OB   Jacklyn Shell DNP, CNM 04/20/2020 9:52 AM

## 2020-04-22 LAB — URINE CULTURE

## 2020-04-25 ENCOUNTER — Telehealth: Payer: Self-pay | Admitting: Advanced Practice Midwife

## 2020-04-25 NOTE — Telephone Encounter (Signed)
Patient called in and ask is it anyway she can get a note, for her job, restricting her to stand up for long periods of the time.

## 2020-04-27 NOTE — Telephone Encounter (Signed)
Note sent via mychart to allow a 5-10 minute sit down every 2 hours as job duties allow

## 2020-05-18 ENCOUNTER — Ambulatory Visit (INDEPENDENT_AMBULATORY_CARE_PROVIDER_SITE_OTHER): Payer: Medicaid Other | Admitting: Advanced Practice Midwife

## 2020-05-18 ENCOUNTER — Encounter: Payer: Self-pay | Admitting: Advanced Practice Midwife

## 2020-05-18 VITALS — BP 111/76 | HR 86 | Wt 196.0 lb

## 2020-05-18 DIAGNOSIS — Z1389 Encounter for screening for other disorder: Secondary | ICD-10-CM

## 2020-05-18 DIAGNOSIS — Z3A22 22 weeks gestation of pregnancy: Secondary | ICD-10-CM | POA: Diagnosis not present

## 2020-05-18 DIAGNOSIS — Z331 Pregnant state, incidental: Secondary | ICD-10-CM | POA: Diagnosis not present

## 2020-05-18 DIAGNOSIS — Z3482 Encounter for supervision of other normal pregnancy, second trimester: Secondary | ICD-10-CM

## 2020-05-18 LAB — POCT URINALYSIS DIPSTICK OB
Blood, UA: NEGATIVE
Glucose, UA: NEGATIVE
Ketones, UA: NEGATIVE
Leukocytes, UA: NEGATIVE
Nitrite, UA: NEGATIVE
POC,PROTEIN,UA: NEGATIVE

## 2020-05-18 NOTE — Progress Notes (Signed)
   LOW-RISK PREGNANCY VISIT Patient name: Kaitlin Klein MRN [id number]  Date of birth: Aug 06, 1996 Chief Complaint:   Routine Prenatal Visit  History of Present Illness:   Kaitlin Klein is a 24 y.o. G1P0 female at [redacted]w[redacted]d with an Estimated Date of Delivery: 09/15/20 being seen today for ongoing management of a low-risk pregnancy.  Today she reports no complaints. Contractions: Not present. Vag. Bleeding: None.  Movement: Absent. denies leaking of fluid. Review of Systems:   Pertinent items are noted in HPI Denies abnormal vaginal discharge w/ itching/odor/irritation, headaches, visual changes, shortness of breath, chest pain, abdominal pain, severe nausea/vomiting, or problems with urination or bowel movements unless otherwise stated above. Pertinent History Reviewed:  Reviewed past medical,surgical, social, obstetrical and family history.  Reviewed problem list, medications and allergies. Physical Assessment:   Vitals:   05/18/20 0907  BP: 111/76  Pulse: 86  Weight: 196 lb (88.9 kg)  Body mass index is 31.64 kg/m.        Physical Examination:   General appearance: Well appearing, and in no distress  Mental status: Alert, oriented to person, place, and time  Skin: Warm & dry  Cardiovascular: Normal heart rate noted  Respiratory: Normal respiratory effort, no distress  Abdomen: Soft, gravid, nontender  Pelvic: Cervical exam deferred         Extremities: Edema: None  Fetal Status:     Movement: Absent    Chaperone: n/a    Results for orders placed or performed in visit on 05/18/20 (from the past 24 hour(s))  POC Urinalysis Dipstick OB   Collection Time: 05/18/20  9:12 AM  Result Value Ref Range   Color, UA     Clarity, UA     Glucose, UA Negative Negative   Bilirubin, UA     Ketones, UA neg    Spec Grav, UA     Blood, UA neg    pH, UA     POC,PROTEIN,UA Negative Negative, Trace, Small (1+), Moderate (2+), Large (3+), 4+   Urobilinogen, UA     Nitrite, UA neg     Leukocytes, UA Negative Negative   Appearance     Odor      Assessment & Plan:  1) Low-risk pregnancy G1P0 at [redacted]w[redacted]d with an Estimated Date of Delivery: 09/15/20   2) increased weight gain s/p gastric bypass, weight warning given.  Watch carbs, no sugar in drinks.  Only need 300 extra calories a day.     Meds: No orders of the defined types were placed in this encounter.  Labs/procedures today: none  Plan:  Continue routine obstetrical care  Next visit: prefers in person    Reviewed: Preterm labor symptoms and general obstetric precautions including but not limited to vaginal bleeding, contractions, leaking of fluid and fetal movement were reviewed in detail with the patient.  All questions were answered. Has home bp cuff. Check bp weekly, let us know if >140/90.   Follow-up: Return for PN2/LROB.  Orders Placed This Encounter  Procedures  . POC Urinalysis Dipstick OB   Jacklyn Shell DNP, CNM 05/18/2020 9:30 AM

## 2020-05-18 NOTE — Patient Instructions (Signed)

## 2020-06-15 ENCOUNTER — Ambulatory Visit (INDEPENDENT_AMBULATORY_CARE_PROVIDER_SITE_OTHER): Payer: Medicaid Other | Admitting: Advanced Practice Midwife

## 2020-06-15 ENCOUNTER — Other Ambulatory Visit: Payer: Medicaid Other

## 2020-06-15 ENCOUNTER — Encounter: Payer: Self-pay | Admitting: Advanced Practice Midwife

## 2020-06-15 ENCOUNTER — Other Ambulatory Visit: Payer: Self-pay

## 2020-06-15 VITALS — BP 119/79 | HR 97 | Wt 196.6 lb

## 2020-06-15 DIAGNOSIS — Z3A26 26 weeks gestation of pregnancy: Secondary | ICD-10-CM

## 2020-06-15 DIAGNOSIS — Z3482 Encounter for supervision of other normal pregnancy, second trimester: Secondary | ICD-10-CM

## 2020-06-15 DIAGNOSIS — Z3402 Encounter for supervision of normal first pregnancy, second trimester: Secondary | ICD-10-CM

## 2020-06-15 NOTE — Patient Instructions (Addendum)
COVID-19 Vaccine Information can be found at: [email or web address] For questions related to vaccine distribution or appointments, please email [email or web address] or call [phone].   Meet the Provider Zoom Sessions      [facility] is now offering FREE monthly 1-hour virtual Zoom sessions for new, current, and prospective patients.        During these sessions, you can:   Learn about our practice, model of care, services   Get answers to questions about pregnancy and birth during COVID   Pick your providers brain about anything else!    Sessions will be hosted by [employer] for [employer], Producer, television/film/video, Physicians and Midwives          No registration required      2021 Dates:      All at 6pm     October 21st     November 18th   December 16th     January 20th  February 17th    To join one of these meetings, a few minutes before it is set to start:     Copy/paste the link into your web browser:  [email or web address]    OR  Scan the QR code below (open up your camera and point towards QR code; click on tab that pops up on your phone ("zoom")        [place] Pediatricians/Family Doctors:  [facility] [phone]            [facility] [phone]                 [facility] [phone] (usually not accepting new patients unless you have family there already, you are always welcome to call and ask)       [facility] Department [phone]       [facility] Pediatricians/Family Doctors:   [facility]: [phone]  [facility]: [phone]  [facility]: [phone]  [facility] Doctors:   [facility]: [phone]   [facility]:  [phone]  [facility] Family Doctors:  [facility]: [phone]   Contraception Choices Contraception, also called birth control, refers to methods or devices that prevent pregnancy. Hormonal methods Contraceptive implant  A contraceptive implant is a thin, plastic tube that contains a hormone. It is inserted into the upper part of the arm. It can remain in place for up to 3 years. Progestin-only injections Progestin-only injections are injections of progestin, a synthetic form of the hormone progesterone. They are given every 3 months by a health care provider. Birth control pills  Birth control pills are pills that contain hormones that prevent pregnancy. They must be taken once a day, preferably at the same time each day. Birth control patch  The birth control patch contains hormones that prevent pregnancy. It is placed on the skin and must be changed once a week for three weeks and removed on the fourth week. A prescription is needed to use this method of contraception. Vaginal ring  A vaginal ring contains hormones that prevent pregnancy. It is placed in the vagina for three weeks and removed on the fourth week. After that, the process is repeated with a new ring. A prescription is needed to use this method of contraception. Emergency contraceptive Emergency contraceptives prevent pregnancy after unprotected sex. They come in pill form and can be taken up to 5 days after sex. They work best the sooner they are taken after having sex. Most emergency contraceptives are available  without a prescription. This method should not be used as your only form of birth control. Barrier methods Female condom  A female condom is a thin sheath that is worn over the penis during sex. Condoms keep sperm from going inside a woman's body. They can be used with a spermicide to increase their effectiveness. They should be disposed after a single use. Female condom  A female condom is a  soft, loose-fitting sheath that is put into the vagina before sex. The condom keeps sperm from going inside a woman's body. They should be disposed after a single use. Diaphragm  A diaphragm is a soft, dome-shaped barrier. It is inserted into the vagina before sex, along with a spermicide. The diaphragm blocks sperm from entering the uterus, and the spermicide kills sperm. A diaphragm should be left in the vagina for 6-8 hours after sex and removed within 24 hours. A diaphragm is prescribed and fitted by a health care provider. A diaphragm should be replaced every 1-2 years, after giving birth, after gaining more than 15 lb (6.8 kg), and after pelvic surgery. Cervical cap  A cervical cap is a round, soft latex or plastic cup that fits over the cervix. It is inserted into the vagina before sex, along with spermicide. It blocks sperm from entering the uterus. The cap should be left in place for 6-8 hours after sex and removed within 48 hours. A cervical cap must be prescribed and fitted by a health care provider. It should be replaced every 2 years. Sponge  A sponge is a soft, circular piece of polyurethane foam with spermicide on it. The sponge helps block sperm from entering the uterus, and the spermicide kills sperm. To use it, you make it wet and then insert it into the vagina. It should be inserted before sex, left in for at least 6 hours after sex, and removed and thrown away within 30 hours. Spermicides Spermicides are chemicals that kill or block sperm from entering the cervix and uterus. They can come as a cream, jelly, suppository, foam, or tablet. A spermicide should be inserted into the vagina with an applicator at least 10-15 minutes before sex to allow time for it to work. The process must be repeated every time you have sex. Spermicides do not require a prescription. Intrauterine contraception Intrauterine device (IUD) An IUD is a T-shaped device that is put in a woman's uterus. There are  two types:  Hormone IUD.This type contains progestin, a synthetic form of the hormone progesterone. This type can stay in place for 3-5 years.  Copper IUD.This type is wrapped in copper wire. It can stay in place for 10 years.  Permanent methods of contraception Female tubal ligation In this method, a woman's fallopian tubes are sealed, tied, or blocked during surgery to prevent eggs from traveling to the uterus. Hysteroscopic sterilization In this method, a small, flexible insert is placed into each fallopian tube. The inserts cause scar tissue to form in the fallopian tubes and block them, so sperm cannot reach an egg. The procedure takes about 3 months to be effective. Another form of birth control must be used during those 3 months. Female sterilization This is a procedure to tie off the tubes that carry sperm (vasectomy). After the procedure, the man can still ejaculate fluid (semen). Natural planning methods Natural family planning In this method, a couple does not have sex on days when the woman could become pregnant. Calendar method This means keeping track of the length  of each menstrual cycle, identifying the days when pregnancy can happen, and not having sex on those days. Ovulation method In this method, a couple avoids sex during ovulation. Symptothermal method This method involves not having sex during ovulation. The woman typically checks for ovulation by watching changes in her temperature and in the consistency of cervical mucus. Post-ovulation method In this method, a couple waits to have sex until after ovulation. Summary  Contraception, also called birth control, means methods or devices that prevent pregnancy.  Hormonal methods of contraception include implants, injections, pills, patches, vaginal rings, and emergency contraceptives.  Barrier methods of contraception can include female condoms, female condoms, diaphragms, cervical caps, sponges, and  spermicides.  There are two types of IUDs (intrauterine devices). An IUD can be put in a woman's uterus to prevent pregnancy for 3-5 years.  Permanent sterilization can be done through a procedure for males, females, or both.  Natural family planning methods involve not having sex on days when the woman could become pregnant. This information is not intended to replace advice given to you by your health care provider. Make sure you discuss any questions you have with your health care provider. Document Revised: 08/14/2017 Document Reviewed: 09/14/2016 Elsevier Patient Education  2020 [employer].

## 2020-06-15 NOTE — Progress Notes (Signed)
   LOW-RISK PREGNANCY VISIT Patient name: Kaitlin Klein MRN [id number]  Date of birth: 08-Mar-1996 Chief Complaint:   Routine Prenatal Visit (PN2)  History of Present Illness:   Kaitlin Klein is a 24 y.o. G1P0 female at [redacted]w[redacted]d with an Estimated Date of Delivery: 09/15/20 being seen today for ongoing management of a low-risk pregnancy.  Today she reports no complaints. Contractions: Not present.  .  Movement: Present. denies leaking of fluid. Review of Systems:   Pertinent items are noted in HPI Denies abnormal vaginal discharge w/ itching/odor/irritation, headaches, visual changes, shortness of breath, chest pain, abdominal pain, severe nausea/vomiting, or problems with urination or bowel movements unless otherwise stated above. Pertinent History Reviewed:  Reviewed past medical,surgical, social, obstetrical and family history.  Reviewed problem list, medications and allergies. Physical Assessment:   Vitals:   06/15/20 0951  BP: 119/79  Pulse: 97  Weight: 196 lb 9.6 oz (89.2 kg)  Body mass index is 31.73 kg/m.        Physical Examination:   General appearance: Well appearing, and in no distress  Mental status: Alert, oriented to person, place, and time  Skin: Warm & dry  Cardiovascular: Normal heart rate noted  Respiratory: Normal respiratory effort, no distress  Abdomen: Soft, gravid, nontender  Pelvic: Cervical exam deferred         Extremities: Edema: None  Fetal Status:     Movement: Present    Chaperone: n/a    No results found for this or any previous visit (from the past 24 hour(s)).  Assessment & Plan:  1) Low-risk pregnancy G1P0 at [redacted]w[redacted]d with an Estimated Date of Delivery: 09/15/20   2) Covid vaccine counseling, still not sure   Meds: No orders of the defined types were placed in this encounter.  Labs/procedures today: PN  Plan:  Continue routine obstetrical care  Next visit: prefers in person    Reviewed: Preterm labor symptoms and general obstetric  precautions including but not limited to vaginal bleeding, contractions, leaking of fluid and fetal movement were reviewed in detail with the patient.  All questions were answered. Has home bp cuff.. Check bp weekly, let us know if >140/90.   Follow-up: Return in about 3 weeks (around 07/06/2020) for LROB.  No orders of the defined types were placed in this encounter.  Jacklyn Shell DNP, CNM 06/15/2020 10:33 AM

## 2020-06-16 LAB — CBC
Hematocrit: 38.1 % (ref 34.0–46.6)
Hemoglobin: 13 g/dL (ref 11.1–15.9)
MCH: 30.5 pg (ref 26.6–33.0)
MCHC: 34.1 g/dL (ref 31.5–35.7)
MCV: 89 fL (ref 79–97)
Platelets: 230 10*3/uL (ref 150–450)
RBC: 4.26 x10E6/uL (ref 3.77–5.28)
RDW: 12.5 % (ref 11.7–15.4)
WBC: 13.8 10*3/uL — ABNORMAL HIGH (ref 3.4–10.8)

## 2020-06-16 LAB — ANTIBODY SCREEN: Antibody Screen: NEGATIVE

## 2020-06-16 LAB — GLUCOSE TOLERANCE, 2 HOURS W/ 1HR
Glucose, 1 hour: 202 mg/dL — ABNORMAL HIGH (ref 65–179)
Glucose, 2 hour: 117 mg/dL (ref 65–152)
Glucose, Fasting: 74 mg/dL (ref 65–91)

## 2020-06-16 LAB — HIV ANTIBODY (ROUTINE TESTING W REFLEX): HIV Screen 4th Generation wRfx: NONREACTIVE

## 2020-06-16 LAB — RPR: RPR Ser Ql: NONREACTIVE

## 2020-06-19 ENCOUNTER — Other Ambulatory Visit: Payer: Self-pay | Admitting: *Deleted

## 2020-06-19 ENCOUNTER — Encounter: Payer: Self-pay | Admitting: Women's Health

## 2020-06-19 DIAGNOSIS — Z8632 Personal history of gestational diabetes: Secondary | ICD-10-CM | POA: Insufficient documentation

## 2020-06-19 DIAGNOSIS — O0990 Supervision of high risk pregnancy, unspecified, unspecified trimester: Secondary | ICD-10-CM

## 2020-06-19 DIAGNOSIS — O24410 Gestational diabetes mellitus in pregnancy, diet controlled: Secondary | ICD-10-CM

## 2020-06-19 MED ORDER — ACCU-CHEK GUIDE ME W/DEVICE KIT: 1.0000 | PACK | Freq: Four times a day (QID) | 0 refills | Status: DC

## 2020-06-19 MED ORDER — ACCU-CHEK GUIDE VI STRP: ORAL_STRIP | 12 refills | Status: DC

## 2020-06-19 MED ORDER — ACCU-CHEK SOFTCLIX LANCETS MISC: 12 refills | Status: DC

## 2020-06-20 ENCOUNTER — Encounter: Payer: Self-pay | Admitting: *Deleted

## 2020-07-05 ENCOUNTER — Encounter: Payer: Medicaid Other | Attending: Advanced Practice Midwife | Admitting: Registered"

## 2020-07-05 ENCOUNTER — Other Ambulatory Visit: Payer: Self-pay

## 2020-07-05 DIAGNOSIS — O24410 Gestational diabetes mellitus in pregnancy, diet controlled: Secondary | ICD-10-CM | POA: Insufficient documentation

## 2020-07-06 ENCOUNTER — Encounter: Payer: Self-pay | Admitting: Advanced Practice Midwife

## 2020-07-06 ENCOUNTER — Ambulatory Visit (INDEPENDENT_AMBULATORY_CARE_PROVIDER_SITE_OTHER): Payer: Medicaid Other | Admitting: Advanced Practice Midwife

## 2020-07-06 VITALS — BP 124/82 | HR 91 | Wt 198.5 lb

## 2020-07-06 DIAGNOSIS — Z1389 Encounter for screening for other disorder: Secondary | ICD-10-CM

## 2020-07-06 DIAGNOSIS — Z331 Pregnant state, incidental: Secondary | ICD-10-CM

## 2020-07-06 DIAGNOSIS — O24410 Gestational diabetes mellitus in pregnancy, diet controlled: Secondary | ICD-10-CM | POA: Diagnosis not present

## 2020-07-06 DIAGNOSIS — O0990 Supervision of high risk pregnancy, unspecified, unspecified trimester: Secondary | ICD-10-CM

## 2020-07-06 DIAGNOSIS — Z23 Encounter for immunization: Secondary | ICD-10-CM

## 2020-07-06 DIAGNOSIS — Z3A29 29 weeks gestation of pregnancy: Secondary | ICD-10-CM

## 2020-07-06 LAB — POCT URINALYSIS DIPSTICK OB
Blood, UA: NEGATIVE
Ketones, UA: NEGATIVE
Leukocytes, UA: NEGATIVE
Nitrite, UA: NEGATIVE
POC,PROTEIN,UA: NEGATIVE

## 2020-07-06 LAB — GLUCOSE, POCT (MANUAL RESULT ENTRY): POC Glucose: 69 mg/dl — AB (ref 70–99)

## 2020-07-06 NOTE — Progress Notes (Signed)
   HIGH-RISK PREGNANCY VISIT Patient name: Kaitlin Klein MRN [id number]  Date of birth: [birth year] Chief Complaint:   High Risk Gestation  History of Present Illness:   Kaitlin Klein is a 24 y.o. G1P0 female at [redacted]w[redacted]d with an Estimated Date of Delivery: 09/15/20 being seen today for ongoing management of a high-risk pregnancy complicated by A1DM Today she reports no complaints. Contractions: Not present. Vag. Bleeding: None.  Movement: Present. denies leaking of fluid.  Review of Systems:   Pertinent items are noted in HPI Denies abnormal vaginal discharge w/ itching/odor/irritation, headaches, visual changes, shortness of breath, chest pain, abdominal pain, severe nausea/vomiting, or problems with urination or bowel movements unless otherwise stated above. Pertinent History Reviewed:  Reviewed past medical,surgical, social, obstetrical and family history.  Reviewed problem list, medications and allergies. Physical Assessment:   Vitals:   07/06/20 1032  BP: 124/82  Pulse: 91  Weight: 198 lb 8 oz (90 kg)  Body mass index is 32.04 kg/m.           Physical Examination:   General appearance: alert, well appearing, and in no distress  Mental status: alert, oriented to person, place, and time  Skin: warm & dry   Extremities: Edema: None    Cardiovascular: normal heart rate noted  Respiratory: normal respiratory effort, no distress  Abdomen: gravid, soft, non-tender  Pelvic: Cervical exam deferred         Fetal Status:     Movement: Present    Fetal Surveillance Testing today: doppler   Results for orders placed or performed in visit on 07/06/20 (from the past 24 hour(s))  POC Urinalysis Dipstick OB   Collection Time: 07/06/20 10:33 AM  Result Value Ref Range   Color, UA     Clarity, UA     Glucose, UA Large (3+) (A) Negative   Bilirubin, UA     Ketones, UA neg    Spec Grav, UA     Blood, UA neg    pH, UA     POC,PROTEIN,UA Negative Negative, Trace, Small (1+), Moderate  (2+), Large (3+), 4+   Urobilinogen, UA     Nitrite, UA neg    Leukocytes, UA Negative Negative   Appearance     Odor    POCT glucose (manual entry)   Collection Time: 07/06/20 11:03 AM  Result Value Ref Range   POC Glucose 69 (A) 70 - 99 mg/dl    Assessment & Plan:  1) High-risk pregnancy G1P0 at [redacted]w[redacted]d with an Estimated Date of Delivery: 09/15/20   2) A1DM, just had class yesterday Treatment plan  Start QID blood sugar testing, EFW 36-38 weeks, IOL 40 weeks   Meds: No orders of the defined types were placed in this encounter.   Labs/procedures today: CBG; Tdap/Flu vaccine   Reviewed: Preterm labor symptoms and general obstetric precautions including but not limited to vaginal bleeding, contractions, leaking of fluid and fetal movement were reviewed in detail with the patient.  All questions were answered. Has home bp cuff. Check bp weekly, let us know if >140/90.   Follow-up: No follow-ups on file.  No future appointments.  Orders Placed This Encounter  Procedures  . POC Urinalysis Dipstick OB  . POCT glucose (manual entry)   Jacklyn Shell DNP, CNM 07/06/2020 11:09 AM

## 2020-07-06 NOTE — Patient Instructions (Signed)
Meet the Provider Zoom Sessions      [facility] is now offering FREE monthly 1-hour virtual Zoom sessions for new, current, and prospective patients.        During these sessions, you can:   Learn about our practice, model of care, services   Get answers to questions about pregnancy and birth during COVID   Pick your provider's brain about anything else!    Sessions will be hosted by [facility] Nurse Practitioners, Physician Assistants, Physicians and Midwives          No registration required      2021 Dates:      All at 6pm     October 21st     November 18th   December 16th     January 20th  February 17th    To join one of these meetings, a few minutes before it is set to start:     Copy/paste the link into your web browser:  [email or web address]    OR  Scan the QR code below (open up your camera and point towards QR code; click on tab that pops up on your phone ("zoom")     

## 2020-07-07 ENCOUNTER — Encounter: Payer: Self-pay | Admitting: Registered"

## 2020-07-07 NOTE — Progress Notes (Signed)
Patient was seen on 07/05/20 for Gestational Diabetes self-management class at [facility]. The following learning objectives were met by the patient during this course:   States the definition of Gestational Diabetes  States why dietary management is important in controlling blood glucose  Describes the effects each nutrient has on blood glucose levels  Demonstrates ability to create a balanced meal plan  Demonstrates carbohydrate counting   States when to check blood glucose levels  Demonstrates proper blood glucose monitoring techniques  States the effect of stress and exercise on blood glucose levels  States the importance of limiting caffeine and abstaining from alcohol and smoking  Blood glucose monitor given: none, patient is checking CBGs prior to class  Patient instructed to monitor glucose levels: FBS: 60 - <95; 1 hour: <140; 2 hour: <120  Patient received handouts:  Nutrition Diabetes and Pregnancy, including carb counting list  Patient will be seen for follow-up as needed.

## 2020-07-24 ENCOUNTER — Encounter: Payer: Self-pay | Admitting: Obstetrics & Gynecology

## 2020-07-24 ENCOUNTER — Other Ambulatory Visit: Payer: Self-pay

## 2020-07-24 ENCOUNTER — Ambulatory Visit (INDEPENDENT_AMBULATORY_CARE_PROVIDER_SITE_OTHER): Payer: Medicaid Other | Admitting: Obstetrics & Gynecology

## 2020-07-24 VITALS — BP 139/82 | HR 102 | Wt 204.0 lb

## 2020-07-24 DIAGNOSIS — Z3A32 32 weeks gestation of pregnancy: Secondary | ICD-10-CM

## 2020-07-24 DIAGNOSIS — Z1389 Encounter for screening for other disorder: Secondary | ICD-10-CM | POA: Diagnosis not present

## 2020-07-24 DIAGNOSIS — O0990 Supervision of high risk pregnancy, unspecified, unspecified trimester: Secondary | ICD-10-CM

## 2020-07-24 DIAGNOSIS — Z331 Pregnant state, incidental: Secondary | ICD-10-CM | POA: Diagnosis not present

## 2020-07-24 DIAGNOSIS — O24410 Gestational diabetes mellitus in pregnancy, diet controlled: Secondary | ICD-10-CM

## 2020-07-24 LAB — POCT URINALYSIS DIPSTICK OB
Blood, UA: NEGATIVE
Glucose, UA: NEGATIVE
Ketones, UA: NEGATIVE
Leukocytes, UA: NEGATIVE
Nitrite, UA: NEGATIVE
POC,PROTEIN,UA: NEGATIVE

## 2020-07-24 NOTE — Progress Notes (Signed)
Patient ID: Kaitlin Klein, female   DOB: [birth year], 24 y.o.   MRN: [id number]   [facility] PREGNANCY VISIT Patient name: Kaitlin Klein MRN [id number]  Date of birth: 1996-03-24 Chief Complaint:   Routine Prenatal Visit  History of Present Illness:   Kaitlin Klein is a 24 y.o. G1P0 female at [redacted]w[redacted]d with an Estimated Date of Delivery: 09/15/20 being seen today for ongoing management of a high-risk pregnancy complicated by diabetes mellitus A1DM.  Today she reports no complaints.  Depression screen [facility] 2/9 07/07/2020 06/15/2020 04/20/2020 03/09/2020 01/13/2020  Decreased Interest 0 3 2 2  0  Down, Depressed, Hopeless 0 3 3 3 2   PHQ - 2 Score 0 6 5 5 2   Altered sleeping - 2 2 2  0  Tired, decreased energy - 3 3 2 1   Change in appetite - 0 3 3 0  Feeling bad or failure about yourself  - 0 3 2 0  Trouble concentrating - 0 0 0 0  Moving slowly or fidgety/restless - 0 0 0 0  Suicidal thoughts - 0 0 1 0  PHQ-9 Score - 11 16 15 3   Difficult doing work/chores - Not difficult at all Very difficult Very difficult -    Contractions: Irritability. Vag. Bleeding: None.  Movement: Present. denies leaking of fluid.  Review of Systems:   Pertinent items are noted in HPI Denies abnormal vaginal discharge w/ itching/odor/irritation, headaches, visual changes, shortness of breath, chest pain, abdominal pain, severe nausea/vomiting, or problems with urination or bowel movements unless otherwise stated above. Pertinent History Reviewed:  Reviewed past medical,surgical, social, obstetrical and family history.  Reviewed problem list, medications and allergies. Physical Assessment:   Vitals:   07/24/20 1044  BP: 139/82  Pulse: (!) 102  Weight: 204 lb (92.5 kg)  Body mass index is 32.93 kg/m.           Physical Examination:   General appearance: alert, well appearing, and in no distress  Mental status: alert, oriented to person, place, and time  Skin: warm & dry   Extremities: Edema: None      Cardiovascular: normal heart rate noted  Respiratory: normal respiratory effort, no distress  Abdomen: gravid, soft, non-tender  Pelvic: Cervical exam deferred         Fetal Status: Fetal Heart Rate (bpm): 153 Fundal Height: 30 cm Movement: Present    Fetal Surveillance Testing today:    Chaperone: N/A    Results for orders placed or performed in visit on 07/24/20 (from the past 24 hour(s))  POC Urinalysis Dipstick OB   Collection Time: 07/24/20 10:42 AM  Result Value Ref Range   Color, UA     Clarity, UA     Glucose, UA Negative Negative   Bilirubin, UA     Ketones, UA neg    Spec Grav, UA     Blood, UA neg    pH, UA     POC,PROTEIN,UA Negative Negative, Trace, Small (1+), Moderate (2+), Large (3+), 4+   Urobilinogen, UA     Nitrite, UA neg    Leukocytes, UA Negative Negative   Appearance     Odor      Assessment & Plan:  High-risk pregnancy: G1P0 at [redacted]w[redacted]d with an Estimated Date of Delivery: 09/15/20   1) Class A1 DM, did not bring in her CBG but states her fasings in the 90s and 2 hours 130-140s which is not adequate control    Meds: No orders of the defined types were  placed in this encounter.   Labs/procedures today:   Treatment Plan:  2 weeks bring sugars, meds if needed, pt aware  Reviewed: Preterm labor symptoms and general obstetric precautions including but not limited to vaginal bleeding, contractions, leaking of fluid and fetal movement were reviewed in detail with the patient.  All questions were answered.  home bp cuff. Rx faxed to . Check bp weekly, let us know if >140/90.   Follow-up: Return in about 2 weeks (around 08/07/2020) for HROB, with Dr Despina Hidden.   No future appointments.  Orders Placed This Encounter  Procedures  . POC Urinalysis Dipstick OB   Lazaro Arms 07/24/2020 10:58 AM

## 2020-08-07 ENCOUNTER — Ambulatory Visit (INDEPENDENT_AMBULATORY_CARE_PROVIDER_SITE_OTHER): Payer: Medicaid Other | Admitting: Obstetrics & Gynecology

## 2020-08-07 ENCOUNTER — Other Ambulatory Visit: Payer: Self-pay

## 2020-08-07 ENCOUNTER — Encounter: Payer: Self-pay | Admitting: Obstetrics & Gynecology

## 2020-08-07 VITALS — BP 123/79 | HR 103 | Wt 208.4 lb

## 2020-08-07 DIAGNOSIS — O24410 Gestational diabetes mellitus in pregnancy, diet controlled: Secondary | ICD-10-CM

## 2020-08-07 DIAGNOSIS — Z1389 Encounter for screening for other disorder: Secondary | ICD-10-CM

## 2020-08-07 DIAGNOSIS — Z331 Pregnant state, incidental: Secondary | ICD-10-CM

## 2020-08-07 DIAGNOSIS — O0990 Supervision of high risk pregnancy, unspecified, unspecified trimester: Secondary | ICD-10-CM

## 2020-08-07 LAB — POCT URINALYSIS DIPSTICK OB
Blood, UA: NEGATIVE
Glucose, UA: NEGATIVE
Ketones, UA: NEGATIVE
Leukocytes, UA: NEGATIVE
Nitrite, UA: NEGATIVE
POC,PROTEIN,UA: NEGATIVE

## 2020-08-07 NOTE — Progress Notes (Signed)
HIGH-RISK PREGNANCY VISIT Patient name: Kaitlin Klein MRN [id number]  Date of birth: [birth year] Chief Complaint:   Routine Prenatal Visit  History of Present Illness:   Kaitlin Klein is a 24 y.o. G1P0 female at [redacted]w[redacted]d with an Estimated Date of Delivery: 09/15/20 being seen today for ongoing management of a high-risk pregnancy complicated by diabetes mellitus A1DM.  Today she reports no complaints.  Depression screen [facility] 2/9 07/07/2020 06/15/2020 04/20/2020 03/09/2020 01/13/2020  Decreased Interest 0 3 2 2  0  Down, Depressed, Hopeless 0 3 3 3 2   PHQ - 2 Score 0 6 5 5 2   Altered sleeping - 2 2 2  0  Tired, decreased energy - 3 3 2 1   Change in appetite - 0 3 3 0  Feeling bad or failure about yourself  - 0 3 2 0  Trouble concentrating - 0 0 0 0  Moving slowly or fidgety/restless - 0 0 0 0  Suicidal thoughts - 0 0 1 0  PHQ-9 Score - 11 16 15 3   Difficult doing work/chores - Not difficult at all Very difficult Very difficult -    Contractions: Irregular.  .  Movement: Present. denies leaking of fluid.  Review of Systems:   Pertinent items are noted in HPI Denies abnormal vaginal discharge w/ itching/odor/irritation, headaches, visual changes, shortness of breath, chest pain, abdominal pain, severe nausea/vomiting, or problems with urination or bowel movements unless otherwise stated above. Pertinent History Reviewed:  Reviewed past medical,surgical, social, obstetrical and family history.  Reviewed problem list, medications and allergies. Physical Assessment:   Vitals:   08/07/20 1029  BP: 123/79  Pulse: (!) 103  Weight: 208 lb 6.4 oz (94.5 kg)  Body mass index is 33.64 kg/m.           Physical Examination:   General appearance: alert, well appearing, and in no distress  Mental status: alert, oriented to person, place, and time  Skin: warm & dry   Extremities: Edema: None    Cardiovascular: normal heart rate noted  Respiratory: normal respiratory effort, no  distress  Abdomen: gravid, soft, non-tender  Pelvic: Cervical exam deferred         Fetal Status: Fetal Heart Rate (bpm): 147 Fundal Height: 35 cm Movement: Present    Fetal Surveillance Testing today: FHR 140s   Chaperone: N/A    Results for orders placed or performed in visit on 08/07/20 (from the past 24 hour(s))  POC Urinalysis Dipstick OB   Collection Time: 08/07/20 10:41 AM  Result Value Ref Range   Color, UA     Clarity, UA     Glucose, UA Negative Negative   Bilirubin, UA     Ketones, UA neg    Spec Grav, UA     Blood, UA neg    pH, UA     POC,PROTEIN,UA Negative Negative, Trace, Small (1+), Moderate (2+), Large (3+), 4+   Urobilinogen, UA     Nitrite, UA neg    Leukocytes, UA Negative Negative   Appearance     Odor      Assessment & Plan:  High-risk pregnancy: G1P0 at [redacted]w[redacted]d with an Estimated Date of Delivery: 09/15/20   1) A1DM, stable, fasting CBG not ideal, if they remain >95 fasting consistently will add qhs glyburide 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   Labs/procedures today:   Treatment Plan:  EFW sonogram next visit  Reviewed:  Preterm labor symptoms and general obstetric precautions including but not limited to vaginal bleeding, contractions, leaking of fluid and fetal movement were reviewed in detail with the patient.  All questions were answered. Has home bp cuff. Rx faxed to . Check bp weekly, let us know if >140/90.   Follow-up: No follow-ups on file.   No future appointments.  Orders Placed This Encounter  Procedures  . US OB Follow Up  . POC Urinalysis Dipstick OB   Kaitlin Klein  08/07/2020 11:21 AM

## 2020-08-09 NOTE — Progress Notes (Signed)
Progress Notes by Kizzie Bane, RN at 08/09/20 1355                Author: Kizzie Bane, RN  Service: --  Author Type: Navigator       Filed: 08/09/20 1355  Encounter Date: 08/09/2020  Status: Signed          Editor: Kizzie Bane, RN (Navigator)               Per [facility] requirements;   E-mail sent for follow up appointment.      [facility]   [address] [place] [address]                        [facility]   [place]  [facility]   [facility]         Dear Patient,      Your health is our main concern. It is important for your health to have follow-up lab work and to see your surgeon at 2 months, 4 months, 6 months, 9 months and annually after your weight loss surgery.           Additionally, the Department of Bariatric Surgery at our hospital is a member of the Metabolic and Bariatric Surgery Accreditation and Quality Improvement Program [facility]).   As a participant in this program, we gather information on the outcomes of  our patients after surgery.   Please call the office for a follow up appointment at [phone].   If you have moved out of the area or have changed surgeons please call us and let us know the name of your doctor.      Your health and feedback are important to [place].  We greatly appreciate your response.          Thank you,   [facility]   [employer]

## 2020-08-15 NOTE — Telephone Encounter (Signed)
Per MBSAQIP requirements;  Phone call placed. Unable to leave a message.  Service restricted.

## 2020-08-26 NOTE — L&D Delivery Note (Signed)
Delivery Note Called to room and patient complete and pushing. At 4:24 PM a viable female was delivered via Vaginal, Spontaneous (Presentation:   Occiput Anterior).  APGAR: 9,9; weight 3079 g.   Placenta status: Spontaneous, Intact.  Cord: 3 vessels with the following complications: None. No nuchal cord noted on exam.  Anesthesia: Epidural Episiotomy: None Lacerations: None Est. Blood Loss (mL): 40  Mom to postpartum.  Baby to Couplet care / Skin to Skin.  Alric Seton 09/07/2020, 4:41 PM

## 2020-08-28 ENCOUNTER — Ambulatory Visit (INDEPENDENT_AMBULATORY_CARE_PROVIDER_SITE_OTHER): Payer: Medicaid Other

## 2020-08-28 ENCOUNTER — Other Ambulatory Visit: Payer: Self-pay

## 2020-08-28 ENCOUNTER — Ambulatory Visit (INDEPENDENT_AMBULATORY_CARE_PROVIDER_SITE_OTHER): Payer: Medicaid Other | Admitting: Obstetrics & Gynecology

## 2020-08-28 VITALS — BP 121/79 | HR 86 | Wt 210.0 lb

## 2020-08-28 DIAGNOSIS — Z331 Pregnant state, incidental: Secondary | ICD-10-CM

## 2020-08-28 DIAGNOSIS — O0990 Supervision of high risk pregnancy, unspecified, unspecified trimester: Secondary | ICD-10-CM

## 2020-08-28 DIAGNOSIS — Z3A37 37 weeks gestation of pregnancy: Secondary | ICD-10-CM

## 2020-08-28 DIAGNOSIS — Z1389 Encounter for screening for other disorder: Secondary | ICD-10-CM

## 2020-08-28 LAB — POCT URINALYSIS DIPSTICK OB
Blood, UA: NEGATIVE
Glucose, UA: NEGATIVE
Ketones, UA: NEGATIVE
Leukocytes, UA: NEGATIVE
Nitrite, UA: NEGATIVE
POC,PROTEIN,UA: NEGATIVE

## 2020-08-28 NOTE — Progress Notes (Signed)
[place] 37+3 wks,cephalic,fhr 142 bpm,AFI 19 cm,EFW 3025 g 41%,anterior placenta gr 2

## 2020-08-28 NOTE — Progress Notes (Signed)
Patient ID: Kaitlin Klein, female   DOB: 1996/07/02, 26 y.o.   MRN: [id number]   [facility] PREGNANCY VISIT Patient name: Kaitlin Klein MRN [id number]  Date of birth: 1995-09-22 Chief Complaint:   Routine Prenatal Visit  History of Present Illness:   Kaitlin Klein is a 25 y.o. G1P0 female at [redacted]w[redacted]d with an Estimated Date of Delivery: 09/15/20 being seen today for ongoing management of a high-risk pregnancy complicated by diabetes mellitus A1DM.  Today she reports no complaints.  Depression screen [facility] 2/9 07/07/2020 06/15/2020 04/20/2020 03/09/2020 01/13/2020  Decreased Interest 0 3 2 2  0  Down, Depressed, Hopeless 0 3 3 3 2   PHQ - 2 Score 0 6 5 5 2   Altered sleeping - 2 2 2  0  Tired, decreased energy - 3 3 2 1   Change in appetite - 0 3 3 0  Feeling bad or failure about yourself  - 0 3 2 0  Trouble concentrating - 0 0 0 0  Moving slowly or fidgety/restless - 0 0 0 0  Suicidal thoughts - 0 0 1 0  PHQ-9 Score - 11 16 15 3   Difficult doing work/chores - Not difficult at all Very difficult Very difficult -    Contractions: Irritability. Vag. Bleeding: None.  Movement: Present. denies leaking of fluid.  Review of Systems:   Pertinent items are noted in HPI Denies abnormal vaginal discharge w/ itching/odor/irritation, headaches, visual changes, shortness of breath, chest pain, abdominal pain, severe nausea/vomiting, or problems with urination or bowel movements unless otherwise stated above. Pertinent History Reviewed:  Reviewed past medical,surgical, social, obstetrical and family history.  Reviewed problem list, medications and allergies. Physical Assessment:   Vitals:   08/28/20 1117  BP: 121/79  Pulse: 86  Weight: 210 lb (95.3 kg)  Body mass index is 33.89 kg/m.           Physical Examination:   General appearance: alert, well appearing, and in no distress  Mental status: alert, oriented to person, place, and time  Skin: warm & dry   Extremities: Edema: None     Cardiovascular: normal heart rate noted  Respiratory: normal respiratory effort, no distress  Abdomen: gravid, soft, non-tender  Pelvic: Cervical exam performed  Dilation: 1 Effacement (%): 50 Station: -2  Fetal Status: Fetal Heart Rate (bpm): 142 Fundal Height: 37 cm Movement: Present Presentation: Vertex  Fetal Surveillance Testing today: sonogram   Chaperone: N/A    Results for orders placed or performed in visit on 08/28/20 (from the past 24 hour(s))  POC Urinalysis Dipstick OB   Collection Time: 08/28/20 11:17 AM  Result Value Ref Range   Color, UA     Clarity, UA     Glucose, UA Negative Negative   Bilirubin, UA     Ketones, UA neg    Spec Grav, UA     Blood, UA neg    pH, UA     POC,PROTEIN,UA Negative Negative, Trace, Small (1+), Moderate (2+), Large (3+), 4+   Urobilinogen, UA     Nitrite, UA neg    Leukocytes, UA Negative Negative   Appearance     Odor      Assessment & Plan:  High-risk pregnancy: G1P0 at [redacted]w[redacted]d with an Estimated Date of Delivery: 09/15/20   1) A1DM, stable, CBG are good, EFW 41%    Meds: No orders of the defined types were placed in this encounter.   Labs/procedures today: sonogram  Treatment Plan:  IOL 40 weeks  Reviewed: Term  labor symptoms and general obstetric precautions including but not limited to vaginal bleeding, contractions, leaking of fluid and fetal movement were reviewed in detail with the patient.  All questions were answered. Has home bp cuff. Rx faxed to . Check bp weekly, let us know if >140/90.   Follow-up: Return in about 1 week (around 09/04/2020) for HROB.   No future appointments.  Orders Placed This Encounter  Procedures  . POC Urinalysis Dipstick OB   Lazaro Arms  08/28/2020 11:47 AM

## 2020-09-06 ENCOUNTER — Encounter: Payer: Self-pay | Admitting: Advanced Practice Midwife

## 2020-09-06 ENCOUNTER — Encounter (HOSPITAL_COMMUNITY): Payer: Self-pay | Admitting: Obstetrics and Gynecology

## 2020-09-06 ENCOUNTER — Other Ambulatory Visit: Payer: Self-pay

## 2020-09-06 ENCOUNTER — Other Ambulatory Visit (HOSPITAL_COMMUNITY)
Admission: RE | Admit: 2020-09-06 | Discharge: 2020-09-06 | Disposition: A | Discharge status: Home / Self Care | Payer: Medicaid Other | Source: Ambulatory Visit | Attending: Advanced Practice Midwife | Admitting: Advanced Practice Midwife

## 2020-09-06 ENCOUNTER — Ambulatory Visit (INDEPENDENT_AMBULATORY_CARE_PROVIDER_SITE_OTHER): Payer: Medicaid Other | Admitting: Advanced Practice Midwife

## 2020-09-06 ENCOUNTER — Inpatient Hospital Stay (HOSPITAL_COMMUNITY)
Admission: AD | Admit: 2020-09-06 | Discharge: 2020-09-09 | DRG: 805 | Disposition: A | Discharge status: Home / Self Care | Payer: Medicaid Other | Attending: Family Medicine | Admitting: Family Medicine

## 2020-09-06 VITALS — BP 137/97 | HR 85 | Wt 209.0 lb

## 2020-09-06 DIAGNOSIS — O24420 Gestational diabetes mellitus in childbirth, diet controlled: Secondary | ICD-10-CM | POA: Diagnosis present

## 2020-09-06 DIAGNOSIS — Z331 Pregnant state, incidental: Secondary | ICD-10-CM

## 2020-09-06 DIAGNOSIS — Z3A38 38 weeks gestation of pregnancy: Secondary | ICD-10-CM

## 2020-09-06 DIAGNOSIS — O134 Gestational [pregnancy-induced] hypertension without significant proteinuria, complicating childbirth: Principal | ICD-10-CM | POA: Diagnosis present

## 2020-09-06 DIAGNOSIS — O0990 Supervision of high risk pregnancy, unspecified, unspecified trimester: Secondary | ICD-10-CM

## 2020-09-06 DIAGNOSIS — Z8632 Personal history of gestational diabetes: Secondary | ICD-10-CM | POA: Diagnosis present

## 2020-09-06 DIAGNOSIS — Z87891 Personal history of nicotine dependence: Secondary | ICD-10-CM | POA: Diagnosis not present

## 2020-09-06 DIAGNOSIS — O99844 Bariatric surgery status complicating childbirth: Secondary | ICD-10-CM | POA: Diagnosis present

## 2020-09-06 DIAGNOSIS — Z9884 Bariatric surgery status: Secondary | ICD-10-CM

## 2020-09-06 DIAGNOSIS — Z8759 Personal history of other complications of pregnancy, childbirth and the puerperium: Secondary | ICD-10-CM | POA: Diagnosis present

## 2020-09-06 DIAGNOSIS — U071 COVID-19: Secondary | ICD-10-CM | POA: Diagnosis present

## 2020-09-06 DIAGNOSIS — O99891 Other specified diseases and conditions complicating pregnancy: Secondary | ICD-10-CM

## 2020-09-06 DIAGNOSIS — O98513 Other viral diseases complicating pregnancy, third trimester: Secondary | ICD-10-CM | POA: Diagnosis present

## 2020-09-06 DIAGNOSIS — O9852 Other viral diseases complicating childbirth: Secondary | ICD-10-CM | POA: Diagnosis present

## 2020-09-06 DIAGNOSIS — R45851 Suicidal ideations: Secondary | ICD-10-CM

## 2020-09-06 DIAGNOSIS — O99824 Streptococcus B carrier state complicating childbirth: Secondary | ICD-10-CM | POA: Diagnosis present

## 2020-09-06 DIAGNOSIS — O139 Gestational [pregnancy-induced] hypertension without significant proteinuria, unspecified trimester: Secondary | ICD-10-CM | POA: Diagnosis present

## 2020-09-06 DIAGNOSIS — Z1389 Encounter for screening for other disorder: Secondary | ICD-10-CM

## 2020-09-06 DIAGNOSIS — F4321 Adjustment disorder with depressed mood: Secondary | ICD-10-CM | POA: Diagnosis present

## 2020-09-06 DIAGNOSIS — Z8659 Personal history of other mental and behavioral disorders: Secondary | ICD-10-CM

## 2020-09-06 DIAGNOSIS — R8271 Bacteriuria: Secondary | ICD-10-CM

## 2020-09-06 DIAGNOSIS — F332 Major depressive disorder, recurrent severe without psychotic features: Secondary | ICD-10-CM | POA: Diagnosis present

## 2020-09-06 DIAGNOSIS — O133 Gestational [pregnancy-induced] hypertension without significant proteinuria, third trimester: Secondary | ICD-10-CM

## 2020-09-06 DIAGNOSIS — O24410 Gestational diabetes mellitus in pregnancy, diet controlled: Secondary | ICD-10-CM | POA: Diagnosis present

## 2020-09-06 LAB — URINALYSIS, ROUTINE W REFLEX MICROSCOPIC
Bilirubin Urine: NEGATIVE
Glucose, UA: NEGATIVE mg/dL
Hgb urine dipstick: NEGATIVE
Ketones, ur: NEGATIVE mg/dL
Nitrite: NEGATIVE
Protein, ur: NEGATIVE mg/dL
Specific Gravity, Urine: 1.011 (ref 1.005–1.030)
pH: 6 (ref 5.0–8.0)

## 2020-09-06 LAB — RESP PANEL BY RT-PCR (FLU A&B, COVID) ARPGX2
Influenza A by PCR: NEGATIVE
Influenza B by PCR: NEGATIVE
SARS Coronavirus 2 by RT PCR: POSITIVE — AB

## 2020-09-06 LAB — COMPREHENSIVE METABOLIC PANEL
ALT: 18 U/L (ref 0–44)
AST: 17 U/L (ref 15–41)
Albumin: 2.7 g/dL — ABNORMAL LOW (ref 3.5–5.0)
Alkaline Phosphatase: 109 U/L (ref 38–126)
Anion gap: 12 (ref 5–15)
BUN: 10 mg/dL (ref 6–20)
CO2: 19 mmol/L — ABNORMAL LOW (ref 22–32)
Calcium: 9.5 mg/dL (ref 8.9–10.3)
Chloride: 107 mmol/L (ref 98–111)
Creatinine, Ser: 0.72 mg/dL (ref 0.44–1.00)
GFR, Estimated: >60 mL/min (ref >60)
Glucose, Bld: 71 mg/dL (ref 70–99)
Potassium: 3.8 mmol/L (ref 3.5–5.1)
Sodium: 138 mmol/L (ref 135–145)
Total Bilirubin: 0.7 mg/dL (ref 0.3–1.2)
Total Protein: 6 g/dL — ABNORMAL LOW (ref 6.5–8.1)

## 2020-09-06 LAB — POCT URINALYSIS DIPSTICK OB
Blood, UA: NEGATIVE
Glucose, UA: NEGATIVE
Ketones, UA: NEGATIVE
Leukocytes, UA: NEGATIVE
Nitrite, UA: NEGATIVE
POC,PROTEIN,UA: NEGATIVE

## 2020-09-06 LAB — PROTEIN / CREATININE RATIO, URINE
Creatinine, Urine: 68.53 mg/dL
Protein Creatinine Ratio: 0.22 mg/mg{Cre} — ABNORMAL HIGH (ref 0.00–0.15)
Total Protein, Urine: 15 mg/dL

## 2020-09-06 LAB — CBC
HCT: 40.1 % (ref 36.0–46.0)
Hemoglobin: 14 g/dL (ref 12.0–15.0)
MCH: 30.6 pg (ref 26.0–34.0)
MCHC: 34.9 g/dL (ref 30.0–36.0)
MCV: 87.6 fL (ref 80.0–100.0)
Platelets: 247 10*3/uL (ref 150–400)
RBC: 4.58 MIL/uL (ref 3.87–5.11)
RDW: 13.4 % (ref 11.5–15.5)
WBC: 12.4 10*3/uL — ABNORMAL HIGH (ref 4.0–10.5)
nRBC: 0 % (ref 0.0–0.2)

## 2020-09-06 LAB — TYPE AND SCREEN
ABO/RH(D): O POS
Antibody Screen: NEGATIVE

## 2020-09-06 LAB — GROUP B STREP BY PCR: Group B strep by PCR: POSITIVE — AB

## 2020-09-06 MED ORDER — TERBUTALINE SULFATE 1 MG/ML IJ SOLN: 0.2500 mg | Freq: Once | INTRAMUSCULAR | Status: DC | PRN

## 2020-09-06 MED ORDER — MISOPROSTOL 25 MCG QUARTER TABLET
25.0000 ug | ORAL_TABLET | ORAL | Status: DC | PRN
  Administered 2020-09-07: 25 ug via VAGINAL
  Filled 2020-09-06: qty 1

## 2020-09-06 MED ORDER — LACTATED RINGERS IV SOLN: INTRAVENOUS | Status: DC

## 2020-09-06 MED ORDER — SOD CITRATE-CITRIC ACID 500-334 MG/5ML PO SOLN: 30.0000 mL | ORAL | Status: DC | PRN

## 2020-09-06 MED ORDER — PENICILLIN G POT IN DEXTROSE 60000 UNIT/ML IV SOLN
3.0000 10*6.[IU] | INTRAVENOUS | Status: DC
  Administered 2020-09-07 (×2): 3 10*6.[IU] via INTRAVENOUS
  Filled 2020-09-06 (×6): qty 50

## 2020-09-06 MED ORDER — FENTANYL CITRATE (PF) 100 MCG/2ML IJ SOLN
100.0000 ug | INTRAMUSCULAR | Status: DC | PRN
  Administered 2020-09-07 (×2): 100 ug via INTRAVENOUS
  Filled 2020-09-06 (×2): qty 2

## 2020-09-06 MED ORDER — ACETAMINOPHEN 325 MG PO TABS: 650.0000 mg | ORAL_TABLET | ORAL | Status: DC | PRN

## 2020-09-06 MED ORDER — OXYTOCIN-SODIUM CHLORIDE 30-0.9 UT/500ML-% IV SOLN
2.5000 [IU]/h | INTRAVENOUS | Status: DC
  Administered 2020-09-07: 2.5 [IU]/h via INTRAVENOUS
  Filled 2020-09-06: qty 500

## 2020-09-06 MED ORDER — MISOPROSTOL 25 MCG QUARTER TABLET
ORAL_TABLET | ORAL | Status: AC
  Administered 2020-09-06: 25 ug via VAGINAL
  Filled 2020-09-06: qty 1

## 2020-09-06 MED ORDER — SODIUM CHLORIDE 0.9 % IV SOLN
5.0000 10*6.[IU] | Freq: Once | INTRAVENOUS | Status: AC
  Administered 2020-09-06: 5 10*6.[IU] via INTRAVENOUS
  Filled 2020-09-06: qty 5

## 2020-09-06 MED ORDER — OXYCODONE-ACETAMINOPHEN 5-325 MG PO TABS: 2.0000 | ORAL_TABLET | ORAL | Status: DC | PRN

## 2020-09-06 MED ORDER — LIDOCAINE HCL (PF) 1 % IJ SOLN: 30.0000 mL | INTRAMUSCULAR | Status: DC | PRN

## 2020-09-06 MED ORDER — OXYTOCIN BOLUS FROM INFUSION
333.0000 mL | Freq: Once | INTRAVENOUS | Status: AC
  Administered 2020-09-07: 333 mL via INTRAVENOUS

## 2020-09-06 MED ORDER — LACTATED RINGERS IV SOLN: 500.0000 mL | INTRAVENOUS | Status: DC | PRN

## 2020-09-06 MED ORDER — ONDANSETRON HCL 4 MG/2ML IJ SOLN: 4.0000 mg | Freq: Four times a day (QID) | INTRAMUSCULAR | Status: DC | PRN

## 2020-09-06 MED ORDER — OXYCODONE-ACETAMINOPHEN 5-325 MG PO TABS: 1.0000 | ORAL_TABLET | ORAL | Status: DC | PRN

## 2020-09-06 NOTE — MAU Note (Signed)
Notified lab to check on status of GBS by PCR. Lab informed they would run it now.

## 2020-09-06 NOTE — MAU Provider Note (Signed)
Chief Complaint:  BP evaluation   Event Date/Time   First Provider Initiated Contact with Patient 09/06/20 1729     HPI: Kaitlin Klein is a 25 y.o. G1P0 at 13w5dwho was sent to maternity admissions from [facility] for preeclampsia evaluation after elevated pressures in the office. She denies headache, visual disturbances, epigastric pain or flank pain. Denies vaginal bleeding, leaking of fluid, decreased fetal movement, fever, falls, or recent illness.   Past Medical History:  Diagnosis Date  . Asthma    hasnt used inhaler in long time per mother  . Diabetes ([place]   . Diabetes mellitus, type II ([place]   . Headache   . Obesity   . Polycystic ovarian syndrome   . Urinary tract infection    OB History  Gravida Para Term Preterm AB Living  1         0  SAB IAB Ectopic Multiple Live Births               # Outcome Date GA Lbr Len/2nd Weight Sex Delivery Anes PTL Lv  1 Current            Past Surgical History:  Procedure Laterality Date  . LAPAROSCOPIC GASTRIC SLEEVE RESECTION     Family History  Problem Relation Age of Onset  . Hypertension Mother   . Diabetes Mother   . Anxiety disorder Mother   . Alcohol abuse Maternal Uncle   . Diabetes Maternal Grandmother   . Hypertension Maternal Grandmother   . Thyroid cancer Maternal Grandfather   . Hypertension Maternal Grandfather    Social History   Tobacco Use  . Smoking status: Former SResearch scientist (life sciences) . Smokeless tobacco: Never Used  Vaping Use  . Vaping Use: Former  . Substances: Nicotine, Flavoring  Substance Use Topics  . Alcohol use: No    Alcohol/week: 0.0 standard drinks  . Drug use: Not Currently    Types: Marijuana   No Known Allergies Medications Prior to Admission  Medication Sig Dispense Refill Last Dose  . Accu-Chek Softclix Lancets lancets Use as instructed to check blood sugar 4 times daily 100 each 12   . Blood Glucose Monitoring Suppl (ACCU-CHEK GUIDE ME) w/Device KIT 1 each by Does not apply route 4  (four) times daily. 1 kit 0   . Blood Pressure Monitoring (BLOOD PRESSURE CUFF) MISC 1 kit by Does not apply route daily. 1 each 0   . glucose blood (ACCU-CHEK GUIDE) test strip Use as instructed to check blood sugar 4 times daily 50 each 12   . Pediatric Multivit-Minerals-C (FLINTSTONES GUMMIES COMPLETE) CHEW Chew by mouth. (Patient not taking: Reported on 09/06/2020)       I have reviewed patient's Past Medical Hx, Surgical Hx, Family Hx, Social Hx, medications and allergies.   ROS:  Review of Systems  All other systems reviewed and are negative.   Physical Exam   Patient Vitals for the past 24 hrs:  BP Temp Temp src Pulse Resp SpO2 Height Weight  09/06/20 1715 120/83 -- -- 85 -- -- -- --  09/06/20 1708 128/87 -- -- 88 -- -- -- --  09/06/20 1652 133/86 98.5 F (36.9 C) Oral 89 18 100 % -- --  09/06/20 1649 -- -- -- -- -- -- 5' 6"  (1.676 m) 209 lb 6.4 oz (95 kg)   Constitutional: Well-developed, well-nourished female in no acute distress.  Cardiovascular: normal rate & rhythm, no murmur Respiratory: normal effort, lung sounds clear throughout GI: Abd soft,  non-tender, gravid appropriate for gestational age. Pos BS x 4 MS: Extremities nontender, no edema, normal ROM Neurologic: Alert and oriented x 4.  GU: no CVA tenderness Pelvic exam deferred  Fetal Tracing: reactive Baseline:130 Variability: present Accelerations: present Decelerations: none Toco: irregular q10-15   Labs: Results for orders placed or performed in visit on 09/06/20 (from the past 24 hour(s))  POC Urinalysis Dipstick OB     Status: None   Collection Time: 09/06/20  3:24 PM  Result Value Ref Range   Color, UA     Clarity, UA     Glucose, UA Negative Negative   Bilirubin, UA     Ketones, UA neg    Spec Grav, UA     Blood, UA neg    pH, UA     POC,PROTEIN,UA Negative Negative, Trace, Small (1+), Moderate (2+), Large (3+), 4+   Urobilinogen, UA     Nitrite, UA neg    Leukocytes, UA Negative Negative    Appearance     Odor     Imaging:  No results found.  MAU Course: Orders Placed This Encounter  Procedures  . CBC  . Comprehensive metabolic panel  . Protein / creatinine ratio, urine  . Urinalysis, Routine w reflex microscopic Urine, Clean Catch   No orders of the defined types were placed in this encounter.  MDM: Preeclampsia labs normal, P:Cr=0.22  First elevated pressure at 1524, pt continued to have elevated pressures throughout MAU stay meeting criteria for IOL for gestational hypertension  Assessment: Gestational hypertension  Plan: Admit to L&D for IOL at 71w5dL&D team notified, will put in orders  JGaylan Gerold CNM, MSN, [facility]01/12/22 9:22 PM

## 2020-09-06 NOTE — Progress Notes (Signed)
HIGH-RISK PREGNANCY VISIT Patient name: Kaitlin Klein MRN [id number]  Date of birth: [birth year] Chief Complaint:   Routine Prenatal Visit  History of Present Illness:   Kaitlin Klein is a 25 y.o. G1P0 female at [redacted]w[redacted]d with an Estimated Date of Delivery: 09/15/20 being seen today for ongoing management of a high-risk pregnancy complicated by diabetes mellitus A1DM.  Today she reports doing well; denies s/s pre-e.  FBS: all <95    2hr PPs: occ 125, otherwise <120 Depression screen [facility] 2/9 07/07/2020 06/15/2020 04/20/2020 03/09/2020 01/13/2020  Decreased Interest 0 3 2 2  0  Down, Depressed, Hopeless 0 3 3 3 2   PHQ - 2 Score 0 6 5 5 2   Altered sleeping - 2 2 2  0  Tired, decreased energy - 3 3 2 1   Change in appetite - 0 3 3 0  Feeling bad or failure about yourself  - 0 3 2 0  Trouble concentrating - 0 0 0 0  Moving slowly or fidgety/restless - 0 0 0 0  Suicidal thoughts - 0 0 1 0  PHQ-9 Score - 11 16 15 3   Difficult doing work/chores - Not difficult at all Very difficult Very difficult -    Contractions: Not present.  .  Movement: Present. denies leaking of fluid.  Review of Systems:   Pertinent items are noted in HPI Denies abnormal vaginal discharge w/ itching/odor/irritation, headaches, visual changes, shortness of breath, chest pain, abdominal pain, severe nausea/vomiting, or problems with urination or bowel movements unless otherwise stated above. Pertinent History Reviewed:  Reviewed past medical,surgical, social, obstetrical and family history.  Reviewed problem list, medications and allergies. Physical Assessment:   Vitals:   09/06/20 1524 09/06/20 1550  BP: (!) 131/92 (!) 137/97  Pulse: 83 85  Weight: 209 lb (94.8 kg)   Body mass index is 33.73 kg/m.           Physical Examination:   General appearance: alert, well appearing, and in no distress  Mental status: alert, oriented to person, place, and time  Skin: warm & dry   Extremities: Edema: None     Cardiovascular: normal heart rate noted  Respiratory: normal respiratory effort, no distress  Abdomen: gravid, soft, non-tender  Pelvic: Cervical exam deferred         Fetal Status: Fetal Heart Rate (bpm): 143 Fundal Height: 38 cm Movement: Present Presentation: Vertex  Fetal Surveillance Testing today: doppler    Results for orders placed or performed in visit on 09/06/20 (from the past 24 hour(s))  POC Urinalysis Dipstick OB   Collection Time: 09/06/20  3:24 PM  Result Value Ref Range   Color, UA     Clarity, UA     Glucose, UA Negative Negative   Bilirubin, UA     Ketones, UA neg    Spec Grav, UA     Blood, UA neg    pH, UA     POC,PROTEIN,UA Negative Negative, Trace, Small (1+), Moderate (2+), Large (3+), 4+   Urobilinogen, UA     Nitrite, UA neg    Leukocytes, UA Negative Negative   Appearance     Odor      Assessment & Plan:  High-risk pregnancy: G1P0 at [redacted]w[redacted]d with an Estimated Date of Delivery: 09/15/20   1) GDMA1, CBGs stable; EFW 41st% last week  2) New onset elevated BP today, to MAU for further eval of gHTN/pre-e; if not kept for IOL, will schedule IOL for 40wks (for GDM)  Meds: No  orders of the defined types were placed in this encounter.   Labs/procedures today: GBS/cultures in office  Treatment Plan:  To MAU for pre-e labs/serial BPs  Reviewed: Term labor symptoms and general obstetric precautions including but not limited to vaginal bleeding, contractions, leaking of fluid and fetal movement were reviewed in detail with the patient.  All questions were answered.  Follow-up: No follow-ups on file.   No future appointments.  Orders Placed This Encounter  Procedures  . Strep Gp B NAA  . POC Urinalysis Dipstick OB   Arabella Merles [facility] 09/06/2020 3:57 PM

## 2020-09-06 NOTE — MAU Provider Note (Signed)
Pt informed that the ultrasound is considered a limited OB ultrasound and is not intended to be a complete ultrasound exam.  Patient also informed that the ultrasound is not being completed with the intent of assessing for fetal or placental anomalies or any pelvic abnormalities.  Explained that the purpose of today's ultrasound is to assess for presentation.  Patient acknowledges the purpose of the exam and the limitations of the study.    Fetus is in the longitudinal lie Vertex presentation 

## 2020-09-06 NOTE — MAU Note (Signed)
L&D notifed MAU that they are ready for the patient in room 213.

## 2020-09-06 NOTE — H&P (Addendum)
Kaitlin Klein is a 25 y.o. female G1 @ 38.5wks by LMP c/w 8wk scan presenting for IOL due to new onset gHTN. Her preg has been followed by the [facility] office where she was seen for a visit and found to have mildly elevated BPs without pre-e symptoms. Upon further eval in MAU she had additional mild range BPs and neg pre-e labs. Her preg has also been remarkable for GDMA1 (good control, EFW 41st% at 37wks); hx bariatric surgery; hx depression without medications.   OB History    Gravida  1   Para      Term      Preterm      AB      Living  0     SAB      IAB      Ectopic      Multiple      Live Births             Past Medical History:  Diagnosis Date  . Asthma    hasnt used inhaler in long time per mother  . Diabetes ([facility])   . Diabetes mellitus, type II ([facility])   . Headache   . Obesity   . Polycystic ovarian syndrome   . Urinary tract infection    Past Surgical History:  Procedure Laterality Date  . LAPAROSCOPIC GASTRIC SLEEVE RESECTION     Family History: family history includes Alcohol abuse in her maternal uncle; Anxiety disorder in her mother; Diabetes in her maternal grandmother and mother; Hypertension in her maternal grandfather, maternal grandmother, and mother; Thyroid cancer in her maternal grandfather. Social History:  reports that she has quit smoking. She has never used smokeless tobacco. She reports previous drug use. Drug: Marijuana. She reports that she does not drink alcohol.     Maternal Diabetes: Yes:  Diabetes Type:  Diet controlled Genetic Screening: Normal Maternal Ultrasounds/Referrals: Normal Fetal Ultrasounds or other Referrals:  None Maternal Substance Abuse:  No Significant Maternal Medications:  None Significant Maternal Lab Results:  Group B Strep positive and Other: Covid+ 09/06/20 Other Comments:  None  Review of Systems Maternal Medical History:  Fetal activity: Perceived fetal activity is normal.    Prenatal  Complications - Diabetes: gestational. Diabetes is managed by diet.        Blood pressure 126/90, pulse 80, temperature 98.5 F (36.9 C), temperature source Oral, resp. rate 18, height 5\' 6"  (1.676 m), weight 95 kg, last menstrual period 12/10/2019, SpO2 98 %. Exam Physical Exam Constitutional:      Appearance: Normal appearance.  HENT:     Head: Normocephalic.     Nose: Nose normal.     Mouth/Throat:     Mouth: Mucous membranes are moist.  Cardiovascular:     Rate and Rhythm: Normal rate.  Pulmonary:     Effort: Pulmonary effort is normal.  Abdominal:     Comments: EFM 140-150s, +accels, occ mi variables Ctx irreg, mild  Genitourinary:    Comments: Cx 2/70/vtx -1 per RN exam Musculoskeletal:        General: Normal range of motion.     Cervical back: Normal range of motion.  Skin:    General: Skin is warm and dry.  Neurological:     Mental Status: She is alert.  Psychiatric:        Mood and Affect: Mood normal.        Thought Content: Thought content normal.        Judgment: Judgment normal.  Urine P/C ratio: 0.22  CBC    Component Value Date/Time   WBC 12.4 (H) 09/06/2020 1649   RBC 4.58 09/06/2020 1649   HGB 14.0 09/06/2020 1649   HGB 13.0 06/15/2020 0922   HCT 40.1 09/06/2020 1649   HCT 38.1 06/15/2020 0922   PLT 247 09/06/2020 1649   PLT 230 06/15/2020 0922   MCV 87.6 09/06/2020 1649   MCV 89 06/15/2020 0922   MCH 30.6 09/06/2020 1649   MCHC 34.9 09/06/2020 1649   RDW 13.4 09/06/2020 1649   RDW 12.5 06/15/2020 0922   LYMPHSABS 2.6 03/07/2020 1047   MONOABS 0.4 03/17/2019 1556   EOSABS 0.1 03/07/2020 1047   BASOSABS 0.1 03/07/2020 1047   CMP     Component Value Date/Time   NA 138 09/06/2020 1649   K 3.8 09/06/2020 1649   CL 107 09/06/2020 1649   CO2 19 (L) 09/06/2020 1649   GLUCOSE 71 09/06/2020 1649   BUN 10 09/06/2020 1649   CREATININE 0.72 09/06/2020 1649   CALCIUM 9.5 09/06/2020 1649   PROT 6.0 (L) 09/06/2020 1649   ALBUMIN 2.7 (L)  09/06/2020 1649   AST 17 09/06/2020 1649   ALT 18 09/06/2020 1649   ALKPHOS 109 09/06/2020 1649   BILITOT 0.7 09/06/2020 1649   GFRNONAA >60 09/06/2020 1649   GFRAA >60 03/17/2019 1556    Prenatal labs: ABO, Rh: O/Positive/-- (07/13 1047) Antibody: Negative (10/21 0922) Rubella: 6.72 (07/13 1047) RPR: Non Reactive (10/21 0922)  HBsAg: Negative (07/13 1047)  HIV: Non Reactive (10/21 0922)  GBS:   PCR pos 09/06/20  Assessment/Plan: IUP@38 .5wks New onset gHTN GDMA1 Cx unfavorable Covid+ on admission screening  Admit to Labor and Delivery Plan cx ripening with cytotec, possibly cervical foley, Pit/AROM prn Continue to monitor BPs PCN for GBS ppx CBGs q 4h in latent labor to start Appropriate Covid precautions Anticipate vag del   Arabella Merles CNM 09/06/2020, 8:22 PM

## 2020-09-06 NOTE — MAU Note (Signed)
Sent from MD office for BP evaluation.  Denies current H/A, visual disturbances and epigastric pain.  Reports had H/A a couple days ago.  Endorses +FM.  Denies VB or LOF.

## 2020-09-07 ENCOUNTER — Encounter (HOSPITAL_COMMUNITY): Payer: Self-pay | Admitting: Obstetrics & Gynecology

## 2020-09-07 ENCOUNTER — Inpatient Hospital Stay (HOSPITAL_COMMUNITY): Payer: Medicaid Other | Admitting: Anesthesiology

## 2020-09-07 DIAGNOSIS — U071 COVID-19: Secondary | ICD-10-CM | POA: Diagnosis present

## 2020-09-07 LAB — CBC
HCT: 36.6 % (ref 36.0–46.0)
Hemoglobin: 12.6 g/dL (ref 12.0–15.0)
MCH: 30.3 pg (ref 26.0–34.0)
MCHC: 34.4 g/dL (ref 30.0–36.0)
MCV: 88 fL (ref 80.0–100.0)
Platelets: 221 10*3/uL (ref 150–400)
RBC: 4.16 MIL/uL (ref 3.87–5.11)
RDW: 13.4 % (ref 11.5–15.5)
WBC: 10.9 10*3/uL — ABNORMAL HIGH (ref 4.0–10.5)
nRBC: 0 % (ref 0.0–0.2)

## 2020-09-07 LAB — GLUCOSE, CAPILLARY
Glucose-Capillary: 74 mg/dL (ref 70–99)
Glucose-Capillary: 75 mg/dL (ref 70–99)
Glucose-Capillary: 93 mg/dL (ref 70–99)
Glucose-Capillary: 74 mg/dL (ref 70–99)

## 2020-09-07 LAB — RPR: RPR Ser Ql: NONREACTIVE

## 2020-09-07 MED ORDER — FENTANYL-BUPIVACAINE-NACL 0.5-0.125-0.9 MG/250ML-% EP SOLN: 12.0000 mL/h | EPIDURAL | Status: DC | PRN

## 2020-09-07 MED ORDER — EPHEDRINE 5 MG/ML INJ: 10.0000 mg | INTRAVENOUS | Status: DC | PRN

## 2020-09-07 MED ORDER — ACETAMINOPHEN 325 MG PO TABS: 650.0000 mg | ORAL_TABLET | ORAL | Status: DC | PRN

## 2020-09-07 MED ORDER — PHENYLEPHRINE 40 MCG/ML (10ML) SYRINGE FOR IV PUSH (FOR BLOOD PRESSURE SUPPORT)
80.0000 ug | PREFILLED_SYRINGE | INTRAVENOUS | Status: DC | PRN
  Administered 2020-09-07: 80 ug via INTRAVENOUS

## 2020-09-07 MED ORDER — MEASLES, MUMPS & RUBELLA VAC IJ SOLR: 0.5000 mL | Freq: Once | INTRAMUSCULAR | Status: DC

## 2020-09-07 MED ORDER — WITCH HAZEL-GLYCERIN EX PADS: 1.0000 "application " | MEDICATED_PAD | CUTANEOUS | Status: DC | PRN

## 2020-09-07 MED ORDER — IBUPROFEN 600 MG PO TABS
600.0000 mg | ORAL_TABLET | Freq: Four times a day (QID) | ORAL | Status: DC
  Administered 2020-09-07 – 2020-09-09 (×6): 600 mg via ORAL
  Filled 2020-09-07 (×6): qty 1

## 2020-09-07 MED ORDER — COCONUT OIL OIL: 1.0000 "application " | TOPICAL_OIL | Status: DC | PRN

## 2020-09-07 MED ORDER — DIPHENHYDRAMINE HCL 50 MG/ML IJ SOLN: 12.5000 mg | INTRAMUSCULAR | Status: DC | PRN

## 2020-09-07 MED ORDER — SODIUM CHLORIDE (PF) 0.9 % IJ SOLN
INTRAMUSCULAR | Status: DC | PRN
  Administered 2020-09-07: 12 mL/h via EPIDURAL

## 2020-09-07 MED ORDER — LACTATED RINGERS IV SOLN: 500.0000 mL | Freq: Once | INTRAVENOUS | Status: DC

## 2020-09-07 MED ORDER — PRENATAL MULTIVITAMIN CH
1.0000 | ORAL_TABLET | Freq: Every day | ORAL | Status: DC
  Administered 2020-09-08 – 2020-09-09 (×2): 1 via ORAL
  Filled 2020-09-07 (×2): qty 1

## 2020-09-07 MED ORDER — PHENYLEPHRINE 40 MCG/ML (10ML) SYRINGE FOR IV PUSH (FOR BLOOD PRESSURE SUPPORT)
80.0000 ug | PREFILLED_SYRINGE | INTRAVENOUS | Status: DC | PRN
  Filled 2020-09-07: qty 10

## 2020-09-07 MED ORDER — ONDANSETRON HCL 4 MG/2ML IJ SOLN: 4.0000 mg | INTRAMUSCULAR | Status: DC | PRN

## 2020-09-07 MED ORDER — TETANUS-DIPHTH-ACELL PERTUSSIS 5-2.5-18.5 LF-MCG/0.5 IM SUSY: 0.5000 mL | PREFILLED_SYRINGE | Freq: Once | INTRAMUSCULAR | Status: DC

## 2020-09-07 MED ORDER — OXYTOCIN-SODIUM CHLORIDE 30-0.9 UT/500ML-% IV SOLN
1.0000 m[IU]/min | INTRAVENOUS | Status: DC
  Administered 2020-09-07: 2 m[IU]/min via INTRAVENOUS

## 2020-09-07 MED ORDER — TERBUTALINE SULFATE 1 MG/ML IJ SOLN: 0.2500 mg | Freq: Once | INTRAMUSCULAR | Status: DC | PRN

## 2020-09-07 MED ORDER — LIDOCAINE HCL (PF) 1 % IJ SOLN
INTRAMUSCULAR | Status: DC | PRN
  Administered 2020-09-07: 6 mL via EPIDURAL

## 2020-09-07 MED ORDER — FENTANYL-BUPIVACAINE-NACL 0.5-0.125-0.9 MG/250ML-% EP SOLN
12.0000 mL/h | EPIDURAL | Status: DC | PRN
  Filled 2020-09-07: qty 250

## 2020-09-07 MED ORDER — DIPHENHYDRAMINE HCL 25 MG PO CAPS: 25.0000 mg | ORAL_CAPSULE | Freq: Four times a day (QID) | ORAL | Status: DC | PRN

## 2020-09-07 MED ORDER — ONDANSETRON HCL 4 MG PO TABS: 4.0000 mg | ORAL_TABLET | ORAL | Status: DC | PRN

## 2020-09-07 MED ORDER — SIMETHICONE 80 MG PO CHEW: 80.0000 mg | CHEWABLE_TABLET | ORAL | Status: DC | PRN

## 2020-09-07 MED ORDER — DIBUCAINE (PERIANAL) 1 % EX OINT: 1.0000 "application " | TOPICAL_OINTMENT | CUTANEOUS | Status: DC | PRN

## 2020-09-07 MED ORDER — BENZOCAINE-MENTHOL 20-0.5 % EX AERO: 1.0000 "application " | INHALATION_SPRAY | CUTANEOUS | Status: DC | PRN

## 2020-09-07 MED ORDER — SENNOSIDES-DOCUSATE SODIUM 8.6-50 MG PO TABS
2.0000 | ORAL_TABLET | ORAL | Status: DC
  Administered 2020-09-08 – 2020-09-09 (×2): 2 via ORAL
  Filled 2020-09-07 (×2): qty 2

## 2020-09-07 NOTE — Progress Notes (Signed)
Labor Progress Note Kaitlin Klein is a 25 y.o. G1P0 at [redacted]w[redacted]d presented for IOL-gHTN. S: patient comfortable with epidural  O:  BP 119/87   Pulse 86   Temp 98 F (36.7 C) (Oral)   Resp 16   Ht 5\' 6"  (1.676 m)   Wt 95 kg   LMP 12/10/2019 (Exact Date)   SpO2 98%   BMI 33.80 kg/m  EFM: baseline 120bpm/mod variability/+accels/intermittent late decels after epidural, resolved. Intermittent variable decels. Toco: difficulty tracing, q1-5 min  CVE: Dilation: 10 Dilation Complete Date: 09/07/20 Dilation Complete Time: 1432 Effacement (%): 100 Station: 0 Presentation: Vertex Exam by:: lee   A&P: 25 y.o. G1P0 [redacted]w[redacted]d presented for IOL-gHTN. #IOL: s/p cytotec x2. Pit started @0948 , now at 10. She has progressed to complete. Patient is not feeling any urge to push, and G1P0. Recommend labor down for now. #Pain: epidural #FWB: cat 2 #GBS positive, adequate prophylaxis #gHTN: no severe range BP. Asymptomatic. preE labs unremarkable, repeat preE labs pending given 24 hours since last draw. Continue to monitor.  #A1GDM: EFW 41%ile on 08/28/20. q4 BGL monitoring in latent labor, q2 in active labor. BG has been appropriate. #COVID +: asymptomatic. Airborne precautions. #Anxiety/depression/history SI: not on meds. SW pp.  , MD 2:53 PM

## 2020-09-07 NOTE — Anesthesia Preprocedure Evaluation (Signed)
Anesthesia Evaluation  Patient identified by MRN, date of birth, ID band Patient awake    Reviewed: Allergy & Precautions, H&P , NPO status , Patient's Chart, lab work & pertinent test results, reviewed documented beta blocker date and time   Airway Mallampati: III  TM Distance: >3 FB Neck ROM: full    Dental no notable dental hx. (+) Teeth Intact, Dental Advisory Given   Pulmonary asthma , former smoker,    Pulmonary exam normal breath sounds clear to auscultation       Cardiovascular hypertension, Normal cardiovascular exam Rhythm:regular Rate:Normal     Neuro/Psych  Headaches, PSYCHIATRIC DISORDERS Depression    GI/Hepatic negative GI ROS, Neg liver ROS,   Endo/Other  diabetes  Renal/GU negative Renal ROS  negative genitourinary   Musculoskeletal   Abdominal   Peds  Hematology negative hematology ROS (+)   Anesthesia Other Findings   Reproductive/Obstetrics (+) Pregnancy                             Anesthesia Physical Anesthesia Plan  ASA: III  Anesthesia Plan: Epidural   Post-op Pain Management:    Induction:   PONV Risk Score and Plan: 2  Airway Management Planned: Natural Airway  Additional Equipment:   Intra-op Plan:   Post-operative Plan:   Informed Consent: I have reviewed the patients History and Physical, chart, labs and discussed the procedure including the risks, benefits and alternatives for the proposed anesthesia with the patient or authorized representative who has indicated his/her understanding and acceptance.       Plan Discussed with:   Anesthesia Plan Comments:         Anesthesia Quick Evaluation

## 2020-09-07 NOTE — Anesthesia Procedure Notes (Signed)
Epidural Patient location during procedure: OB Start time: 09/07/2020 10:54 AM End time: 09/07/2020 11:00 AM  Staffing Anesthesiologist: Bethena Midget, MD  Preanesthetic Checklist Completed: patient identified, IV checked, site marked, risks and benefits discussed, surgical consent, monitors and equipment checked, pre-op evaluation and timeout performed  Epidural Patient position: sitting Prep: DuraPrep and site prepped and draped Patient monitoring: continuous pulse ox and blood pressure Approach: midline Location: L3-L4 Injection technique: LOR air  Needle:  Needle type: Tuohy  Needle gauge: 17 G Needle length: 9 cm and 9 Needle insertion depth: 5 cm cm Catheter type: closed end flexible Catheter size: 19 Gauge Catheter at skin depth: 10 cm Test dose: negative  Assessment Events: blood not aspirated, injection not painful, no injection resistance, no paresthesia and negative IV test

## 2020-09-07 NOTE — Progress Notes (Signed)
Blood glucose not rolling over to epic. Blood glucose at 0800 was 74.lee

## 2020-09-07 NOTE — Discharge Instructions (Signed)

## 2020-09-07 NOTE — Discharge Summary (Addendum)
Postpartum Discharge Summary     Patient Name: Kaitlin Klein DOB: 18-Jul-1996 MRN: [id number]  Date of admission: 09/06/2020 Delivery date:09/07/2020  Delivering provider: Gladys Damme  Date of discharge: 09/09/2020  Admitting diagnosis: Gestational hypertension [O13.9] Intrauterine pregnancy: [redacted]w[redacted]d    Secondary diagnosis:  Active Problems:   Adjustment disorder with depressed mood   MDD (major depressive disorder), recurrent episode, severe (HCC)   Severe episode of recurrent major depressive disorder, without psychotic features ([facility]ache   History of depression   History of bariatric surgery   Supervision of high risk pregnancy, antepartum   Suicidal ideation   Gestational diabetes mellitus, class A1   Gestational hypertension   COVID-19 affecting pregnancy in third trimester   Vaginal delivery  Additional problems: none    Discharge diagnosis: Term Pregnancy Delivered, Gestational Hypertension and GDM A1                                              Post partum procedures:none Augmentation: Pitocin and Cytotec Complications: None  Hospital course: Induction of Labor With Vaginal Delivery   25y.o. yo G1P0 at 351w6das admitted to the hospital 09/06/2020 for induction of labor.  Indication for induction: Gestational hypertension and A1 DM. She had neg pre-e labs and mild range BPs in labor. Patient had an uncomplicated labor course as follows: Membrane Rupture Time/Date: 6:57 AM ,09/07/2020   Delivery Method:Vaginal, Spontaneous  Episiotomy: None  Lacerations:  None  Details of delivery can be found in separate delivery note.  Patient had a routine postpartum course, remaining normotensive without mediations. Her FBS on PPD#1 was 71. She had a SW consult for hx of depression and is doing well currently and will have a mood & BP check at [facility] in 1wk. Patient is discharged home 09/09/20.  Newborn Data: Birth date:09/07/2020  Birth time:4:24 PM  Gender:Female  Living  status:Living  Apgars:9 ,9  Weight:3079 g (6lb 12.6oz)  Magnesium Sulfate received: No BMZ received: No Rhophylac:N/A MMR:N/A T-DaP:Given prenatally Flu: Yes Transfusion:No  Physical exam  Vitals:   09/08/20 0530 09/08/20 1410 09/08/20 2122 09/09/20 0545  BP: 122/87 123/78 133/88 128/85  Pulse:  85 73 67  Resp: _0 Temp: 97.8 F (36.6 C) 98.2 F (36.8 C) 98.7 F (37.1 C) 97.7 F (36.5 C)  TempSrc: Oral Oral Oral Oral  SpO2:  100%    Weight:      Height:       General: alert, cooperative and no distress Lochia: appropriate Uterine Fundus: firm Incision: N/A DVT Evaluation: No evidence of DVT seen on physical exam. Labs: Lab Results  Component Value Date   WBC 10.9 (H) 09/07/2020   HGB 12.6 09/07/2020   HCT 36.6 09/07/2020   MCV 88.0 09/07/2020   PLT 221 09/07/2020   CMP Latest Ref Rng & Units 09/06/2020  Glucose 70 - 99 mg/dL 71  BUN 6 - 20 mg/dL 10  Creatinine 0.44 - 1.00 mg/dL 0.72  Sodium 135 - 145 mmol/L 138  Potassium 3.5 - 5.1 mmol/L 3.8  Chloride 98 - 111 mmol/L 107  CO2 22 - 32 mmol/L 19(L)  Calcium 8.9 - 10.3 mg/dL 9.5  Total Protein 6.5 - 8.1 g/dL 6.0(L)  Total Bilirubin 0.3 - 1.2 mg/dL 0.7  Alkaline Phos 38 - 126 U/L 109  AST 15 - 41 U/L 17  ALT 0 - 44 U/L 18   Edinburgh Score: Edinburgh Postnatal Depression Scale Screening Tool 09/07/2020  I have been able to laugh and see the funny side of things. 0  I have looked forward with enjoyment to things. 0  I have blamed myself unnecessarily when things went wrong. 1  I have been anxious or worried for no good reason. 1  I have felt scared or panicky for no good reason. 1  Things have been getting on top of me. 0  I have been so unhappy that I have had difficulty sleeping. 0  I have felt sad or miserable. 2  I have been so unhappy that I have been crying. 0  The thought of harming myself has occurred to me. 0  Edinburgh Postnatal Depression Scale Total 5     After visit meds:   Allergies as of 09/09/2020   No Known Allergies     Medication List    STOP taking these medications   Accu-Chek Guide Me w/Device Kit   Accu-Chek Guide test strip Generic drug: glucose blood   Accu-Chek Softclix Lancets lancets     TAKE these medications   acetaminophen 325 MG tablet Commonly known as: Tylenol Take 2 tablets (650 mg total) by mouth every 4 (four) hours as needed for mild pain, moderate pain, fever or headache (for pain scale < 4).   Blood Pressure Cuff Misc 1 kit by Does not apply route daily.   Flintstones Gummies Complete Chew Chew by mouth.   ibuprofen 600 MG tablet Commonly known as: ADVIL Take 1 tablet (600 mg total) by mouth every 6 (six) hours as needed for fever, headache, moderate pain or cramping.        Discharge home in stable condition Infant Feeding: Bottle Infant Disposition:home with mother Discharge instruction: per After Visit Summary and Postpartum booklet. Activity: Advance as tolerated. Pelvic rest for 6 weeks.  Diet: routine diet Future Appointments: Future Appointments  Date Time Provider [place]  09/12/2020 11:50 AM Caren Macadam, MD [facility]  10/13/2020 11:30 AM Roma Schanz, CNM [facility]   Follow up Visit:  Follow-up Information    [facility] Follow [facility].   Specialty: Obstetrics and Gynecology Why: 09/12/20 Blood pressure check 10/13/20: postpartum visit Contact information: [facility] up.   Specialty: Obstetrics and Gynecology Why: as needed in pregnancy-related emergencies Contact information: [address] [id number] [place] [phone]             Message sent to [facility] 09/07/20 by Sylvester Harder.   Please schedule this patient for a In person postpartum visit in 4 weeks with the following provider: Any  provider. Additional Postpartum F/U:Postpartum Depression checkup, 2 hour GTT and BP check 1 week  High risk pregnancy complicated by: GDM and HTN Delivery mode:  Vaginal, Spontaneous  Anticipated Birth Control:  POPs   09/09/2020 Gladys Damme, MD  CNM attestation I have seen and examined this patient and agree with above documentation in the resident's note.   SEVIN FARONE is a 25 y.o. G1P1001 s/p vag del with IOL for gHTN and also w GDMA1.   Pain is well controlled.  Plan for birth control is oral contraceptives (estrogen/progesterone).  Method of Feeding: bottle  PE:  BP 128/85 (BP Location: Right Arm)   Pulse 67   Temp 97.7 F (36.5  C) (Oral)   Resp 18   Ht _0  (1.676 m)   Wt 95 kg   LMP 12/10/2019 (Exact Date)   SpO2 100%   Breastfeeding Unknown   BMI 33.80 kg/m  Fundus firm  No results for input(s): HGB, HCT in the last 72 hours.   Plan: discharge today - postpartum care discussed - f/u clinic in 1wk for mood & BP check;  6 weeks for postpartum visit   Myrtis Ser, CNM 5:57 PM

## 2020-09-07 NOTE — Progress Notes (Signed)
Labor Progress Note Kaitlin Klein is a 25 y.o. G1P0 at [redacted]w[redacted]d presented for IOL-gHTN. S: Strip reviewed  O:  BP 116/79   Pulse 63   Temp 98 F (36.7 C) (Oral)   Resp 16   Ht 5\' 6"  (1.676 m)   Wt 95 kg   LMP 12/10/2019 (Exact Date)   SpO2 98%   BMI 33.80 kg/m  EFM: baseline 120bpm/mod variability/+accels/intermittent late decels after epidural, resolved. Intermittent variable decels. Toco: difficulty tracing, q1-5 min  CVE: Dilation: 5.5 Effacement (%): 100 Station: -1 Presentation: Vertex Exam by:: lee   A&P: 25 y.o. G1P0 [redacted]w[redacted]d presented for IOL-gHTN. #IOL: s/p cytotec x2. Pit started @0948 , continue to titrate. #Pain: epidural #FWB: cat 1 #GBS positive, adequate prophylaxis #gHTN: no severe range BP. Asymptomatic. preE labs unremarkable, repeat preE labs pending given 24 hours since last draw. Continue to monitor.  #A1GDM: EFW 41%ile on 08/28/20. q4 BGL monitoring in latent labor, q2 in active labor.  #COVID +: asymptomatic. Airborne precautions. #Anxiety/depression/history SI: not on meds. SW pp.  , MD 1:59 PM

## 2020-09-07 NOTE — Progress Notes (Signed)
Labor Progress Note Kaitlin Klein is a 25 y.o. G1P0 at [redacted]w[redacted]d presented for IOL-gHTN. S: Doing well without complaints, feeling contractions.  O:  BP (!) 126/99   Pulse 72   Temp 98.4 F (36.9 C) (Oral)   Resp 17   Ht 5\' 6"  (1.676 m)   Wt 95 kg   LMP 12/10/2019 (Exact Date)   SpO2 98%   BMI 33.80 kg/m  EFM: baseline 120bpm/mod variability/+accels/no decels Toco: q2-5 min  CVE: Dilation: 5 Effacement (%): 70 Station: -1 Presentation: Vertex Exam by:: Corrine Tillis   A&P: 25 y.o. G1P0 [redacted]w[redacted]d presented for IOL-gHTN. #IOL: s/p cytotec x2. Will start pitocin, continue to titrate. #Pain: PRN, desires epidural #FWB: cat 1 #GBS positive, adequate prophylaxis #gHTN: no severe range BP. Asymptomatic. preE labs unremarkable. Continue to monitor. #A1GDM: EFW 41%ile on 08/28/20. q4 BGL monitoring in latent labor, q2 in active labor.  #COVID +: asymptomatic. Airborne precautions. #Anxiety/depression/history SI: not on meds. SW pp.  10/26/20, MD 9:44 AM

## 2020-09-07 NOTE — Progress Notes (Signed)
Patient ID: Kaitlin Klein, female   DOB: 1995-09-23, 23 y.o.   MRN: [id number]  Up in bathroom; s/p cytotec x 2 doses (last at 0400)  BP 133/97, P 73 FHR 120-130s, +accels, no decels Ctx irreg to 3-5 mins Cx deferred  IUP@38 .6wks gHTN- stable Cx unfavorable  Plan to have day team check cx after sign out to determine plan of care to continue progression of induction  Kaitlin Klein 09/07/2020

## 2020-09-07 NOTE — Progress Notes (Signed)
1200 blood glucose is 74. lee

## 2020-09-08 LAB — CERVICOVAGINAL ANCILLARY ONLY
Chlamydia: NEGATIVE
Comment: NEGATIVE
Comment: NORMAL
Neisseria Gonorrhea: NEGATIVE

## 2020-09-08 LAB — GLUCOSE, CAPILLARY: Glucose-Capillary: 71 mg/dL (ref 70–99)

## 2020-09-08 LAB — STREP GP B NAA: Strep Gp B NAA: POSITIVE — AB

## 2020-09-08 LAB — SPECIMEN STATUS REPORT

## 2020-09-08 NOTE — Progress Notes (Signed)
CSW received consult for hx of Anxiety, hx of SI and Depression.  CSW met with MOB to offer support and complete assessment.    CSW called into MOB's room to speak with her. CSW congratulated MOB On the birth of infant. CSW advised MOB of CSW's role and the reason for CSW coming to speak with her. MOB expressed that she was diagnosed with anxiety and depression at the age of 34. MOB indicated that she was "on depression pills" in the past however MOB expressed that she stopped taking the medication "awhile ago". MOB expressed that she does have a hx of SI from along time ago. CSW inquired from [facility] on having these feelings during this pregnancy or recently and MOB declined that she has been feeling SI recently or during her pregnancy. MOB expressed that she also used to have therapist however MOB declined having one at this time. CSW offered MOB therapy resources however MOB declined stating she thinks that she is well stable. CSW understanding and made aware of no other mental health hx or diagnosis. MOB also reported no feelings of SI, HI or DV to this CSW   CSW inquired from [facility] on who her supports are. MOB identified her mother as he primary support. MOB expressed that she has all needed items to care for infant with plans for infant to sleep in basinet once arrived home. MOB expressed that she has no other needs and has been feeling alright since she has given birth.    CSW provided education regarding the baby blues period vs. perinatal mood disorders, discussed treatment and gave resources for mental health follow up if concerns arise.  CSW recommends self-evaluation during the postpartum time period using the New Mom Checklist from Postpartum Progress and encouraged MOB to contact a medical professional if symptoms are noted at any time.   CSW provided review of Sudden Infant Death Syndrome (SIDS) precautions.   CSW identifies no further need for intervention and no barriers to discharge at this  time.   Virgie Dad Athen Riel, MSW, LCSW [facility] [place] [facility] [place] [phone]

## 2020-09-08 NOTE — Progress Notes (Signed)
Post Partum Day 1 Subjective: no complaints, up ad lib, voiding and tolerating PO, small lochia, plans to bottle feed, oral contraceptives (estrogen/progesterone)  Objective: Blood pressure 122/87, pulse 76, temperature 97.8 F (36.6 C), temperature source Oral, resp. rate 18, height 5\' 6"  (1.676 m), weight 95 kg, last menstrual period 12/10/2019, SpO2 99 %, unknown if currently breastfeeding.  Physical Exam:  General: alert, cooperative and no distress Lochia:normal flow Chest: CTAB Heart: RRR no m/r/g Abdomen: +BS, soft, nontender,  Uterine Fundus: firm DVT Evaluation: No evidence of DVT seen on physical exam. Extremities: trace edema  Recent Labs    09/06/20 1649 09/07/20 0931  HGB 14.0 12.6  HCT 40.1 36.6    Assessment/Plan: Breastfeeding  DC tomorrow   LOS: 2 days   09/09/20 09/08/2020, 8:15 AM

## 2020-09-08 NOTE — Anesthesia Postprocedure Evaluation (Signed)
Anesthesia Post Note  Patient: Kaitlin Klein  Procedure(s) Performed: AN AD HOC LABOR EPIDURAL     Patient location during evaluation: Mother Baby Anesthesia Type: Epidural Level of consciousness: awake and alert and oriented Pain management: satisfactory to patient Vital Signs Assessment: post-procedure vital signs reviewed and stable Respiratory status: respiratory function stable Cardiovascular status: stable Postop Assessment: no headache, no backache, epidural receding, patient able to bend at knees, no signs of nausea or vomiting, adequate PO intake and able to ambulate Anesthetic complications: no   No complications documented.  Last Vitals:  Vitals:   09/08/20 0235 09/08/20 0530  BP: 128/90 122/87  Pulse: 76   Resp: 18 18  Temp: 36.6 C 36.6 C  SpO2:      Last Pain:  Vitals:   09/08/20 0530  TempSrc: Oral  PainSc: 0-No pain   Pain Goal:                   Na Waldrip

## 2020-09-09 MED ORDER — IBUPROFEN 600 MG PO TABS: 600.0000 mg | ORAL_TABLET | Freq: Four times a day (QID) | ORAL | 0 refills | Status: DC | PRN

## 2020-09-09 MED ORDER — ACETAMINOPHEN 325 MG PO TABS: 650.0000 mg | ORAL_TABLET | ORAL | 1 refills | Status: DC | PRN

## 2020-09-11 ENCOUNTER — Encounter: Payer: Self-pay | Admitting: *Deleted

## 2020-09-12 ENCOUNTER — Encounter: Payer: Self-pay | Admitting: *Deleted

## 2020-09-12 ENCOUNTER — Encounter: Payer: Medicaid Other | Admitting: *Deleted

## 2020-09-12 NOTE — Progress Notes (Signed)
Attempted to call patient and connect via mychart for her visit. Pt did not respond in mychart multiple times. Also sent mychart message to patient to let her know I was unable to see or hear her.

## 2020-10-13 ENCOUNTER — Telehealth (INDEPENDENT_AMBULATORY_CARE_PROVIDER_SITE_OTHER): Payer: Medicaid Other | Admitting: Women's Health

## 2020-10-13 ENCOUNTER — Encounter: Payer: Self-pay | Admitting: Women's Health

## 2020-10-13 ENCOUNTER — Other Ambulatory Visit: Payer: Self-pay

## 2020-10-13 ENCOUNTER — Telehealth: Payer: Self-pay | Admitting: Women's Health

## 2020-10-13 DIAGNOSIS — Z1389 Encounter for screening for other disorder: Secondary | ICD-10-CM

## 2020-10-13 DIAGNOSIS — Z30011 Encounter for initial prescription of contraceptive pills: Secondary | ICD-10-CM

## 2020-10-13 DIAGNOSIS — Z8632 Personal history of gestational diabetes: Secondary | ICD-10-CM

## 2020-10-13 MED ORDER — LO LOESTRIN FE 1 MG-10 MCG / 10 MCG PO TABS: 1.0000 | ORAL_TABLET | Freq: Every day | ORAL | 3 refills | Status: DC

## 2020-10-13 NOTE — Patient Instructions (Signed)
You will have your sugar test next visit.  Please do not eat or drink anything after midnight the night before you come, not even water.  You will be here for at least two hours.  Please make an appointment online for the bloodwork at [email or web address] for 8:30am (or as close to this as possible). Make sure you select the [address] service center. The day of the appointment, check in with our office first, then you will go to [facility] to start the sugar test.   

## 2020-10-13 NOTE — Progress Notes (Signed)
TELEHEALTH VIRTUAL POSTPARTUM VISIT ENCOUNTER NOTE Patient name: Kaitlin Klein MRN [id number]  Date of birth: [birth year]  I connected with patient on 10/13/20 at 11:30 AM EST by telephone (couldn't get mychart to work) and verified that I am speaking with the correct person using two identifiers. Pt is not currently in our office, she is at home.  The provider is in the office.    I discussed the limitations, risks, security and privacy concerns of performing an evaluation and management service by telephone and the availability of in person appointments. I also discussed with the patient that there may be a patient responsible charge related to this service. The patient expressed understanding and agreed to proceed.  Chief Complaint:   Postpartum Care  History of Present Illness:   Kaitlin Klein is a 25 y.o. G65P1001 African American female being seen today for a postpartum visit. She is 5 weeks postpartum following a spontaneous vaginal delivery at 38.6 gestational weeks. IOL: Yes, for Gestational hypertension. Anesthesia: epidural.  Laceration: none.  Complications: none. Inpatient contraception: no.   Pregnancy complicated by G3OV and GHTN. Tobacco use: no. Substance use disorder: no. Last pap smear: 03/09/20 and results were NILM w/ HRHPV not done. Next pap smear due: 2024 Patient's last menstrual period was 10/11/2020 (exact date).  Postpartum course has been uncomplicated. Bleeding on period now. Bowel function is normal. Bladder function is normal. Urinary incontinence? No, fecal incontinence? No Patient is sexually active. Last sexual activity: last Sat.  Desired contraception: OCPs. Patient does want a pregnancy in the future.  Desired family size is 6 children.   Upstream - 10/13/20 1159      Pregnancy Intention Screening   Does the patient want to become pregnant in the next year? Yes    Does the patient's partner want to become pregnant in the next year? Yes    Would the  patient like to discuss contraceptive options today? Yes      Contraception Wrap Up   Current Method Withdrawal or Other Method    End Method Oral Contraceptive    Contraception Counseling Provided Yes          The pregnancy intention screening data noted above was reviewed. Potential methods of contraception were discussed. The patient elected to proceed with Oral Contraceptive.   Edinburgh Postpartum Depression Screening: Negative, does have h/o dep/anx, no meds, feels she is doing well overall. Denies HI/II. H/O suicide ideation, states she randomly has thoughts of harming self, but would never act on them, this has been for years and has not changed/worsened.   Edinburgh Postnatal Depression Scale - 10/13/20 1156      Edinburgh Postnatal Depression Scale:  In the Past 7 Days   I have been able to laugh and see the funny side of things. 0    I have looked forward with enjoyment to things. 0    I have blamed myself unnecessarily when things went wrong. 1    I have been anxious or worried for no good reason. 2    I have felt scared or panicky for no good reason. 0    Things have been getting on top of me. 1    I have been so unhappy that I have had difficulty sleeping. 1    I have felt sad or miserable. 1    I have been so unhappy that I have been crying. 1    The thought of harming myself has occurred to me. 1  Edinburgh Postnatal Depression Scale Total 8          Baby's course has been uncomplicated. Baby is feeding by bottle. Infant has a pediatrician/family doctor? Yes.  Childcare strategy if returning to work/school: n/a.  Pt has material needs met for her and baby: Yes.   Review of Systems:   Pertinent items are noted in HPI Denies Abnormal vaginal discharge w/ itching/odor/irritation, headaches, visual changes, shortness of breath, chest pain, abdominal pain, severe nausea/vomiting, or problems with urination or bowel movements. Pertinent History Reviewed:  Reviewed past  medical,surgical, obstetrical and family history.  Reviewed problem list, medications and allergies. OB History  Gravida Para Term Preterm AB Living  1 1 1     1   SAB IAB Ectopic Multiple Live Births        0 1    # Outcome Date GA Lbr Len/2nd Weight Sex Delivery Anes PTL Lv  1 Term 09/07/20 [id number]:32 / 01:51 6 lb 12.6 oz (3.079 kg) F Vag-Spont EPI  LIV   Physical Assessment:   Vitals:   10/13/20 1155  BP: 133/85  Pulse: 81  Weight: 196 lb (88.9 kg)  Body mass index is 31.64 kg/m.         Physical Examination:   General:  Alert, oriented and cooperative.   Mental Status: Normal mood and affect perceived.   Rest of physical exam deferred due to type of encounter       No results found for this or any previous visit (from the past 24 hour(s)).  Assessment & Plan:  1) Postpartum exam 2) 5 wks s/p spontaneous vaginal delivery after IOL for GHTN, A1DM 3) bottle feeding 4) Depression screening 5) Contraception management, rx LoLoestrin, condoms x 2wks, f/u 337ms  Essential components of care per ACOG recommendations:  1.  Mood and well being:  . If positive depression screen, discussed and plan developed.  . If using tobacco we discussed reduction/cessation and risk of relapse . If current substance abuse, we discussed and referral to local resources was offered.   2. Infant care and feeding:  . If breastfeeding, discussed returning to work, pumping, breastfeeding-associated pain, guidance regarding return to fertility while lactating if not using another method. If needed, patient was provided with a letter to be allowed to pump q 2-3hrs to support lactation in a private location with access to a refrigerator to store breastmilk.   . Recommended that all caregivers be immunized for flu, pertussis and other preventable communicable diseases . If pt does not have material needs met for her/baby, referred to local resources for help obtaining these.  3. Sexuality,  contraception and birth spacing . Provided guidance regarding sexuality, management of dyspareunia, and resumption of intercourse . Discussed avoiding interpregnancy interval <33m64m and recommended birth spacing of 18 months  4. Sleep and fatigue . Discussed coping options for fatigue and sleep disruption . Encouraged family/partner/community support of 4 hrs of uninterrupted sleep to help with mood and fatigue  5. Physical recovery  . If pt had a C/S, assessed incisional pain and providing guidance on normal vs prolonged recovery . If pt had a laceration, perineal healing and pain reviewed.  . If urinary or fecal incontinence, discussed management and referred to PT or uro/gyn if indicated  . Patient is safe to resume physical activity. Discussed attainment of healthy weight.  6.  Chronic disease management . Discussed pregnancy complications if any, and their implications for future childbearing and long-term maternal health. . Review recommendations for  prevention of recurrent pregnancy complications, such as 17 hydroxyprogesterone caproate to reduce risk for recurrent PTB not applicable, or aspirin to reduce risk of preeclampsia yes. . Pt had GDM: Yes. If yes, 2hr GTT scheduled: yes. . Reviewed medications and non-pregnant dosing including consideration of whether pt is breastfeeding using a reliable resource such as LactMed: not applicable . Referred for f/u w/ PCP or subspecialist providers as indicated: not applicable  7. Health maintenance . Mammogram at 25yo or earlier if indicated . Pap smears as indicated  Meds:  Meds ordered this encounter  Medications  . LO LOESTRIN FE 1 MG-10 MCG / 10 MCG tablet    Sig: Take 1 tablet by mouth daily.    Dispense:  90 tablet    Refill:  3    For co-pay card, pt to text "Lo Loestrin Fe " to [phone]              Co-pay card must be run in second position  "other coverage code 3"  if denied d/t PA, step edit, or insurance denial    Order  Specific Question:   Supervising Provider    Answer:   Florian Buff [[id number]]    Follow-up: Return in about 1 week (around 10/20/2020) for for 2hr sugar test and bp nurse check; 26mh for med f/u.   No orders of the defined types were placed in this encounter.    I provided 15 minutes of non-face-to-face time during this encounter.  KBajandas [facility]2/18/2022 1:07 PM

## 2020-10-20 ENCOUNTER — Encounter: Payer: Self-pay | Admitting: Obstetrics and Gynecology

## 2020-10-20 ENCOUNTER — Ambulatory Visit (INDEPENDENT_AMBULATORY_CARE_PROVIDER_SITE_OTHER): Payer: Medicaid Other | Admitting: Obstetrics and Gynecology

## 2020-10-20 ENCOUNTER — Other Ambulatory Visit: Payer: Self-pay

## 2020-10-20 DIAGNOSIS — Z8759 Personal history of other complications of pregnancy, childbirth and the puerperium: Secondary | ICD-10-CM

## 2020-10-20 NOTE — Progress Notes (Signed)
Agree with A & P. 

## 2020-10-20 NOTE — Progress Notes (Signed)
   NURSE VISIT- BLOOD PRESSURE CHECK  SUBJECTIVE:  Kaitlin Klein is a 25 y.o. G17P1001 female here for BP check. She is postpartum, delivery date 09/07/20    HYPERTENSION ROS:  Pregnant/postpartum:  . Severe headaches that don't go away with tylenol/other medicines: No  . Visual changes (seeing spots/double/blurred vision) No  . Severe pain under right breast breast or in center of upper chest No  . Severe nausea/vomiting No  . Taking medicines as instructed not applicable  OBJECTIVE:  BP 122/80 (BP Location: Right Arm, Patient Position: Sitting, Cuff Size: Normal)   Pulse 86   Wt 198 lb 12.8 oz (90.2 kg)   LMP 10/11/2020 (Exact Date)   Breastfeeding No   BMI 32.09 kg/m   Appearance alert, well appearing, and in no distress, oriented to person, place, and time and normal appearing weight.  ASSESSMENT: Postpartum  blood pressure check  PLAN: Discussed with Dr. Alysia Penna   Recommendations: no changes needed   Follow-up: as scheduled   Aliahna Statzer A Windy Dudek  10/20/2020 9:45 AM

## 2020-10-24 ENCOUNTER — Other Ambulatory Visit: Payer: Self-pay

## 2020-10-24 ENCOUNTER — Ambulatory Visit (INDEPENDENT_AMBULATORY_CARE_PROVIDER_SITE_OTHER): Payer: Medicaid Other | Admitting: *Deleted

## 2020-10-24 ENCOUNTER — Other Ambulatory Visit: Payer: Medicaid Other

## 2020-10-24 DIAGNOSIS — Z8632 Personal history of gestational diabetes: Secondary | ICD-10-CM

## 2020-10-24 DIAGNOSIS — Z8759 Personal history of other complications of pregnancy, childbirth and the puerperium: Secondary | ICD-10-CM

## 2020-10-24 DIAGNOSIS — Z131 Encounter for screening for diabetes mellitus: Secondary | ICD-10-CM

## 2020-10-24 DIAGNOSIS — O165 Unspecified maternal hypertension, complicating the puerperium: Secondary | ICD-10-CM | POA: Insufficient documentation

## 2020-10-24 MED ORDER — AMLODIPINE BESYLATE 5 MG PO TABS: 5.0000 mg | ORAL_TABLET | Freq: Every day | ORAL | 0 refills | Status: DC

## 2020-10-24 NOTE — Progress Notes (Addendum)
   NURSE VISIT- BLOOD PRESSURE CHECK  SUBJECTIVE:  Kaitlin Klein is a 25 y.o. G58P1001 female here for BP check. She is postpartum, delivery date 09/07/20    HYPERTENSION ROS:  Pregnant/postpartum:  . Severe headaches that don't go away with tylenol/other medicines: No  . Visual changes (seeing spots/double/blurred vision) No  . Severe pain under right breast breast or in center of upper chest No  . Severe nausea/vomiting No  . Taking medicines as instructed not applicable   OBJECTIVE:  1st bp: 144/88 BP 131/84 (BP Location: Right Arm, Patient Position: Sitting, Cuff Size: Normal)   Pulse 99   LMP 10/11/2020 (Exact Date)   Appearance alert, well appearing, and in no distress and oriented to person, place, and time.  ASSESSMENT: Postpartum  blood pressure check  PLAN: Discussed with Joellyn Haff, CNM, [facility]   Recommendations: new prescription will be sent   Follow-up: in 4 weeks   Jobe Marker  10/24/2020 10:26 AM  Chart reviewed for nurse visit. Agree with plan of care. Rx norvasc 5mg . Stop 2d before next bp check visit in 4wks. , Cheral Marker 10/24/2020 10:48 AM

## 2020-10-24 NOTE — Addendum Note (Signed)
Addended by: Shawna Clamp R on: 10/24/2020 12:26 PM   Modules accepted: Orders

## 2020-10-25 LAB — GLUCOSE TOLERANCE, 2 HOURS W/ 1HR
Glucose, 1 hour: 138 mg/dL (ref 65–179)
Glucose, 2 hour: 93 mg/dL (ref 65–152)
Glucose, Fasting: 83 mg/dL (ref 65–91)

## 2020-11-21 ENCOUNTER — Other Ambulatory Visit: Payer: Self-pay

## 2020-11-21 ENCOUNTER — Ambulatory Visit (INDEPENDENT_AMBULATORY_CARE_PROVIDER_SITE_OTHER): Payer: Medicaid Other

## 2020-11-21 DIAGNOSIS — Z8759 Personal history of other complications of pregnancy, childbirth and the puerperium: Secondary | ICD-10-CM

## 2020-11-21 MED ORDER — AMLODIPINE BESYLATE 5 MG PO TABS: 5.0000 mg | ORAL_TABLET | Freq: Every day | ORAL | 1 refills | Status: DC

## 2020-11-21 NOTE — Progress Notes (Addendum)
   NURSE VISIT- BLOOD PRESSURE CHECK  SUBJECTIVE:  Kaitlin Klein is a 25 y.o. G26P1001 female here for BP check. She is postpartum, delivery date 09/07/20    HYPERTENSION ROS:  Pregnant/postpartum:  . Severe headaches that don't go away with tylenol/other medicines: No  . Visual changes (seeing spots/double/blurred vision) No  . Severe pain under right breast breast or in center of upper chest No  . Severe nausea/vomiting No  . Taking medicines as instructed yes  OBJECTIVE:  BP 129/81 (BP Location: Right Arm, Patient Position: Sitting, Cuff Size: Normal)   Pulse 86   Ht 5\' 6"  (1.676 m)   Wt 206 lb 12.8 oz (93.8 kg)   LMP 11/14/2020 (Approximate)   Breastfeeding No   BMI 33.38 kg/m   Appearance alert, well appearing, and in no distress, oriented to person, place, and time and normal appearing weight.  ASSESSMENT: Postpartum  blood pressure check  PLAN: Discussed with 11/16/2020, CNM, [facility]   Recommendations: Stop BP meds 1 week prior to appt   Follow-up: by 5/18   Chrsitopher Wik A Alicia Seib  11/21/2020 4:36 PM   Chart reviewed for nurse visit. Agree with plan of care. Did not stop bp meds 2d prior to today. Will need to have f/u ~5/18 for 04-11-1995 f/u on COCs, so stop norvasc 1wk prior to this visit. Norvasc refilled for now.  , Cheral Marker 11/21/2020 5:03 PM

## 2020-11-21 NOTE — Addendum Note (Signed)
Addended by: Cheral Marker on: 11/21/2020 05:04 PM   Modules accepted: Orders

## 2021-01-09 ENCOUNTER — Ambulatory Visit: Payer: Medicaid Other | Admitting: Women's Health

## 2021-07-12 ENCOUNTER — Telehealth: Payer: Self-pay | Admitting: Obstetrics & Gynecology

## 2021-07-12 NOTE — Telephone Encounter (Signed)
Pt had + preg test at [facility] on 06/29/2021  Pt states her LMP was 04/13/2021, had a neg home preg test mid Sept, and a + home preg test mid October  Please advise

## 2021-07-13 ENCOUNTER — Other Ambulatory Visit: Payer: Medicaid Other

## 2021-07-14 ENCOUNTER — Encounter: Payer: Self-pay | Admitting: Obstetrics & Gynecology

## 2021-07-14 LAB — BETA HCG QUANT (REF LAB): hCG Quant: 67090 m[IU]/mL

## 2021-07-16 ENCOUNTER — Telehealth: Payer: Self-pay | Admitting: Women's Health

## 2021-07-16 ENCOUNTER — Other Ambulatory Visit: Payer: Self-pay | Admitting: Obstetrics & Gynecology

## 2021-07-16 DIAGNOSIS — O3680X0 Pregnancy with inconclusive fetal viability, not applicable or unspecified: Secondary | ICD-10-CM

## 2021-07-16 DIAGNOSIS — Z363 Encounter for antenatal screening for malformations: Secondary | ICD-10-CM

## 2021-07-16 NOTE — Telephone Encounter (Signed)
Patient wanted somebody to tell her how to read her lab results that shows how far along in her pregnancy she is.

## 2021-07-17 ENCOUNTER — Other Ambulatory Visit: Payer: Self-pay

## 2021-07-17 ENCOUNTER — Ambulatory Visit (INDEPENDENT_AMBULATORY_CARE_PROVIDER_SITE_OTHER): Payer: Medicaid Other

## 2021-07-17 ENCOUNTER — Other Ambulatory Visit: Payer: Self-pay | Admitting: Obstetrics & Gynecology

## 2021-07-17 ENCOUNTER — Encounter: Payer: Self-pay | Admitting: Obstetrics & Gynecology

## 2021-07-17 ENCOUNTER — Other Ambulatory Visit: Payer: Medicaid Other

## 2021-07-17 DIAGNOSIS — O3680X0 Pregnancy with inconclusive fetal viability, not applicable or unspecified: Secondary | ICD-10-CM | POA: Diagnosis not present

## 2021-07-17 DIAGNOSIS — Z3682 Encounter for antenatal screening for nuchal translucency: Secondary | ICD-10-CM

## 2021-07-17 DIAGNOSIS — Z3A11 11 weeks gestation of pregnancy: Secondary | ICD-10-CM

## 2021-07-17 DIAGNOSIS — Z3481 Encounter for supervision of other normal pregnancy, first trimester: Secondary | ICD-10-CM

## 2021-07-19 LAB — INTEGRATED 1
Crown Rump Length: 50 mm
Gest. Age on Collection Date: 11.6 weeks
Maternal Age at EDD: 24.2 yr
Nuchal Translucency (NT): 1.1 mm
Number of Fetuses: 1
PAPP-A Value: 160.7 ng/mL
Weight: 213 [lb_av]

## 2021-07-19 LAB — CBC/D/PLT+RPR+RH+ABO+RUBIGG...
Antibody Screen: NEGATIVE
Basophils Absolute: 0.1 10*3/uL (ref 0.0–0.2)
Basos: 0 %
EOS (ABSOLUTE): 0.1 10*3/uL (ref 0.0–0.4)
Eos: 1 %
HCV Ab: 0.2 s/co ratio (ref 0.0–0.9)
HIV Screen 4th Generation wRfx: NONREACTIVE
Hematocrit: 38 % (ref 34.0–46.6)
Hemoglobin: 13.6 g/dL (ref 11.1–15.9)
Hepatitis B Surface Ag: NEGATIVE
Immature Grans (Abs): 0 10*3/uL (ref 0.0–0.1)
Immature Granulocytes: 0 %
Lymphocytes Absolute: 4.4 10*3/uL — ABNORMAL HIGH (ref 0.7–3.1)
Lymphs: 28 %
MCH: 31.2 pg (ref 26.6–33.0)
MCHC: 35.8 g/dL — ABNORMAL HIGH (ref 31.5–35.7)
MCV: 87 fL (ref 79–97)
Monocytes Absolute: 0.6 10*3/uL (ref 0.1–0.9)
Monocytes: 4 %
Neutrophils Absolute: 10.6 10*3/uL — ABNORMAL HIGH (ref 1.4–7.0)
Neutrophils: 67 %
Platelets: 267 10*3/uL (ref 150–450)
RBC: 4.36 x10E6/uL (ref 3.77–5.28)
RDW: 13.1 % (ref 11.7–15.4)
RPR Ser Ql: NONREACTIVE
Rh Factor: POSITIVE
Rubella Antibodies, IGG: 5.69 index (ref >0.99)
WBC: 15.8 10*3/uL — ABNORMAL HIGH (ref 3.4–10.8)

## 2021-07-19 LAB — HEMOGLOBIN A1C
Est. average glucose Bld gHb Est-mCnc: 103 mg/dL
Hgb A1c MFr Bld: 5.2 % (ref 4.8–5.6)

## 2021-07-19 LAB — HCV INTERPRETATION

## 2021-07-26 ENCOUNTER — Telehealth: Payer: Self-pay | Admitting: Women's Health

## 2021-07-26 NOTE — Telephone Encounter (Signed)
Pt calling to see if we have received all of her lab results (any results including gender & genetics)  Please advise & notify pt

## 2021-08-06 ENCOUNTER — Ambulatory Visit (INDEPENDENT_AMBULATORY_CARE_PROVIDER_SITE_OTHER): Payer: Medicaid Other | Admitting: Women's Health

## 2021-08-06 ENCOUNTER — Other Ambulatory Visit: Payer: Self-pay

## 2021-08-06 ENCOUNTER — Encounter: Payer: Self-pay | Admitting: Women's Health

## 2021-08-06 ENCOUNTER — Ambulatory Visit: Payer: Medicaid Other | Admitting: *Deleted

## 2021-08-06 VITALS — BP 119/69 | HR 89 | Wt 217.0 lb

## 2021-08-06 DIAGNOSIS — Z8759 Personal history of other complications of pregnancy, childbirth and the puerperium: Secondary | ICD-10-CM

## 2021-08-06 DIAGNOSIS — O0990 Supervision of high risk pregnancy, unspecified, unspecified trimester: Secondary | ICD-10-CM | POA: Diagnosis not present

## 2021-08-06 DIAGNOSIS — Z3A14 14 weeks gestation of pregnancy: Secondary | ICD-10-CM

## 2021-08-06 DIAGNOSIS — Z3482 Encounter for supervision of other normal pregnancy, second trimester: Secondary | ICD-10-CM

## 2021-08-06 DIAGNOSIS — Z113 Encounter for screening for infections with a predominantly sexual mode of transmission: Secondary | ICD-10-CM

## 2021-08-06 DIAGNOSIS — Z1379 Encounter for other screening for genetic and chromosomal anomalies: Secondary | ICD-10-CM | POA: Diagnosis not present

## 2021-08-06 DIAGNOSIS — Z8632 Personal history of gestational diabetes: Secondary | ICD-10-CM

## 2021-08-06 DIAGNOSIS — Z3490 Encounter for supervision of normal pregnancy, unspecified, unspecified trimester: Secondary | ICD-10-CM | POA: Insufficient documentation

## 2021-08-06 LAB — POCT URINALYSIS DIPSTICK OB
Blood, UA: NEGATIVE
Glucose, UA: NEGATIVE
Ketones, UA: NEGATIVE
Leukocytes, UA: NEGATIVE
Nitrite, UA: NEGATIVE
POC,PROTEIN,UA: NEGATIVE

## 2021-08-06 MED ORDER — ASPIRIN 81 MG PO TBEC: 162.0000 mg | DELAYED_RELEASE_TABLET | Freq: Every day | ORAL | 2 refills | Status: DC

## 2021-08-06 NOTE — Patient Instructions (Signed)
Kaitlin Klein, thank you for choosing our office today! We appreciate the opportunity to meet your healthcare needs. You may receive a short survey by mail, e-mail, or through [employer]. If you are happy with your care we would appreciate if you could take just a few minutes to complete the survey questions. We read all of your comments and take your feedback very seriously. Thank you again for choosing our office.  Center for [employer] Team at [facility] at [facility] ([address] [place], [place] [address]) Entrance C, located off of [address] Free 24/7 valet parking  Go to [email or web address] to register for FREE online childbirth classes  Call the office [phone]) or go to [facility] if: You begin to severe cramping Your water breaks.  Sometimes it is a big gush of fluid, sometimes it is just a trickle that keeps getting your panties wet or running down your legs You have vaginal bleeding.  It is normal to have a small amount of spotting if your cervix was checked.   [facility] Pediatricians/Family Doctors [facility]): [address] Colette Ribas, [phone]           [facility]: [address], [phone]                [facility]): [address], [phone] (call to ask if accepting patients) [facility] Department: [address], [place], [phone]    [facility] Pediatricians/Family Doctors [facility]): [phone] [address], [phone] [facility]: [address] [place], [phone] [facility]: [address], [phone]  [facility] Doctors  [facility]): [phone] [facility]: [address], [phone]   [facility] Doctors [facility]: [address], [phone]  [facility] Doctors  [facility]: [phone], [phone]  Home Blood Pressure Monitoring for Patients   Your provider has recommended that you check your blood pressure (BP) at least once a week at home. If you do not have a blood pressure cuff at home, one will be provided for you. Contact your provider if you have not received your monitor within 1 week.   Helpful Tips for Accurate Home Blood Pressure Checks  Don't smoke, exercise, or drink caffeine 30 minutes before checking your BP Use the restroom before checking your BP (a full bladder can raise your pressure) Relax in a comfortable upright chair Feet on the ground Left arm resting comfortably on a flat surface at the level of your heart Legs uncrossed Back supported Sit quietly and don't talk Place the cuff on your bare arm Adjust snuggly, so that only two fingertips can fit between your skin and the top of the cuff Check 2 readings separated by at least one minute Keep a log of your BP readings For a visual, please reference this diagram: http://ccnc.care/bpdiagram  Provider Name: [facility]     Phone: [phone]  Zone 1: ALL CLEAR  Continue to monitor your symptoms:  BP reading is less than 140 (top number) or less than 90 (bottom number)  No right upper stomach pain No headaches or seeing spots No feeling nauseated or throwing up No swelling in face and hands  Zone 2: CAUTION Call your doctor's office for any of the following:  BP reading is greater than 140 (top number) or greater than  90 (bottom number)  Stomach pain under your ribs in the middle or right side Headaches or seeing spots Feeling nauseated or throwing up Swelling in face and hands  Zone 3: EMERGENCY  Seek immediate medical care if you have any of the following:  BP reading is greater than160 (top number) or greater than 110 (bottom number) Severe headaches not improving with Tylenol Serious difficulty catching your breath Any worsening symptoms from  Zone 2     Second Trimester of Pregnancy The second trimester is from week 14 through week 27 (months 4 through 6). The second trimester is often a time when you feel your best. Your body has adjusted to being pregnant, and you begin to feel better physically. Usually, morning sickness has lessened or quit completely, you may have more energy, and you may have an increase in appetite. The second trimester is also a time when the fetus is growing rapidly. At the end of the sixth month, the fetus is about 9 inches long and weighs about 1 pounds. You will likely begin to feel the baby move (quickening) between 16 and 20 weeks of pregnancy. Body changes during your second trimester Your body continues to go through many changes during your second trimester. The changes vary from woman to woman. Your weight will continue to increase. You will notice your lower abdomen bulging out. You may begin to get stretch marks on your hips, abdomen, and breasts. You may develop headaches that can be relieved by medicines. The medicines should be approved by your health care provider. You may urinate more often because the fetus is pressing on your bladder. You may develop or continue to have heartburn as a result of your pregnancy. You may develop constipation because certain hormones are causing the muscles that push waste through your intestines to slow down. You may develop hemorrhoids or swollen, bulging veins (varicose veins). You may have back pain. This is caused by: Weight gain. Pregnancy hormones that are relaxing the joints in your pelvis. A shift in weight and the muscles that support your balance. Your breasts will continue to grow and they will continue to become tender. Your gums may bleed and may be sensitive to brushing and flossing. Dark spots or blotches (chloasma, mask of pregnancy) may develop on your face. This will likely fade after the baby is born. A dark line from your belly button to  the pubic area (linea nigra) may appear. This will likely fade after the baby is born. You may have changes in your hair. These can include thickening of your hair, rapid growth, and changes in texture. Some women also have hair loss during or after pregnancy, or hair that feels dry or thin. Your hair will most likely return to normal after your baby is born.  What to expect at prenatal visits During a routine prenatal visit: You will be weighed to make sure you and the fetus are growing normally. Your blood pressure will be taken. Your abdomen will be measured to track your baby's growth. The fetal heartbeat will be listened to. Any test results from the previous visit will be discussed.  Your health care provider may ask you: How you are feeling. If you are feeling the baby move. If you have had any abnormal symptoms, such as leaking fluid, bleeding, severe headaches, or abdominal cramping. If you are using any tobacco products, including cigarettes, chewing tobacco, and electronic cigarettes. If you have any questions.  Other tests that may be performed during   your second trimester include: Blood tests that check for: Low iron levels (anemia). High blood sugar that affects pregnant women (gestational diabetes) between 24 and 28 weeks. Rh antibodies. This is to check for a protein on red blood cells (Rh factor). Urine tests to check for infections, diabetes, or protein in the urine. An ultrasound to confirm the proper growth and development of the baby. An amniocentesis to check for possible genetic problems. Fetal screens for spina bifida and Down syndrome. HIV (human immunodeficiency virus) testing. Routine prenatal testing includes screening for HIV, unless you choose not to have this test.  Follow these instructions at home: Medicines Follow your health care provider's instructions regarding medicine use. Specific medicines may be either safe or unsafe to take during  pregnancy. Take a prenatal vitamin that contains at least 600 micrograms (mcg) of folic acid. If you develop constipation, try taking a stool softener if your health care provider approves. Eating and drinking Eat a balanced diet that includes fresh fruits and vegetables, whole grains, good sources of protein such as meat, eggs, or tofu, and low-fat dairy. Your health care provider will help you determine the amount of weight gain that is right for you. Avoid raw meat and uncooked cheese. These carry germs that can cause birth defects in the baby. If you have low calcium intake from food, talk to your health care provider about whether you should take a daily calcium supplement. Limit foods that are high in fat and processed sugars, such as fried and sweet foods. To prevent constipation: Drink enough fluid to keep your urine clear or pale yellow. Eat foods that are high in fiber, such as fresh fruits and vegetables, whole grains, and beans. Activity Exercise only as directed by your health care provider. Most women can continue their usual exercise routine during pregnancy. Try to exercise for 30 minutes at least 5 days a week. Stop exercising if you experience uterine contractions. Avoid heavy lifting, wear low heel shoes, and practice good posture. A sexual relationship may be continued unless your health care provider directs you otherwise. Relieving pain and discomfort Wear a good support bra to prevent discomfort from breast tenderness. Take warm sitz baths to soothe any pain or discomfort caused by hemorrhoids. Use hemorrhoid cream if your health care provider approves. Rest with your legs elevated if you have leg cramps or low back pain. If you develop varicose veins, wear support hose. Elevate your feet for 15 minutes, 3-4 times a day. Limit salt in your diet. Prenatal Care Write down your questions. Take them to your prenatal visits. Keep all your prenatal visits as told by your health  care provider. This is important. Safety Wear your seat belt at all times when driving. Make a list of emergency phone numbers, including numbers for family, friends, the hospital, and police and fire departments. General instructions Ask your health care provider for a referral to a local prenatal education class. Begin classes no later than the beginning of month 6 of your pregnancy. Ask for help if you have counseling or nutritional needs during pregnancy. Your health care provider can offer advice or refer you to specialists for help with various needs. Do not use hot tubs, steam rooms, or saunas. Do not douche or use tampons or scented sanitary pads. Do not cross your legs for long periods of time. Avoid cat litter boxes and soil used by cats. These carry germs that can cause birth defects in the baby and possibly loss of the   fetus by miscarriage or stillbirth. Avoid all smoking, herbs, alcohol, and unprescribed drugs. Chemicals in these products can affect the formation and growth of the baby. Do not use any products that contain nicotine or tobacco, such as cigarettes and e-cigarettes. If you need help quitting, ask your health care provider. Visit your dentist if you have not gone yet during your pregnancy. Use a soft toothbrush to brush your teeth and be gentle when you floss. Contact a health care provider if: You have dizziness. You have mild pelvic cramps, pelvic pressure, or nagging pain in the abdominal area. You have persistent nausea, vomiting, or diarrhea. You have a bad smelling vaginal discharge. You have pain when you urinate. Get help right away if: You have a fever. You are leaking fluid from your vagina. You have spotting or bleeding from your vagina. You have severe abdominal cramping or pain. You have rapid weight gain or weight loss. You have shortness of breath with chest pain. You notice sudden or extreme swelling of your face, hands, ankles, feet, or legs. You  have not felt your baby move in over an hour. You have severe headaches that do not go away when you take medicine. You have vision changes. Summary The second trimester is from week 14 through week 27 (months 4 through 6). It is also a time when the fetus is growing rapidly. Your body goes through many changes during pregnancy. The changes vary from woman to woman. Avoid all smoking, herbs, alcohol, and unprescribed drugs. These chemicals affect the formation and growth your baby. Do not use any tobacco products, such as cigarettes, chewing tobacco, and e-cigarettes. If you need help quitting, ask your health care provider. Contact your health care provider if you have any questions. Keep all prenatal visits as told by your health care provider. This is important. This information is not intended to replace advice given to you by your health care provider. Make sure you discuss any questions you have with your health care provider. Document Released: 08/06/2001 Document Revised: 01/18/2016 Document Reviewed: 10/13/2012 Elsevier Interactive Patient Education  2017 Elsevier Inc.  

## 2021-08-06 NOTE — Progress Notes (Signed)
INITIAL OBSTETRICAL VISIT Patient name: Kaitlin Klein MRN [id number]  Date of birth: 05/21/1996 Chief Complaint:   Initial Prenatal Visit  History of Present Illness:   Kaitlin Klein is a 25 y.o. G28P1001 African-American female at [redacted]w[redacted]d by [place] at 40 weeks with an Estimated Date of Delivery: 01/31/22 being seen today for her initial obstetrical visit.   Patient's last menstrual period was 04/13/2021 (approximate). Her obstetrical history is significant for  term SVB, IOL for GHTN/A1DM, on meds for PPHTN .   Today she reports no complaints.  Last pap 03/09/20. Results were: NILM w/ HRHPV not done  Depression screen [facility] 2/9 08/06/2021 07/07/2020 06/15/2020 04/20/2020 03/09/2020  Decreased Interest 2 0 3 2 2   Down, Depressed, Hopeless 1 0 3 3 3   PHQ - 2 Score 3 0 6 5 5   Altered sleeping 1 - 2 2 2   Tired, decreased energy 3 - 3 3 2   Change in appetite 2 - 0 3 3  Feeling bad or failure about yourself  0 - 0 3 2  Trouble concentrating 0 - 0 0 0  Moving slowly or fidgety/restless 0 - 0 0 0  Suicidal thoughts 0 - 0 0 1  PHQ-9 Score 9 - 11 16 15   Difficult doing work/chores - - Not difficult at all Very difficult Very difficult     GAD 7 : Generalized Anxiety Score 08/06/2021 06/15/2020 04/20/2020 03/09/2020  Nervous, Anxious, on Edge 1 3 2 2   Control/stop worrying 0 0 2 3  Worry too much - different things 0 0 3 3  Trouble relaxing 2 3 2 2   Restless 0 0 0 0  Easily annoyed or irritable 3 3 3 3   Afraid - awful might happen 0 0 0 1  Total GAD 7 Score 6 9 12 14   Anxiety Difficulty - Not difficult at all Very difficult -     Review of Systems:   Pertinent items are noted in HPI Denies cramping/contractions, leakage of fluid, vaginal bleeding, abnormal vaginal discharge w/ itching/odor/irritation, headaches, visual changes, shortness of breath, chest pain, abdominal pain, severe nausea/vomiting, or problems with urination or bowel movements unless otherwise stated above.  Pertinent  History Reviewed:  Reviewed past medical,surgical, social, obstetrical and family history.  Reviewed problem list, medications and allergies. OB History  Gravida Para Term Preterm AB Living  2 1 1     1   SAB IAB Ectopic Multiple Live Births        0 1    # Outcome Date GA Lbr Len/2nd Weight Sex Delivery Anes PTL Lv  2 Current           1 Term 09/07/20 [redacted]w[redacted]d 08:32 / 01:51 6 lb 12.6 oz (3.079 kg) F Vag-Spont EPI  LIV   Physical Assessment:   Vitals:   08/06/21 1020  BP: 119/69  Pulse: 89  Weight: 217 lb (98.4 kg)  Body mass index is 35.02 kg/m.       Physical Examination:  General appearance - well appearing, and in no distress  Mental status - alert, oriented to person, place, and time  Psych:  She has a normal mood and affect  Skin - warm and dry, normal color, no suspicious lesions noted  Chest - effort normal, all lung fields clear to auscultation bilaterally  Heart - normal rate and regular rhythm  Abdomen - soft, nontender  Extremities:  No swelling or varicosities noted  Chaperone: N/A    TODAY'S FHR: 148 via doppler  Results for orders placed or performed in visit on 08/06/21 (from the past 24 hour(s))  POC Urinalysis Dipstick OB   Collection Time: 08/06/21 10:42 AM  Result Value Ref Range   Color, UA     Clarity, UA     Glucose, UA Negative Negative   Bilirubin, UA     Ketones, UA neg    Spec Grav, UA     Blood, UA neg    pH, UA     POC,PROTEIN,UA Negative Negative, Trace, Small (1+), Moderate (2+), Large (3+), 4+   Urobilinogen, UA     Nitrite, UA neg    Leukocytes, UA Negative Negative   Appearance     Odor      Assessment & Plan:  1) Low-Risk Pregnancy G2P1001 at [redacted]w[redacted]d with an Estimated Date of Delivery: 01/31/22   2) Initial OB visit  3) H/O GHTN & PPHTN> ASA 162mg , P:C today, CMP next visit w/ 2nd IT  4) H/O DM & A1DM> DM prior to gastric sleeve, then resolved. A1DM during last pregnancy. 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 Swallow whole.    Dispense:  180 tablet    Refill:  2    Order Specific Question:   Supervising Provider    Answer:   07/19/21 H [[id number]]    Initial labs obtained Continue prenatal vitamins Reviewed n/v relief measures and warning s/s to report Reviewed recommended weight gain based on pre-gravid BMI Encouraged well-balanced diet Genetic & carrier screening discussed: requests Panorama and NT/IT, had Horizon last pregnancy Ultrasound discussed; fetal survey: requested CCNC completed> form faxed if has or is planning to apply for medicaid The nature of [facility] with multiple MDs and other Advanced Practice Providers was explained to patient; also emphasized that fellows, residents, and students are part of our team. Does have home bp cuff. Office bp cuff given: no. Rx sent: n/a. Check bp weekly, let [employer] know if consistently >140/90.   Follow-up: Return in about 3 weeks (around 08/27/2021) for LROB, CNM, in person.   Orders Placed This Encounter  Procedures   GC/Chlamydia Probe Amp   Urine Culture   Pain Management Screening Profile (10S)   Genetic Screening   Protein / creatinine ratio, urine   POC Urinalysis Dipstick OB    10/25/2021 CNM, [facility] 08/06/2021 11:06 AM

## 2021-08-07 ENCOUNTER — Encounter: Payer: Self-pay | Admitting: Women's Health

## 2021-08-07 DIAGNOSIS — F1290 Cannabis use, unspecified, uncomplicated: Secondary | ICD-10-CM | POA: Insufficient documentation

## 2021-08-07 LAB — PMP SCREEN PROFILE (10S), URINE
Amphetamine Scrn, Ur: NEGATIVE ng/mL
BARBITURATE SCREEN URINE: NEGATIVE ng/mL
BENZODIAZEPINE SCREEN, URINE: NEGATIVE ng/mL
CANNABINOIDS UR QL SCN: POSITIVE ng/mL — AB
Cocaine (Metab) Scrn, Ur: NEGATIVE ng/mL
Creatinine(Crt), U: 101.9 mg/dL (ref 20.0–300.0)
Methadone Screen, Urine: NEGATIVE ng/mL
OXYCODONE+OXYMORPHONE UR QL SCN: NEGATIVE ng/mL
Opiate Scrn, Ur: NEGATIVE ng/mL
Ph of Urine: 8.2 (ref 4.5–8.9)
Phencyclidine Qn, Ur: NEGATIVE ng/mL
Propoxyphene Scrn, Ur: NEGATIVE ng/mL

## 2021-08-07 LAB — PROTEIN / CREATININE RATIO, URINE
Creatinine, Urine: 88.3 mg/dL
Protein, Ur: 14.3 mg/dL
Protein/Creat Ratio: 162 mg/g creat (ref 0–200)

## 2021-08-08 LAB — GC/CHLAMYDIA PROBE AMP
Chlamydia trachomatis, NAA: NEGATIVE
Neisseria Gonorrhoeae by PCR: NEGATIVE

## 2021-08-11 LAB — URINE CULTURE

## 2021-08-13 ENCOUNTER — Encounter: Payer: Self-pay | Admitting: Women's Health

## 2021-08-13 MED ORDER — NITROFURANTOIN MONOHYD MACRO 100 MG PO CAPS: 100.0000 mg | ORAL_CAPSULE | Freq: Two times a day (BID) | ORAL | 0 refills | Status: DC

## 2021-08-13 NOTE — Addendum Note (Signed)
Addended by: Shawna Clamp R on: 08/13/2021 03:07 PM   Modules accepted: Orders

## 2021-08-14 ENCOUNTER — Encounter: Payer: Self-pay | Admitting: Women's Health

## 2021-08-15 ENCOUNTER — Encounter: Payer: Self-pay | Admitting: Women's Health

## 2021-08-21 ENCOUNTER — Encounter: Payer: Self-pay | Admitting: Women's Health

## 2021-08-26 NOTE — L&D Delivery Note (Signed)
OB/GYN Faculty Practice Delivery Note  Kaitlin Klein is a 26 y.o. G2P1001 s/p VD at [redacted]w[redacted]d. She was admitted for spontaneous onset of labor.   ROM: 2h 48m with clear fluid GBS Status: Positive/-- (05/16 1500) Maximum Maternal Temperature: 98.83F  Labor Progress: Initial SVE: 4.5/80/-3. She then progressed to complete with assistance of AROM.   Delivery Date/Time: [phone] Delivery: Called to room and patient was complete and pushing. Head delivered ROA. No nuchal cord present. Required gentle downward pressure to deliver the anterior shoulder and then was able to hook bilateral axilla to deliver remainder of body. Infant with spontaneous cry, placed on mother's abdomen, dried and stimulated. Cord clamped x 2 after 1-minute delay, and cut by FOB. Cord blood drawn. Placenta delivered spontaneously with gentle cord traction. Fundus firm with massage and Pitocin. Labia, perineum, vagina, and cervix inspected with small first degree perineal, right labial, and small peri-urethral laceration. The 1st degree and labial were repaired with 3.0 vicryl and 4.0 Monocryl respectively.   Baby Weight: pending  Placenta: 3 vessel, intact. Sent to L&D Complications: None Lacerations: 1st degree, right labial  EBL: 150 mL Analgesia: Epidural   Infant:  APGAR (1 MIN): 7   APGAR (5 MINS): [place], DO  OB Family Medicine Fellow, [facility] for [employer], [place] [facility] 01/18/2022, 3:13 PM

## 2021-08-28 ENCOUNTER — Encounter: Payer: Medicaid Other | Admitting: Women's Health

## 2021-09-01 ENCOUNTER — Emergency Department (HOSPITAL_COMMUNITY)
Admission: EM | Admit: 2021-09-01 | Discharge: 2021-09-02 | Disposition: A | Discharge status: Home / Self Care | Payer: Medicaid Other | Attending: Emergency Medicine | Admitting: Emergency Medicine

## 2021-09-01 ENCOUNTER — Encounter (HOSPITAL_COMMUNITY): Payer: Self-pay | Admitting: *Deleted

## 2021-09-01 DIAGNOSIS — Z3A18 18 weeks gestation of pregnancy: Secondary | ICD-10-CM | POA: Insufficient documentation

## 2021-09-01 DIAGNOSIS — F331 Major depressive disorder, recurrent, moderate: Secondary | ICD-10-CM | POA: Diagnosis not present

## 2021-09-01 DIAGNOSIS — R45851 Suicidal ideations: Secondary | ICD-10-CM | POA: Diagnosis not present

## 2021-09-01 DIAGNOSIS — Y900 Blood alcohol level of less than 20 mg/100 ml: Secondary | ICD-10-CM | POA: Diagnosis not present

## 2021-09-01 DIAGNOSIS — O26892 Other specified pregnancy related conditions, second trimester: Secondary | ICD-10-CM | POA: Insufficient documentation

## 2021-09-01 DIAGNOSIS — Z7982 Long term (current) use of aspirin: Secondary | ICD-10-CM | POA: Diagnosis not present

## 2021-09-01 DIAGNOSIS — D72829 Elevated white blood cell count, unspecified: Secondary | ICD-10-CM | POA: Diagnosis not present

## 2021-09-01 HISTORY — DX: Encounter for supervision of normal pregnancy, unspecified, unspecified trimester: Z34.90

## 2021-09-01 LAB — PREGNANCY, URINE: Preg Test, Ur: POSITIVE — AB

## 2021-09-01 LAB — COMPREHENSIVE METABOLIC PANEL
ALT: 13 U/L (ref 0–44)
AST: 14 U/L — ABNORMAL LOW (ref 15–41)
Albumin: 3.5 g/dL (ref 3.5–5.0)
Alkaline Phosphatase: 43 U/L (ref 38–126)
Anion gap: 9 (ref 5–15)
BUN: 10 mg/dL (ref 6–20)
CO2: 22 mmol/L (ref 22–32)
Calcium: 8.8 mg/dL — ABNORMAL LOW (ref 8.9–10.3)
Chloride: 105 mmol/L (ref 98–111)
Creatinine, Ser: 0.43 mg/dL — ABNORMAL LOW (ref 0.44–1.00)
GFR, Estimated: >60 mL/min (ref >60)
Glucose, Bld: 129 mg/dL — ABNORMAL HIGH (ref 70–99)
Potassium: 3.5 mmol/L (ref 3.5–5.1)
Sodium: 136 mmol/L (ref 135–145)
Total Bilirubin: 0.4 mg/dL (ref 0.3–1.2)
Total Protein: 7 g/dL (ref 6.5–8.1)

## 2021-09-01 LAB — RAPID URINE DRUG SCREEN, HOSP PERFORMED
Amphetamines: NOT DETECTED
Barbiturates: NOT DETECTED
Benzodiazepines: NOT DETECTED
Cocaine: NOT DETECTED
Opiates: NOT DETECTED
Tetrahydrocannabinol: NOT DETECTED

## 2021-09-01 LAB — CBC WITH DIFFERENTIAL/PLATELET
Abs Immature Granulocytes: 0.12 10*3/uL — ABNORMAL HIGH (ref 0.00–0.07)
Basophils Absolute: 0.1 10*3/uL (ref 0.0–0.1)
Basophils Relative: 0 %
Eosinophils Absolute: 0.1 10*3/uL (ref 0.0–0.5)
Eosinophils Relative: 1 %
HCT: 34.6 % — ABNORMAL LOW (ref 36.0–46.0)
Hemoglobin: 11.9 g/dL — ABNORMAL LOW (ref 12.0–15.0)
Immature Granulocytes: 1 %
Lymphocytes Relative: 20 %
Lymphs Abs: 2.9 10*3/uL (ref 0.7–4.0)
MCH: 31.4 pg (ref 26.0–34.0)
MCHC: 34.4 g/dL (ref 30.0–36.0)
MCV: 91.3 fL (ref 80.0–100.0)
Monocytes Absolute: 0.7 10*3/uL (ref 0.1–1.0)
Monocytes Relative: 5 %
Neutro Abs: 10.7 10*3/uL — ABNORMAL HIGH (ref 1.7–7.7)
Neutrophils Relative %: 73 %
Platelets: 268 10*3/uL (ref 150–400)
RBC: 3.79 MIL/uL — ABNORMAL LOW (ref 3.87–5.11)
RDW: 14.4 % (ref 11.5–15.5)
WBC: 14.5 10*3/uL — ABNORMAL HIGH (ref 4.0–10.5)
nRBC: 0 % (ref 0.0–0.2)

## 2021-09-01 LAB — ETHANOL: Alcohol, Ethyl (B): <10 mg/dL (ref <10)

## 2021-09-01 MED ORDER — FLINTSTONES GUMMIES COMPLETE PO CHEW
1.0000 | CHEWABLE_TABLET | Freq: Every day | ORAL | Status: DC
Start: 2021-09-02 — End: 2021-09-02

## 2021-09-01 MED ORDER — LORATADINE 10 MG PO TABS: 10.0000 mg | ORAL_TABLET | Freq: Every day | ORAL | Status: DC

## 2021-09-01 MED ORDER — ACETAMINOPHEN 325 MG PO TABS
650.0000 mg | ORAL_TABLET | ORAL | Status: DC | PRN
  Administered 2021-09-02: 650 mg via ORAL
  Filled 2021-09-01: qty 2

## 2021-09-01 NOTE — ED Triage Notes (Signed)
States she wants to admit herself due to depression, state she is suicidal

## 2021-09-01 NOTE — ED Provider Notes (Signed)
[facility] EMERGENCY DEPARTMENT Provider Note   CSN: [id number] Arrival date & time: 09/01/21  1700     History  Chief Complaint  Patient presents with   V70.1    Kaitlin Klein is a 26 y.o. female with a prior history of depression, suicidal ideation and reported suicide attempt when she was a teenager presenting for evaluation of worsening depression and fleeting suicidal thoughts without specific plan.  Patient is currently [redacted] weeks pregnant with her second child, she reports a normal intrauterine pregnancy under the care of [facility].  She does report increased stressors regarding finances and work, along with her growing family but she states is contributing to her current depression.  She used to be on antidepressant medication and is motivated to seek treatment at this time before her symptoms get worse.  She denies any physical complaints at this time, denies vaginal bleeding.  Denies abdominal or pelvic pain, no nausea or vomiting, had some morning sickness early in her pregnancy which has resolved.  The history is provided by the patient.      Home Medications Prior to Admission medications   Medication Sig Start Date End Date Taking? Authorizing Provider  acetaminophen (TYLENOL) 325 MG tablet Take 2 tablets (650 mg total) by mouth every 4 (four) hours as needed for mild pain, moderate pain, fever or headache (for pain scale < 4). 09/09/20  Yes Tamala Julian, [place], CNM  aspirin 81 MG EC tablet Take 2 tablets (162 mg total) by mouth daily. Swallow whole. 08/06/21  Yes Roma Schanz, CNM  loratadine (CLARITIN) 10 MG tablet Take 10 mg by mouth daily.   Yes [provider]  Pediatric Multivit-Minerals-C (FLINTSTONES GUMMIES COMPLETE) CHEW Chew 1 tablet by mouth daily.   Yes [provider]  Blood Pressure Monitoring (BLOOD PRESSURE CUFF) MISC 1 kit by Does not apply route daily. 03/09/20   Cresenzo-Dishmon, Joaquim Lai, CNM  nitrofurantoin,  macrocrystal-monohydrate, (MACROBID) 100 MG capsule Take 1 capsule (100 mg total) by mouth 2 (two) times daily. X 7 days Patient not taking: Reported on 09/01/2021 08/13/21   Roma Schanz, CNM      Allergies    Patient has no known allergies.    Review of Systems   Review of Systems  Constitutional:  Negative for fever.  HENT:  Negative for congestion and sore throat.   Eyes: Negative.   Respiratory:  Negative for chest tightness and shortness of breath.   Cardiovascular:  Negative for chest pain.  Gastrointestinal:  Negative for abdominal pain, nausea and vomiting.  Genitourinary: Negative.  Negative for pelvic pain and vaginal bleeding.  Musculoskeletal:  Negative for arthralgias, joint swelling and neck pain.  Skin: Negative.  Negative for rash and wound.  Neurological:  Negative for dizziness, weakness, light-headedness, numbness and headaches.  Psychiatric/Behavioral:  Positive for dysphoric mood and suicidal ideas.    Physical Exam Updated Vital Signs BP (!) 126/48 (BP Location: Left Arm)    Pulse 95    Temp 98.7 F (37.1 C) (Oral)    Resp 18    LMP 04/13/2021 (Approximate)    SpO2 96%  Physical Exam Vitals and nursing note reviewed.  Constitutional:      General: She is not in acute distress.    Appearance: She is well-developed.     Comments: Conversive and cooperative.  No acute distress.  HENT:     Head: Normocephalic and atraumatic.  Eyes:     Conjunctiva/sclera: Conjunctivae normal.  Cardiovascular:  Rate and Rhythm: Normal rate and regular rhythm.     Heart sounds: Normal heart sounds.  Pulmonary:     Effort: Pulmonary effort is normal.     Breath sounds: Normal breath sounds. No wheezing.  Abdominal:     General: Bowel sounds are normal.     Palpations: Abdomen is soft.     Tenderness: There is no abdominal tenderness.  Musculoskeletal:        General: Normal range of motion.     Cervical back: Normal range of motion.  Skin:    General: Skin is  warm and dry.  Neurological:     General: No focal deficit present.     Mental Status: She is alert and oriented to person, place, and time.  Psychiatric:        Attention and Perception: Attention normal.        Mood and Affect: Affect normal.        Speech: Speech normal.        Behavior: Behavior normal.        Thought Content: Thought content includes suicidal ideation. Thought content does not include suicidal plan.        Cognition and Memory: Cognition normal.   Fetal heart rate 141. ED Results / Procedures / Treatments   Labs (all labs ordered are listed, but only abnormal results are displayed) Labs Reviewed  CBC WITH DIFFERENTIAL/PLATELET - Abnormal; Notable for the following components:      Result Value   WBC 14.5 (*)    RBC 3.79 (*)    Hemoglobin 11.9 (*)    HCT 34.6 (*)    Neutro Abs 10.7 (*)    Abs Immature Granulocytes 0.12 (*)    All other components within normal limits  COMPREHENSIVE METABOLIC PANEL - Abnormal; Notable for the following components:   Glucose, Bld 129 (*)    Creatinine, Ser 0.43 (*)    Calcium 8.8 (*)    AST 14 (*)    All other components within normal limits  PREGNANCY, URINE - Abnormal; Notable for the following components:   Preg Test, Ur POSITIVE (*)    All other components within normal limits  RESP PANEL BY RT-PCR (FLU A&B, COVID) ARPGX2  RAPID URINE DRUG SCREEN, HOSP PERFORMED  ETHANOL    EKG None  Radiology No results found.  Procedures Procedures    Medications Ordered in ED Medications - No data to display  ED Course/ Medical Decision Making/ A&P                           Medical Decision Making  This patient presents to the ED for chief complaint of suicidal ideation without Lann, this involves an extensive number of treatment options. The differential diagnosis includes depression, suicidal ideation, other psychiatric illness.  Co morbidities that complicate the patient evaluation  Current  pregnancy  Additional history obtained:  Additional history obtained from patient's record   Lab Tests:  I Ordered, and personally interpreted labs.  The pertinent results include: CBC, c-Met rapid urine drug screen.  Findings are unremarkable.  She does have an elevated white blood cell count at 14.5, this is potentially secondary to her pregnant state.  She has no physical complaints that would suggest an infection.  Imaging Studies ordered:  No imaging studies were indicated  Medicines ordered and prescription drug management:  I ordered medication including chronic home meds pending psych clearance.   Consultations Obtained:  I requested consultation with TTS,  and discussed lab and imaging findings as well as pertinent plan -pending TTS psych evaluation currently. Reevaluation:   Disposition:  After consideration of the diagnostic results and the patients response to treatment, I feel that the most appropriate treatment course includes evaluation by TTS.  Patient has been medically cleared for behavioral health consult.         Final Clinical Impression(s) / ED Diagnoses Final diagnoses:  Suicidal ideation    Rx / DC Orders ED Discharge Orders     None         Landis Martins 09/01/21 2351    Milton Ferguson, MD 09/02/21 1114

## 2021-09-01 NOTE — ED Notes (Signed)
Pt dressed out, wanded when pt placed in [address].

## 2021-09-02 MED ORDER — ADULT MULTIVITAMIN W/MINERALS CH: 1.0000 | ORAL_TABLET | Freq: Every day | ORAL | Status: DC
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
## 2021-09-02 NOTE — ED Provider Notes (Signed)
Behavioral health recommendations have been reviewed.  Patient does not meet inpatient criteria and has been referred for outpatient therapy.  Will discharge patient.   Gilda Crease, MD 09/02/21 [phone]

## 2021-09-10 ENCOUNTER — Other Ambulatory Visit: Payer: Self-pay

## 2021-09-10 ENCOUNTER — Encounter: Payer: Self-pay | Admitting: Women's Health

## 2021-09-10 ENCOUNTER — Ambulatory Visit (INDEPENDENT_AMBULATORY_CARE_PROVIDER_SITE_OTHER): Payer: Medicaid Other | Admitting: Women's Health

## 2021-09-10 VITALS — BP 110/64 | HR 108 | Wt 229.0 lb

## 2021-09-10 DIAGNOSIS — Z363 Encounter for antenatal screening for malformations: Secondary | ICD-10-CM

## 2021-09-10 DIAGNOSIS — Z3482 Encounter for supervision of other normal pregnancy, second trimester: Secondary | ICD-10-CM

## 2021-09-10 DIAGNOSIS — B9620 Unspecified Escherichia coli [E. coli] as the cause of diseases classified elsewhere: Secondary | ICD-10-CM

## 2021-09-10 DIAGNOSIS — F418 Other specified anxiety disorders: Secondary | ICD-10-CM

## 2021-09-10 DIAGNOSIS — Z8759 Personal history of other complications of pregnancy, childbirth and the puerperium: Secondary | ICD-10-CM

## 2021-09-10 DIAGNOSIS — N390 Urinary tract infection, site not specified: Secondary | ICD-10-CM

## 2021-09-10 DIAGNOSIS — Z1379 Encounter for other screening for genetic and chromosomal anomalies: Secondary | ICD-10-CM

## 2021-09-10 MED ORDER — SERTRALINE HCL 25 MG PO TABS: 25.0000 mg | ORAL_TABLET | Freq: Every day | ORAL | 6 refills | Status: DC

## 2021-09-10 NOTE — Patient Instructions (Addendum)
Kaitlin Klein, thank you for choosing our office today! We appreciate the opportunity to meet your healthcare needs. You may receive a short survey by mail, e-mail, or through [employer]. If you are happy with your care we would appreciate if you could take just a few minutes to complete the survey questions. We read all of your comments and take your feedback very seriously. Thank you again for choosing our office.  Center for [facility] Team at [facility] at [facility] ([address] [place], [place] [address]) Entrance C, located off of [address] Free 24/7 valet parking   [facility]  [address] [place], [place] [address] Phone: [phone] Hours: Mon-Fri 8AM-5PM  If you're a walk-in patient there is generally a wait time involved on a "first come, first served basis" otherwise contact us beforehand to setup an appointment. If you're in crisis please use our 24-Hour Crisis Hotline for immediate access to a clinician. 24 Hour Crisis Line: [phone]   If this is your first time please bring the following with you: Insurance/Medicaid Card ID or Social Security Card Any referral documentation  Payment Options Individuals with Medicaid have no obligation for a copay. Individuals with Medicare or private insurance will be obligated to meet their policy's requirement(s). Individuals who are uninsured will be eligible for a sliding or discounted scaled as defined by the relevant Managed Care Organization/Local Management Entity  Services:  Substance Abuse Outpatient Treatment Mental Health Outpatient Treatment Psychiatric Services/ Medical Services Advanced Access/Walk-In Clinic Mobile Crisis Management Services ACTT (Assertive Community Treatment Team) Dialectic Behavioral Therapy Critical Time Intervention Psychosocial Rehabilitation [facility](PSR) Medication Assisted Treatment  Go to [email or web address] to register for FREE online childbirth classes  Call  the office [phone]) or go to [facility] if: You begin to severe cramping Your water breaks.  Sometimes it is a big gush of fluid, sometimes it is just a trickle that keeps getting your panties wet or running down your legs You have vaginal bleeding.  It is normal to have a small amount of spotting if your cervix was checked.   [facility]/Family Doctors [facility]): [address], [phone]           [facility]: [address], [phone]                [facility]): [address], [phone] (call to ask if accepting patients) [facility]: [address], [place], [phone]    [facility]Family Doctors [facility]): [phone], [phone] [facility]: [address], [phone] [facility]: [address], [phone]  [facility]  [facility]): [phone] [facility]: [address], [phone]   [facility]: [address], [phone]  [facility]  [facility]: [phone]  [phone], [phone]  Home Blood Pressure Monitoring for Patients   Your provider has recommended that you check your blood pressure (BP) at least once a week at home. If you do not have a blood pressure cuff at home, one will be provided for you. Contact your provider if you have not received your monitor within 1 week.   Helpful Tips for Accurate Home Blood Pressure Checks  Don't smoke, exercise, or drink caffeine 30 minutes before checking your BP Use the restroom before  checking your BP (a full bladder can raise your pressure) Relax in a comfortable upright chair Feet on the ground Left arm resting comfortably on a flat surface at the level of your  heart Legs uncrossed Back supported Sit quietly and don't talk Place the cuff on your bare arm Adjust snuggly, so that only two fingertips can fit between your skin and the top of the cuff Check 2 readings separated by at least one minute Keep a log of your BP readings For a visual, please reference this diagram: http://ccnc.care/bpdiagram  Provider Name: [facility]     Phone: [phone]  Zone 1: ALL CLEAR  Continue to monitor your symptoms:  BP reading is less than 140 (top number) or less than 90 (bottom number)  No right upper stomach pain No headaches or seeing spots No feeling nauseated or throwing up No swelling in face and hands  Zone 2: CAUTION Call your doctor's office for any of the following:  BP reading is greater than 140 (top number) or greater than 90 (bottom number)  Stomach pain under your ribs in the middle or right side Headaches or seeing spots Feeling nauseated or throwing up Swelling in face and hands  Zone 3: EMERGENCY  Seek immediate medical care if you have any of the following:  BP reading is greater than160 (top number) or greater than 110 (bottom number) Severe headaches not improving with Tylenol Serious difficulty catching your breath Any worsening symptoms from Zone 2     Second Trimester of Pregnancy The second trimester is from week 14 through week 27 (months 4 through 6). The second trimester is often a time when you feel your best. Your body has adjusted to being pregnant, and you begin to feel better physically. Usually, morning sickness has lessened or quit completely, you may have more energy, and you may have an increase in appetite. The second trimester is also a time when the fetus is growing rapidly. At the end of the sixth month, the fetus is about 9 inches long and weighs about 1 pounds. You will likely begin to feel the baby move (quickening) between 16 and 20 weeks of pregnancy. Body changes during your second  trimester Your body continues to go through many changes during your second trimester. The changes vary from woman to woman. Your weight will continue to increase. You will notice your lower abdomen bulging out. You may begin to get stretch marks on your hips, abdomen, and breasts. You may develop headaches that can be relieved by medicines. The medicines should be approved by your health care provider. You may urinate more often because the fetus is pressing on your bladder. You may develop or continue to have heartburn as a result of your pregnancy. You may develop constipation because certain hormones are causing the muscles that push waste through your intestines to slow down. You may develop hemorrhoids or swollen, bulging veins (varicose veins). You may have back pain. This is caused by: Weight gain. Pregnancy hormones that are relaxing the joints in your pelvis. A shift in weight and the muscles that support your balance. Your breasts will continue to grow and they will continue to become tender. Your gums may bleed and may be sensitive to brushing and flossing. Dark spots or blotches (chloasma, mask of pregnancy) may develop on your face. This will likely fade after the baby is born. A dark line from your belly button to the pubic area (linea nigra) may appear. This will likely  fade after the baby is born. You may have changes in your hair. These can include thickening of your hair, rapid growth, and changes in texture. Some women also have hair loss during or after pregnancy, or hair that feels dry or thin. Your hair will most likely return to normal after your baby is born.  What to expect at prenatal visits During a routine prenatal visit: You will be weighed to make sure you and the fetus are growing normally. Your blood pressure will be taken. Your abdomen will be measured to track your baby's growth. The fetal heartbeat will be listened to. Any test results from the previous  visit will be discussed.  Your health care provider may ask you: How you are feeling. If you are feeling the baby move. If you have had any abnormal symptoms, such as leaking fluid, bleeding, severe headaches, or abdominal cramping. If you are using any tobacco products, including cigarettes, chewing tobacco, and electronic cigarettes. If you have any questions.  Other tests that may be performed during your second trimester include: Blood tests that check for: Low iron levels (anemia). High blood sugar that affects pregnant women (gestational diabetes) between 11 and 28 weeks. Rh antibodies. This is to check for a protein on red blood cells (Rh factor). Urine tests to check for infections, diabetes, or protein in the urine. An ultrasound to confirm the proper growth and development of the baby. An amniocentesis to check for possible genetic problems. Fetal screens for spina bifida and Down syndrome. HIV (human immunodeficiency virus) testing. Routine prenatal testing includes screening for HIV, unless you choose not to have this test.  Follow these instructions at home: Medicines Follow your health care provider's instructions regarding medicine use. Specific medicines may be either safe or unsafe to take during pregnancy. Take a prenatal vitamin that contains at least 600 micrograms (mcg) of folic acid. If you develop constipation, try taking a stool softener if your health care provider approves. Eating and drinking Eat a balanced diet that includes fresh fruits and vegetables, whole grains, good sources of protein such as meat, eggs, or tofu, and low-fat dairy. Your health care provider will help you determine the amount of weight gain that is right for you. Avoid raw meat and uncooked cheese. These carry germs that can cause birth defects in the baby. If you have low calcium intake from food, talk to your health care provider about whether you should take a daily calcium  supplement. Limit foods that are high in fat and processed sugars, such as fried and sweet foods. To prevent constipation: Drink enough fluid to keep your urine clear or pale yellow. Eat foods that are high in fiber, such as fresh fruits and vegetables, whole grains, and beans. Activity Exercise only as directed by your health care provider. Most women can continue their usual exercise routine during pregnancy. Try to exercise for 30 minutes at least 5 days a week. Stop exercising if you experience uterine contractions. Avoid heavy lifting, wear low heel shoes, and practice good posture. A sexual relationship may be continued unless your health care provider directs you otherwise. Relieving pain and discomfort Wear a good support bra to prevent discomfort from breast tenderness. Take warm sitz baths to soothe any pain or discomfort caused by hemorrhoids. Use hemorrhoid cream if your health care provider approves. Rest with your legs elevated if you have leg cramps or low back pain. If you develop varicose veins, wear support hose. Elevate your feet for 15  minutes, 3-4 times a day. Limit salt in your diet. Prenatal Care Write down your questions. Take them to your prenatal visits. Keep all your prenatal visits as told by your health care provider. This is important. Safety Wear your seat belt at all times when driving. Make a list of emergency phone numbers, including numbers for family, friends, the hospital, and police and fire departments. General instructions Ask your health care provider for a referral to a local prenatal education class. Begin classes no later than the beginning of month 6 of your pregnancy. Ask for help if you have counseling or nutritional needs during pregnancy. Your health care provider can offer advice or refer you to specialists for help with various needs. Do not use hot tubs, steam rooms, or saunas. Do not douche or use tampons or scented sanitary pads. Do not  cross your legs for long periods of time. Avoid cat litter boxes and soil used by cats. These carry germs that can cause birth defects in the baby and possibly loss of the fetus by miscarriage or stillbirth. Avoid all smoking, herbs, alcohol, and unprescribed drugs. Chemicals in these products can affect the formation and growth of the baby. Do not use any products that contain nicotine or tobacco, such as cigarettes and e-cigarettes. If you need help quitting, ask your health care provider. Visit your dentist if you have not gone yet during your pregnancy. Use a soft toothbrush to brush your teeth and be gentle when you floss. Contact a health care provider if: You have dizziness. You have mild pelvic cramps, pelvic pressure, or nagging pain in the abdominal area. You have persistent nausea, vomiting, or diarrhea. You have a bad smelling vaginal discharge. You have pain when you urinate. Get help right away if: You have a fever. You are leaking fluid from your vagina. You have spotting or bleeding from your vagina. You have severe abdominal cramping or pain. You have rapid weight gain or weight loss. You have shortness of breath with chest pain. You notice sudden or extreme swelling of your face, hands, ankles, feet, or legs. You have not felt your baby move in over an hour. You have severe headaches that do not go away when you take medicine. You have vision changes. Summary The second trimester is from week 14 through week 27 (months 4 through 6). It is also a time when the fetus is growing rapidly. Your body goes through many changes during pregnancy. The changes vary from woman to woman. Avoid all smoking, herbs, alcohol, and unprescribed drugs. These chemicals affect the formation and growth your baby. Do not use any tobacco products, such as cigarettes, chewing tobacco, and e-cigarettes. If you need help quitting, ask your health care provider. Contact your health care provider if  you have any questions. Keep all prenatal visits as told by your health care provider. This is important. This information is not intended to replace advice given to you by your health care provider. Make sure you discuss any questions you have with your health care provider. Document Released: 08/06/2001 Document Revised: 01/18/2016 Document Reviewed: 10/13/2012 Elsevier Interactive Patient Education  2017 [employer]

## 2021-09-10 NOTE — Progress Notes (Signed)
LOW-RISK PREGNANCY VISIT Patient name: Kaitlin Klein MRN [id number]  Date of birth: [birth year] Chief Complaint:   Routine Prenatal Visit (2nd IT today)  History of Present Illness:   Kaitlin Klein is a 26 y.o. G51P1001 female at [redacted]w[redacted]d with an Estimated Date of Delivery: 01/31/22 being seen today for ongoing management of a low-risk pregnancy.   Today she reports  went to ED 1/7 w/ SI, no plan, was not hospitalized. Has h/o dep/anx, hasn't been on meds since 26yo, wants to restart, doesn't remember what she was on in past. Denies current SI. Also wants to start therapy. . Contractions: Not present. Vag. Bleeding: None.  Movement: Present. denies leaking of fluid.  Depression screen [facility] 2/9 08/06/2021 07/07/2020 06/15/2020 04/20/2020 03/09/2020  Decreased Interest 2 0 3 2 2   Down, Depressed, Hopeless 1 0 3 3 3   PHQ - 2 Score 3 0 6 5 5   Altered sleeping 1 - 2 2 2   Tired, decreased energy 3 - 3 3 2   Change in appetite 2 - 0 3 3  Feeling bad or failure about yourself  0 - 0 3 2  Trouble concentrating 0 - 0 0 0  Moving slowly or fidgety/restless 0 - 0 0 0  Suicidal thoughts 0 - 0 0 1  PHQ-9 Score 9 - 11 16 15   Difficult doing work/chores - - Not difficult at all Very difficult Very difficult     GAD 7 : Generalized Anxiety Score 08/06/2021 06/15/2020 04/20/2020 03/09/2020  Nervous, Anxious, on Edge 1 3 2 2   Control/stop worrying 0 0 2 3  Worry too much - different things 0 0 3 3  Trouble relaxing 2 3 2 2   Restless 0 0 0 0  Easily annoyed or irritable 3 3 3 3   Afraid - awful might happen 0 0 0 1  Total GAD 7 Score 6 9 12 14   Anxiety Difficulty - Not difficult at all Very difficult -      Review of Systems:   Pertinent items are noted in HPI Denies abnormal vaginal discharge w/ itching/odor/irritation, headaches, visual changes, shortness of breath, chest pain, abdominal pain, severe nausea/vomiting, or problems with urination or bowel movements unless otherwise stated  above. Pertinent History Reviewed:  Reviewed past medical,surgical, social, obstetrical and family history.  Reviewed problem list, medications and allergies. Physical Assessment:   Vitals:   09/10/21 1618  BP: 110/64  Pulse: (!) 108  Weight: 229 lb (103.9 kg)  Body mass index is 36.96 kg/m.        Physical Examination:   General appearance: Well appearing, and in no distress  Mental status: Alert, oriented to person, place, and time  Skin: Warm & dry  Cardiovascular: Normal heart rate noted  Respiratory: Normal respiratory effort, no distress  Abdomen: Soft, gravid, nontender  Pelvic: Cervical exam deferred         Extremities: Edema: None  Fetal Status: Fetal Heart Rate (bpm): 150   Movement: Present    Chaperone: N/A   No results found for this or any previous visit (from the past 24 hour(s)).  Assessment & Plan:  1) Low-risk pregnancy G2P1001 at [redacted]w[redacted]d with an Estimated Date of Delivery: 01/31/22   2) Dep/anx w/ recent SI, no current SI, rx zoloft 25mg , understands takes a few weeks to notice improvement, urgent IBH referral and note routed to [facility]. Gave [facility] and 24hr crisis line info   Meds:  Meds ordered this encounter  Medications   sertraline (  ZOLOFT) 25 MG tablet    Sig: Take 1 tablet (25 mg total) by mouth daily.    Dispense:  30 tablet    Refill:  6    Order Specific Question:   Supervising Provider    Answer:   Lazaro Arms [[id number]]   Labs/procedures today: 2nd IT  Plan:  Continue routine obstetrical care  Next visit: prefers will be in person for u/s     Reviewed: Preterm labor symptoms and general obstetric precautions including but not limited to vaginal bleeding, contractions, leaking of fluid and fetal movement were reviewed in detail with the patient.  All questions were answered. Does have home bp cuff. Office bp cuff given: not applicable. Check bp weekly, let us know if consistently >140 and/or >90.  Follow-up: Return for ASAP, ZO:XWRUEAV (no  visit), then 4wks from now for LROB w/ CNM in person.  Future Appointments  Date Time Provider Department Center  10/08/2021  2:30 PM Cresenzo-Dishmon, Scarlette Calico, CNM [facility]    Orders Placed This Encounter  Procedures   Urine Culture   US OB Comp + 14 Wk   INTEGRATED 2   Amb ref to [facility]   Cheral Marker CNM, [facility] 09/10/2021 4:46 PM

## 2021-09-12 LAB — URINE CULTURE

## 2021-09-13 LAB — INTEGRATED 2
AFP MoM: 0.71
Alpha-Fetoprotein: 28.8 ng/mL
Crown Rump Length: 50 mm
DIA MoM: 0.46
DIA Value: 63.2 pg/mL
Estriol, Unconjugated: 2.2 ng/mL
Gest. Age on Collection Date: 11.6 weeks
Gestational Age: 19.4 weeks
Maternal Age at EDD: 24.2 yr
Nuchal Translucency (NT): 1.1 mm
Nuchal Translucency MoM: 0.98
Number of Fetuses: 1
PAPP-A MoM: 0.37
PAPP-A Value: 160.7 ng/mL
Test Results:: NEGATIVE
Weight: 213 [lb_av]
Weight: 229 [lb_av]
hCG MoM: 1.47
hCG Value: 26.5 IU/mL
uE3 MoM: 1.25

## 2021-09-14 NOTE — BH Specialist Note (Signed)
Integrated Behavioral Health via Telemedicine Visit  09/14/2021 Kaitlin Klein [id number]  Number of Integrated Behavioral Health visits: 1 Session Start time: 8:34  Session End time: 9:10 Total time:  36  Referring Provider: Jacklyn Shell, CNM Patient/Family location: [facility] Provider location: [facility]  All persons participating in visit: [name] and [facility]   Types of Service: Individual psychotherapy and Video visit  I connected with Allyne Gee and/or Ammarie S Sturgill's  n/a  via  Telephone or Video Enabled Telemedicine Application  (Video is Caregility application) and verified that I am speaking with the correct person using two identifiers. Discussed confidentiality: Yes   I discussed the limitations of telemedicine and the availability of in person appointments.  Discussed there is a possibility of technology failure and discussed alternative modes of communication if that failure occurs.  I discussed that engaging in this telemedicine visit, they consent to the provision of behavioral healthcare and the services will be billed under their insurance.  Patient and/or legal guardian expressed understanding and consented to Telemedicine visit: Yes   Presenting Concerns: Patient and/or family reports the following symptoms/concerns: Increase in depression and anxiety, attributed to life stress (bills; juggling work from home without childcare; feeling she's let her grandpa down); goals are to prevent depression postpartum and to mend relationship with grandpa.   Duration of problem: Ongoing; Severity of problem: moderate  Patient and/or Family's Strengths/Protective Factors:  Concrete supports in place (healthy food, safe environments, etc.), Sense of purpose, and Physical Health (exercise, healthy diet, medication compliance, etc.) Goals Addressed: Patient will:  Reduce  symptoms of: anxiety, depression, and stress   Increase knowledge and/or ability of: healthy habits   Demonstrate ability to: Increase healthy adjustment to current life circumstances  Progress towards Goals: Ongoing  Interventions: Interventions utilized:  Solution-Focused Strategies and Psychoeducation and/or Health Education Standardized Assessments completed: GAD-7 and PHQ 9  Patient and/or Family Response: Pt agrees with treatment plan  Assessment: Patient currently experiencing Major depressive disorder, recurrent, moderate.   Patient may benefit from psychoeducation and brief therapeutic interventions regarding coping with symptoms of depression, anxiety, life stress .  Plan: Follow up with behavioral health clinician on : Two weeks Behavioral recommendations:  -Continue taking BH medication as prescribed -Continue with plan for outing with family on day off; consider planning weekly outing for overall wellness and self-care -Continue plan to switch to daytime work hours -Consider reaching out to grandpa in the next two weeks, to let him know that you are having a boy  Referral(s): Integrated [employer] (In Clinic)  I discussed the assessment and treatment plan with the patient and/or parent/guardian. They were provided an opportunity to ask questions and all were answered. They agreed with the plan and demonstrated an understanding of the instructions.   They were advised to call back or seek an in-person evaluation if the symptoms worsen or if the condition fails to improve as anticipated.  Rae Lips, LCSW  Depression screen [facility] 2/9 09/20/2021 08/06/2021 07/07/2020 06/15/2020 04/20/2020  Decreased Interest 2 2 0 3 2  Down, Depressed, Hopeless 0 1 0 3 3  PHQ - 2 Score 2 3 0 6 5  Altered sleeping 0 1 - 2 2  Tired, decreased energy 2 3 - 3 3  Change in appetite 1 2 - 0 3  Feeling bad or failure about yourself  3 0 - 0 3  Trouble concentrating 0  0 -  0 0  Moving slowly or fidgety/restless 0 0 - 0 0  Suicidal thoughts 0 0 - 0 0  PHQ-9 Score 8 9 - 11 16  Difficult doing work/chores - - - Not difficult at all Very difficult   GAD 7 : Generalized Anxiety Score 09/20/2021 08/06/2021 06/15/2020 04/20/2020  Nervous, Anxious, on Edge 1 1 3 2   Control/stop worrying 0 0 0 2  Worry too much - different things 1 0 0 3  Trouble relaxing 0 2 3 2   Restless 0 0 0 0  Easily annoyed or irritable 1 3 3 3   Afraid - awful might happen 0 0 0 0  Total GAD 7 Score 3 6 9 12   Anxiety Difficulty - - Not difficult at all Very difficult

## 2021-09-17 ENCOUNTER — Ambulatory Visit (INDEPENDENT_AMBULATORY_CARE_PROVIDER_SITE_OTHER): Payer: Medicaid Other

## 2021-09-17 ENCOUNTER — Other Ambulatory Visit: Payer: Self-pay

## 2021-09-17 DIAGNOSIS — Z363 Encounter for antenatal screening for malformations: Secondary | ICD-10-CM

## 2021-09-17 DIAGNOSIS — Z3482 Encounter for supervision of other normal pregnancy, second trimester: Secondary | ICD-10-CM | POA: Diagnosis not present

## 2021-09-17 DIAGNOSIS — Z3A20 20 weeks gestation of pregnancy: Secondary | ICD-10-CM

## 2021-09-17 NOTE — Progress Notes (Signed)
[place] 20+4 wks,breech/cephalic,FHR 146 bpm,normal ovaries,cx 4.2 cm,subchorionic hemorrhage retroplacental 12.4 x 3.3 cm ,SVP of fluid 5.2 cm,EFW 326 g 18%,anatomy complete

## 2021-09-20 ENCOUNTER — Ambulatory Visit (INDEPENDENT_AMBULATORY_CARE_PROVIDER_SITE_OTHER): Payer: Medicaid Other | Admitting: Clinical

## 2021-09-20 DIAGNOSIS — F331 Major depressive disorder, recurrent, moderate: Secondary | ICD-10-CM | POA: Diagnosis not present

## 2021-09-20 NOTE — Patient Instructions (Signed)
[facility] [address] [place], [place] [address] [phone] (main office) [phone] (Casson Catena's office)  [email or web address]   
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
## 2021-10-05 ENCOUNTER — Ambulatory Visit (INDEPENDENT_AMBULATORY_CARE_PROVIDER_SITE_OTHER): Payer: Medicaid Other | Admitting: Clinical

## 2021-10-05 DIAGNOSIS — Z658 Other specified problems related to psychosocial circumstances: Secondary | ICD-10-CM

## 2021-10-05 DIAGNOSIS — F331 Major depressive disorder, recurrent, moderate: Secondary | ICD-10-CM | POA: Diagnosis not present

## 2021-10-05 NOTE — Patient Instructions (Signed)
Center for [facility] at [facility] [place], [place] [address] [phone] (main office) [phone] ([place] office)  Postpartum Support International (PSI) Online new parent support groups www.postpartum.net   [place] [employer] dss.weddingcandid.com  [place] [address] [place], [place], [address] [phone]   BRAINSTORMING  Develop a Plan Goals: Provide a way to start conversation about your new life with a baby Assist parents in recognizing and using resources within their reach Help pave the way before birth for an easier period of transition afterwards.  Make a list of the following information to keep in a central location: Full name of Mom and Partner: _____________________________________________ 59 full name and Date of Birth: ___________________________________________ Home Address: ___________________________________________________________ ________________________________________________________________________ Home Phone: ____________________________________________________________ Parents' cell numbers: _____________________________________________________ ________________________________________________________________________ Name and contact info for OB: ______________________________________________ Name and contact info for Pediatrician:________________________________________ Contact info for Lactation Consultants: ________________________________________  REST and SLEEP *You each need at least 4-5 hours of uninterrupted sleep every day. Write specific names and contact information.* How are you going to rest in the postpartum period? While partner's home? When partner returns to work? When you both return to work? Where will your baby sleep? Who is available to help during the day? Evening? Night? Who could move in for a period to help support  you? What are some ideas to help you get enough sleep? __________________________________________________________________________________________________________________________________________________________________________________________________________________________________________ NUTRITIOUS FOOD AND DRINK *Plan for meals before your baby is born so you can have healthy food to eat during the immediate postpartum period.* Who will look after breakfast? Lunch? Dinner? List names and contact information. Brainstorm quick, healthy ideas for each meal. What can you do before baby is born to prepare meals for the postpartum period? How can others help you with meals? Which grocery stores provide online shopping and delivery? Which restaurants offer take-out or delivery options? ______________________________________________________________________________________________________________________________________________________________________________________________________________________________________________________________________________________________________________________________________________________________________________________________________  CARE FOR MOM *It's important that mom is cared for and pampered in the postpartum period. Remember, the most important ways new mothers need care are: sleep, nutrition, gentle exercise, and time off.* Who can come take care of mom during this period? Make a list of people with their contact information. List some activities that make you feel cared for, rested, and energized? Who can make sure you have opportunities to do these things? Does mom have a space of her very own within your home that's just for her? Make a [facility] where she can be comfortable, rest, and renew herself  daily. ______________________________________________________________________________________________________________________________________________________________________________________________________________________________________________________________________________________________________________________________________________________________________________________________________    CARE FOR AND FEEDING BABY *Knowledgeable and encouraging people will offer the best support with regard to feeding your baby.* Educate yourself and choose the best feeding option for your baby. Make a list of people who will guide, support, and be a resource for you as your care for and feed your baby. (Friends that have breastfed or are currently breastfeeding, lactation consultants, breastfeeding support groups, etc.) Consider a postpartum doula. (These websites can give you information: dona.org & BuyingShow.es) Seek out local breastfeeding resources like the breastfeeding support group at [employer] or [employer]. ______________________________________________________________________________________________________________________________________________________________________________________________________________________________________________________________________________________________________________________________________________________________________________________________________  Verner Chol AND ERRANDS Who can help with a thorough cleaning before baby is born? Make a list of people who will help with housekeeping and chores, like laundry, light cleaning, dishes, bathrooms, etc. Who can run some errands for you? What can you do to make sure you are stocked with basic supplies before baby is born? Who is going to do the  shopping? ______________________________________________________________________________________________________________________________________________________________________________________________________________________________________________________________________________________________________________________________________________________________________________________________________     Family Adjustment *Nurture yourselvesit helps parents be more loving and allows for better bonding with their child.* What sorts of things do you and partner enjoy doing together? Which activities help you to connect and strengthen your relationship? Make a list of those things. Make a list of people whom you trust to care for your baby so you can have some time together as a couple. What types of things help partner feel connected to Mom? Make a list. What needs will partner have in order to bond with baby? Other children? Who will care for them when you go into labor and while you are in the hospital? Think about what the needs of your older children might be. Who can help you meet those needs? In what ways are you helping them prepare for bringing baby home? List some specific strategies you have for family adjustment. _______________________________________________________________________________________________________________________________________________________________________________________________________________________________________________________________________________________________________________________________________________  SUPPORT *Someone who can empathize with experiences normalizes your problems and makes them more bearable.* Make a list of other friends, neighbors, and/or co-workers you know with infants (and small children, if applicable) with whom you can connect. Make a list of local or online support groups, mom groups, etc. in which you can be  involved. ______________________________________________________________________________________________________________________________________________________________________________________________________________________________________________________________________________________________________________________________________________________________________________________________________  Childcare Plans Investigate and plan for childcare if mom is returning to work. Talk about mom's concerns about her transition back to work. Talk about partner's concerns regarding this transition.  Mental Health *Your mental health is one of the highest priorities for a pregnant or postpartum mom.* 1 in 5 women experience anxiety and/or depression from the time of conception through the first year after birth. Postpartum Mood Disorders are the #1 complication of pregnancy and childbirth and the suffering experienced by these mothers is not necessary! These illnesses are temporary and respond well to treatment, which often includes self-care, social support, talk therapy, and medication when needed. Women experiencing anxiety and depression often say things like: I'm supposed to be happywhy do I feel so sad?, Why can't I snap out of it?, I'm having thoughts that scare me. There is no need to be embarrassed if you are feeling these symptoms: Overwhelmed, anxious, angry, sad, guilty, irritable, hopeless, exhausted but can't sleep You are NOT alone. You are NOT to blame. With help, you WILL be well. Where can I find help? Medical professionals such as your OB, midwife, gynecologist, family practitioner, primary care provider, pediatrician, or mental health providers; [facility] support groups: Feelings After Birth, Breastfeeding Support Group, Baby and Me Group, and Fit 4 Two exercise classes. You have permission to ask for help. It will confirm your feelings, validate your experiences,  share/learn coping strategies, and gain support and encouragement as you heal. You are important! BRAINSTORM Make a list of local resources, including resources for mom and for partner. Identify support groups. Identify people to call late at night - include names and contact info. Talk with partner about perinatal mood and anxiety disorders. Talk with your OB, midwife, and doula about baby blues and about perinatal mood and anxiety disorders. Talk with your pediatrician about perinatal mood and anxiety disorders.   Support & Sanity Savers   What do you really need?  Basics In preparing for a new baby, many expectant parents spend hours shopping for baby clothes, decorating the nursery, and deciding which car seat to buy. Yet most don't think much about what the reality of parenting a newborn will be like, and what they need to make it through that.  So, here is the advice of experienced parents. We know you'll read this, and think they're exaggerating, I don't really need that. Just trust [place] on these, OK? Plan for all of this, and if it turns out you don't need it, come back and teach [place] how you did it!  Must-Haves (Once baby's survival needs are met, make sure you attend to your own survival needs!) Sleep An average newborn sleeps 16-18 hours per day, over 6-7 sleep periods, rarely more than three hours at a time. It is normal and healthy for a newborn to wake throughout the night... but really hard on parents!! Naps. Prioritize sleep above any responsibilities like: cleaning house, visiting friends, running errands, etc.  Sleep whenever baby sleeps. If you can't nap, at least have restful times when baby eats. The more rest you get, the more patient you will be, the more emotionally stable, and better at solving problems.  Food You may not have realized it would be difficult to eat when you have a newborn. Yet, when we talk to countless new parents, they say things like it may be 2:00 pm  when I realize I haven't had breakfast yet. Or every time we sit down to dinner, baby needs to eat, and my food gets cold, so I don't bother to eat it. Finger food. Before your baby is born, stock up with one months' worth of food that: 1) you can eat with one hand while holding a baby, 2) doesn't need to be prepped, 3) is good hot or cold, 4) doesn't spoil when left out for a few hours, and 5) you like to eat. Think about: nuts, dried fruit, Clif bars, pretzels, jerky, gogurt, baby carrots, apples, bananas, crackers, cheez-n-crackers, string cheese, hot pockets or frozen burritos to microwave, garden burgers and breakfast pastries to put in the toaster, yogurt drinks, etc. Restaurant Menus. Make lists of your favorite restaurants & menu items. When family/friends want to help, you can give specific information without much thought. They can either bring you the food or send gift cards for just the right meals. Freezer Meals.  Take some time to make a few meals to put in the freezer ahead of time.  Easy to freeze meals can be anything such as soup, lasagna, chicken pie, or spaghetti sauce. Set up a Meal Schedule.  Ask friends and family to sign up to bring you meals during the first few weeks of being home. (It can be passed around at baby showers!) You have no idea how helpful this will be until you are in the throes of parenting.  https://hamilton-[email or web address]/ is a great website to check out. Emotional Support Know who to call when you're stressed out. Parenting a newborn is very challenging work. There are times when it totally overwhelms your normal coping abilities. EVERY NEW PARENT NEEDS TO HAVE A PLAN FOR WHO TO CALL WHEN THEY JUST CAN'T COPE ANY MORE. (And it has to be someone other than the baby's other parent!) Before your baby is born, come up with at least one person you can call for support - write their phone number down and post it on the refrigerator. Anxiety & Sadness. Baby blues are normal after  pregnancy; however, there are more severe types of anxiety & sadness which can occur and should not be ignored.  They are always treatable, but you have to take the first step by reaching out for help. [facility] offers a Mom Talk group which meets every Tuesday from  10 am - 11 am.  This group is for new moms who need support and connection after their babies are born.  Call [phone].  Really, Really Helpful (Plan for them! Make sure these happen often!!) Physical Support with Taking Care of Yourselves Asking friends and family. Before your baby is born, set up a schedule of people who can come and visit and help out (or ask a friend to schedule for you). Any time someone says let me know what I can do to help, sign them up for a day. When they get there, their job is not to take care of the baby (that's your job and your joy). Their job is to take care of you!  Postpartum doulas. If you don't have anyone you can call on for support, look into postpartum doulas:  professionals at helping parents with caring for baby, caring for themselves, getting breastfeeding started, and helping with household tasks. www.padanc.org is a helpful website for learning about doulas in our area. Peer Support / Parent Groups Why: One of the greatest ideas for new parents is to be around other new parents. Parent groups give you a chance to share and listen to others who are going through the same season of life, get a sense of what is normal infant development by watching several babies learn and grow, share your stories of triumph and struggles with empathetic ears, and forgive your own mistakes when you realize all parents are learning by trial and error. Where to find: There are many places you can meet other new parents throughout our community.  [facility] offers the following classes for new moms and their little ones:  Baby and Me (Birth to [place]) and Breastfeeding Support Group. Go to  www.conehealthybaby.com or call [phone] for more information. Time for your Relationship It's easy to get so caught up in meeting baby's immediate needs that it's hard to find time to connect with your partner, and meet the needs of your relationship. It's also easy to forget what quality time with your partner actually looks like. If you take your baby on a date, you'd be amazed how much of your couple time is spent feeding the baby, diapering the baby, admiring the baby, and talking about the baby. Dating: Try to take time for just the two of you. Babysitter tip: Sometimes when moms are breastfeeding a newborn, they find it hard to figure out how to schedule outings around baby's unpredictable feeding schedules. Have the babysitter come for a three hour period. When she comes over, if baby has just eaten, you can leave right away, and come back in two hours. If baby hasn't fed recently, you start the date at home. Once baby gets hungry and gets a good feeding in, you can head out for the rest of your date time. Date Nights at Home: If you can't get out, at least set aside one evening a week to prioritize your relationship: whenever baby dozes off or doesn't have any immediate needs, spend a little time focusing on each other. Potential conflicts: The main relationship conflicts that come up for new parents are: issues related to sexuality, financial stresses, a feeling of an unfair division of household tasks, and conflicts in parenting styles. The more you can work on these issues before baby arrives, the better!  Fun and Frills (Don't forget these and don't feel guilty for indulging in them!) Everyone has something in life that is a fun little treat that they do just  for themselves. It may be: reading the morning paper, or going for a daily jog, or having coffee with a friend once a week, or going to a movie on Friday nights, or fine chocolates, or bubble baths, or curling up with a good  book. Unless you do fun things for yourself every now and then, it's hard to have the energy for fun with your baby. Whatever your special treats are, make sure you find a way to continue to indulge in them after your baby is born. These special moments can recharge you, and allow you to return to baby with a new joy   PERINATAL MOOD DISORDERS: [place]   _________________________________________Emergency and Crisis Resources If you are an imminent risk to self or others, are experiencing intense personal distress, and/or have noticed significant changes in activities of daily living, call:  [place]: [phone]  [address] [place], [place], [address] Mobile Crisis: [place]: 988 Or visit the following crisis centers: Local Emergency Departments [facility]: [address] [place]. Hours: 8:30AM-5PM. Insurance Accepted: Medicaid, Medicare, and Uninsured.  [facility]:  [address], [place]  Mon-Friday 8am-3pm, [phone]                                                                                  ___________ Non-Crisis Resources To identify specific providers that are covered by your insurance, contact your insurance company or local agencies:  [place]: [phone] [facility] [employer]: [place]: [phone] Postpartum Support International- Warm-line: [phone]                                                      __Outpatient Therapy and Medication Management   Providers:  [facility]: [phone] Hours: 9AM-5PM  Insurance Accepted: Alben Spittle, Shane Crutch, [place], [place] [facility]): [phone] Hours: 8AM-5:30PM  nsurance Accepted: All insurances EXCEPT AARP, [place],  [employer], and [place]: [phone] Hours: 8AM-8PM Insurance Accepted: Cristal Ford, Freddrick March, [place], Medicare, [phone] [facility]: [phone] Hours: 8:30AM-7PM Insurance Accepted: Cristal Ford, Medicaid, Medicare, Tricare, [employer] Counseling:  [place] Accepted:  Holland Falling, Lorella Nimrod, [employer], [place]: [phone] Hours: 9AM-5:30PM Insurance Accepted: Alben Spittle, Charlotte Crumb, and Medicaid, Medicare, [facility]:  [phone] Hours: 9am-5pm Insurance Accepted: BCBS; they do not accept Medicaid/Medicare [facility] [place]: [phone] Hours: 9am-9pm Insurance Accepted: All major insurance including Medicaid and Medicare [facility]: [phone] Hours: [place] Accepted: All insurances EXCEPT Medicaid and Medicare. [facility]: [phone]   ____________  Parenting [place]: [phone] [place]:  [place]: (support for children in the NICU and/or with special needs), [phone]   ___________                                                                 Mental Health Support Groups Mental Health Association: [phone]    _____________                                                                                  Online Resources Postpartum Support International: http://jones-berg.com/  800-944-4PPD 2Moms Supporting Moms:  www.momssupportingmoms.net

## 2021-10-08 ENCOUNTER — Other Ambulatory Visit: Payer: Self-pay

## 2021-10-08 ENCOUNTER — Ambulatory Visit (INDEPENDENT_AMBULATORY_CARE_PROVIDER_SITE_OTHER): Payer: Medicaid Other | Admitting: Advanced Practice Midwife

## 2021-10-08 ENCOUNTER — Encounter: Payer: Self-pay | Admitting: Advanced Practice Midwife

## 2021-10-08 VITALS — BP 115/72 | HR 90 | Wt 227.0 lb

## 2021-10-08 DIAGNOSIS — Z3482 Encounter for supervision of other normal pregnancy, second trimester: Secondary | ICD-10-CM

## 2021-10-08 DIAGNOSIS — Z3A23 23 weeks gestation of pregnancy: Secondary | ICD-10-CM

## 2021-10-08 NOTE — Progress Notes (Signed)
° °  LOW-RISK PREGNANCY VISIT Patient name: Kaitlin Klein MRN [id number]  Date of birth: [birth year] Chief Complaint:   Routine Prenatal Visit  History of Present Illness:   Kaitlin Klein is a 26 y.o. G76P1001 female at [redacted]w[redacted]d with an Estimated Date of Delivery: 01/31/22 being seen today for ongoing management of a low-risk pregnancy.  Today she reports no complaints. Contractions: Not present. Vag. Bleeding: None.  Movement: Present. denies leaking of fluid. Review of Systems:   Pertinent items are noted in HPI Denies abnormal vaginal discharge w/ itching/odor/irritation, headaches, visual changes, shortness of breath, chest pain, abdominal pain, severe nausea/vomiting, or problems with urination or bowel movements unless otherwise stated above. Pertinent History Reviewed:  Reviewed past medical,surgical, social, obstetrical and family history.  Reviewed problem list, medications and allergies. Physical Assessment:   Vitals:   10/08/21 1456  BP: 115/72  Pulse: 90  Weight: 227 lb (103 kg)  Body mass index is 36.64 kg/m.        Physical Examination:   General appearance: Well appearing, and in no distress  Mental status: Alert, oriented to person, place, and time  Skin: Warm & dry  Cardiovascular: Normal heart rate noted  Respiratory: Normal respiratory effort, no distress  Abdomen: Soft, gravid, nontender  Pelvic: Cervical exam deferred         Extremities: Edema: None  Fetal Status:     Movement: Present    Chaperone: n/a    No results found for this or any previous visit (from the past 24 hour(s)).  Assessment & Plan:  1) Low-risk pregnancy G2P1001 at [redacted]w[redacted]d with an Estimated Date of Delivery: 01/31/22     Meds: No orders of the defined types were placed in this encounter.  Labs/procedures today: none  Plan:  Continue routine obstetrical care  Next visit: prefers will be in person for PN2     Reviewed: Preterm labor symptoms and general obstetric precautions including  but not limited to vaginal bleeding, contractions, leaking of fluid and fetal movement were reviewed in detail with the patient.  All questions were answered. Has home bp cuff.. Check bp weekly, let us know if >140/90.   Follow-up: Return for 3-5 weeks for pN2/LROB.  No orders of the defined types were placed in this encounter.  Jacklyn Shell DNP, CNM 10/08/2021 4:32 PM

## 2021-10-08 NOTE — Patient Instructions (Signed)

## 2021-10-25 ENCOUNTER — Encounter: Payer: Self-pay | Admitting: Women's Health

## 2021-10-26 ENCOUNTER — Encounter: Payer: Medicaid Other | Admitting: Obstetrics & Gynecology

## 2021-11-05 ENCOUNTER — Other Ambulatory Visit (HOSPITAL_COMMUNITY)
Admission: RE | Admit: 2021-11-05 | Discharge: 2021-11-05 | Disposition: A | Discharge status: Home / Self Care | Payer: Medicaid Other | Source: Ambulatory Visit | Attending: Women's Health | Admitting: Women's Health

## 2021-11-05 ENCOUNTER — Ambulatory Visit (INDEPENDENT_AMBULATORY_CARE_PROVIDER_SITE_OTHER): Payer: Medicaid Other | Admitting: Women's Health

## 2021-11-05 ENCOUNTER — Encounter: Payer: Self-pay | Admitting: Women's Health

## 2021-11-05 ENCOUNTER — Other Ambulatory Visit: Payer: Self-pay

## 2021-11-05 ENCOUNTER — Other Ambulatory Visit: Payer: Medicaid Other

## 2021-11-05 VITALS — BP 122/79 | HR 123 | Wt 225.4 lb

## 2021-11-05 DIAGNOSIS — Z3482 Encounter for supervision of other normal pregnancy, second trimester: Secondary | ICD-10-CM | POA: Diagnosis present

## 2021-11-05 DIAGNOSIS — Z3A27 27 weeks gestation of pregnancy: Secondary | ICD-10-CM

## 2021-11-05 DIAGNOSIS — O418X20 Other specified disorders of amniotic fluid and membranes, second trimester, not applicable or unspecified: Secondary | ICD-10-CM

## 2021-11-05 DIAGNOSIS — N898 Other specified noninflammatory disorders of vagina: Secondary | ICD-10-CM | POA: Diagnosis present

## 2021-11-05 DIAGNOSIS — Z131 Encounter for screening for diabetes mellitus: Secondary | ICD-10-CM

## 2021-11-05 DIAGNOSIS — Z23 Encounter for immunization: Secondary | ICD-10-CM

## 2021-11-05 DIAGNOSIS — O468X2 Other antepartum hemorrhage, second trimester: Secondary | ICD-10-CM

## 2021-11-05 DIAGNOSIS — O24414 Gestational diabetes mellitus in pregnancy, insulin controlled: Secondary | ICD-10-CM

## 2021-11-05 NOTE — Patient Instructions (Signed)
Kaitlin Klein, thank you for choosing our office today! We appreciate the opportunity to meet your healthcare needs. You may receive a short survey by mail, e-mail, or through [employer]. If you are happy with your care we would appreciate if you could take just a few minutes to complete the survey questions. We read all of your comments and take your feedback very seriously. Thank you again for choosing our office.  Center for [employer] Team at [facility]  [facility] ([address] [place], [place] [address]) Entrance C, located off of [address] Free 24/7 valet parking   CLASSES: Go to [email or web address] to register for classes (childbirth, breastfeeding, waterbirth, infant CPR, daddy bootcamp, etc.)  Call the office [phone]) or go to [facility] if: You begin to have strong, frequent contractions Your water breaks.  Sometimes it is a big gush of fluid, sometimes it is just a trickle that keeps getting your panties wet or running down your legs You have vaginal bleeding.  It is normal to have a small amount of spotting if your cervix was checked.  You don't feel your baby moving like normal.  If you don't, get you something to eat and drink and lay down and focus on feeling your baby move.   If your baby is still not moving like normal, you should call the office or go to [facility].  Call the office [phone]) or go to [facility] hospital for these signs of pre-eclampsia: Severe headache that does not go away with Tylenol Visual changes- seeing spots, double, blurred vision Pain under your right breast or upper abdomen that does not go away with Tums or heartburn medicine Nausea and/or vomiting Severe swelling in your hands, feet, and face   Tdap Vaccine It is recommended that you get the Tdap vaccine during the third trimester of EACH pregnancy to help protect your baby from getting pertussis (whooping cough) 27-36 weeks is the BEST time to do  this so that you can pass the protection on to your baby. During pregnancy is better than after pregnancy, but if you are unable to get it during pregnancy it will be offered at the hospital.  You can get this vaccine with [place], at the health department, your family doctor, or some local pharmacies Everyone who will be around your baby should also be up-to-date on their vaccines before the baby comes. Adults (who are not pregnant) only need 1 dose of Tdap during adulthood.   [facility] Pediatricians/Family Doctors [facility]): [address] Colette Ribas, [phone]           [facility]: [address], [phone]                [facility] Medicine [facility]): [address], [phone] (call to ask if accepting patients) [facility] Department: [address], [place], [phone]    [facility] Pediatricians/Family Doctors [facility]): [phone] [address], [phone] [facility]: [address] [place], [phone] [facility]: [address], [phone]  [facility] Doctors  [facility]): [phone] [facility]: [address], [phone]   [facility] Doctors [facility]: [address] [phone]  [facility] Family Doctors  [facility]: [phone], [phone]  Home Blood Pressure Monitoring for Patients   Your provider has recommended that you check your  blood pressure (BP) at least once a week at home. If you do not have a blood pressure cuff at home, one will be provided for you. Contact your provider if you have not received your monitor within 1 week.   Helpful Tips for Accurate Home Blood Pressure Checks  Don't smoke, exercise, or drink caffeine 30 minutes before checking your BP Use the restroom before checking your BP (a full bladder can raise your  pressure) Relax in a comfortable upright chair Feet on the ground Left arm resting comfortably on a flat surface at the level of your heart Legs uncrossed Back supported Sit quietly and don't talk Place the cuff on your bare arm Adjust snuggly, so that only two fingertips can fit between your skin and the top of the cuff Check 2 readings separated by at least one minute Keep a log of your BP readings For a visual, please reference this diagram: http://ccnc.care/bpdiagram  Provider Name: [facility]     Phone: [phone]  Zone 1: ALL CLEAR  Continue to monitor your symptoms:  BP reading is less than 140 (top number) or less than 90 (bottom number)  No right upper stomach pain No headaches or seeing spots No feeling nauseated or throwing up No swelling in face and hands  Zone 2: CAUTION Call your doctor's office for any of the following:  BP reading is greater than 140 (top number) or greater than 90 (bottom number)  Stomach pain under your ribs in the middle or right side Headaches or seeing spots Feeling nauseated or throwing up Swelling in face and hands  Zone 3: EMERGENCY  Seek immediate medical care if you have any of the following:  BP reading is greater than160 (top number) or greater than 110 (bottom number) Severe headaches not improving with Tylenol Serious difficulty catching your breath Any worsening symptoms from Zone 2   Third Trimester of Pregnancy The third trimester is from week 29 through week 42, months 7 through 9. The third trimester is a time when the fetus is growing rapidly. At the end of the ninth month, the fetus is about 20 inches in length and weighs 6-10 pounds.  BODY CHANGES Your body goes through many changes during pregnancy. The changes vary from woman to woman.  Your weight will continue to increase. You can expect to gain 25-35 pounds (11-16 kg) by the end of the pregnancy. You may begin to get stretch marks on your hips, abdomen,  and breasts. You may urinate more often because the fetus is moving lower into your pelvis and pressing on your bladder. You may develop or continue to have heartburn as a result of your pregnancy. You may develop constipation because certain hormones are causing the muscles that push waste through your intestines to slow down. You may develop hemorrhoids or swollen, bulging veins (varicose veins). You may have pelvic pain because of the weight gain and pregnancy hormones relaxing your joints between the bones in your pelvis. Backaches may result from overexertion of the muscles supporting your posture. You may have changes in your hair. These can include thickening of your hair, rapid growth, and changes in texture. Some women also have hair loss during or after pregnancy, or hair that feels dry or thin. Your hair will most likely return to normal after your baby is born. Your breasts will continue to grow and be tender. A yellow discharge may leak from your breasts called colostrum. Your belly button may stick out. You may   feel short of breath because of your expanding uterus. You may notice the fetus "dropping," or moving lower in your abdomen. You may have a bloody mucus discharge. This usually occurs a few days to a week before labor begins. Your cervix becomes thin and soft (effaced) near your due date. WHAT TO EXPECT AT YOUR PRENATAL EXAMS  You will have prenatal exams every 2 weeks until week 36. Then, you will have weekly prenatal exams. During a routine prenatal visit: You will be weighed to make sure you and the fetus are growing normally. Your blood pressure is taken. Your abdomen will be measured to track your baby's growth. The fetal heartbeat will be listened to. Any test results from the previous visit will be discussed. You may have a cervical check near your due date to see if you have effaced. At around 36 weeks, your caregiver will check your cervix. At the same time, your  caregiver will also perform a test on the secretions of the vaginal tissue. This test is to determine if a type of bacteria, Group B streptococcus, is present. Your caregiver will explain this further. Your caregiver may ask you: What your birth plan is. How you are feeling. If you are feeling the baby move. If you have had any abnormal symptoms, such as leaking fluid, bleeding, severe headaches, or abdominal cramping. If you have any questions. Other tests or screenings that may be performed during your third trimester include: Blood tests that check for low iron levels (anemia). Fetal testing to check the health, activity level, and growth of the fetus. Testing is done if you have certain medical conditions or if there are problems during the pregnancy. FALSE LABOR You may feel small, irregular contractions that eventually go away. These are called Braxton Hicks contractions, or false labor. Contractions may last for hours, days, or even weeks before true labor sets in. If contractions come at regular intervals, intensify, or become painful, it is best to be seen by your caregiver.  SIGNS OF LABOR  Menstrual-like cramps. Contractions that are 5 minutes apart or less. Contractions that start on the top of the uterus and spread down to the lower abdomen and back. A sense of increased pelvic pressure or back pain. A watery or bloody mucus discharge that comes from the vagina. If you have any of these signs before the 37th week of pregnancy, call your caregiver right away. You need to go to the hospital to get checked immediately. HOME CARE INSTRUCTIONS  Avoid all smoking, herbs, alcohol, and unprescribed drugs. These chemicals affect the formation and growth of the baby. Follow your caregiver's instructions regarding medicine use. There are medicines that are either safe or unsafe to take during pregnancy. Exercise only as directed by your caregiver. Experiencing uterine cramps is a good sign to  stop exercising. Continue to eat regular, healthy meals. Wear a good support bra for breast tenderness. Do not use hot tubs, steam rooms, or saunas. Wear your seat belt at all times when driving. Avoid raw meat, uncooked cheese, cat litter boxes, and soil used by cats. These carry germs that can cause birth defects in the baby. Take your prenatal vitamins. Try taking a stool softener (if your caregiver approves) if you develop constipation. Eat more high-fiber foods, such as fresh vegetables or fruit and whole grains. Drink plenty of fluids to keep your urine clear or pale yellow. Take warm sitz baths to soothe any pain or discomfort caused by hemorrhoids. Use hemorrhoid cream if  your caregiver approves. If you develop varicose veins, wear support hose. Elevate your feet for 15 minutes, 3-4 times a day. Limit salt in your diet. Avoid heavy lifting, wear low heal shoes, and practice good posture. Rest a lot with your legs elevated if you have leg cramps or low back pain. Visit your dentist if you have not gone during your pregnancy. Use a soft toothbrush to brush your teeth and be gentle when you floss. A sexual relationship may be continued unless your caregiver directs you otherwise. Do not travel far distances unless it is absolutely necessary and only with the approval of your caregiver. Take prenatal classes to understand, practice, and ask questions about the labor and delivery. Make a trial run to the hospital. Pack your hospital bag. Prepare the baby's nursery. Continue to go to all your prenatal visits as directed by your caregiver. SEEK MEDICAL CARE IF: You are unsure if you are in labor or if your water has broken. You have dizziness. You have mild pelvic cramps, pelvic pressure, or nagging pain in your abdominal area. You have persistent nausea, vomiting, or diarrhea. You have a bad smelling vaginal discharge. You have pain with urination. SEEK IMMEDIATE MEDICAL CARE IF:  You  have a fever. You are leaking fluid from your vagina. You have spotting or bleeding from your vagina. You have severe abdominal cramping or pain. You have rapid weight loss or gain. You have shortness of breath with chest pain. You notice sudden or extreme swelling of your face, hands, ankles, feet, or legs. You have not felt your baby move in over an hour. You have severe headaches that do not go away with medicine. You have vision changes. Document Released: 08/06/2001 Document Revised: 08/17/2013 Document Reviewed: 10/13/2012 [facility] Patient Information 2015 [place], [place]. This information is not intended to replace advice given to you by your health care provider. Make sure you discuss any questions you have with your health care provider.

## 2021-11-05 NOTE — Progress Notes (Signed)
? ? ?LOW-RISK PREGNANCY VISIT ?Patient name: Kaitlin Klein MRN [id number]  Date of birth: 1996-02-17 ?Chief Complaint:   ?Routine Prenatal Visit (PN2/ vaginal dryness and itching. Tdap today) ? ?History of Present Illness:   ?Kaitlin Klein is a 26 y.o. G56P1001 female at [redacted]w[redacted]d with an Estimated Date of Delivery: 01/31/22 being seen today for ongoing management of a low-risk pregnancy.  ? ?Today she reports  vaginal itching and dryness, changed soaps and thinks that's what did it . Feels much better on zoloft 25mg , thinks she's at a good dosage. No SI.  Contractions: Not present.  .  Movement: Present. denies leaking of fluid. ? ?Depression screen [facility] 2/9 09/20/2021 08/06/2021 07/07/2020 06/15/2020 04/20/2020  ?Decreased Interest 2 2 0 3 2  ?Down, Depressed, Hopeless 0 1 0 3 3  ?PHQ - 2 Score 2 3 0 6 5  ?Altered sleeping 0 1 - 2 2  ?Tired, decreased energy 2 3 - 3 3  ?Change in appetite 1 2 - 0 3  ?Feeling bad or failure about yourself  3 0 - 0 3  ?Trouble concentrating 0 0 - 0 0  ?Moving slowly or fidgety/restless 0 0 - 0 0  ?Suicidal thoughts 0 0 - 0 0  ?PHQ-9 Score 8 9 - 11 16  ?Difficult doing work/chores - - - Not difficult at all Very difficult  ? ?  ?GAD 7 : Generalized Anxiety Score 09/20/2021 08/06/2021 06/15/2020 04/20/2020  ?Nervous, Anxious, on Edge 1 1 3 2   ?Control/stop worrying 0 0 0 2  ?Worry too much - different things 1 0 0 3  ?Trouble relaxing 0 2 3 2   ?Restless 0 0 0 0  ?Easily annoyed or irritable 1 3 3 3   ?Afraid - awful might happen 0 0 0 0  ?Total GAD 7 Score 3 6 9 12   ?Anxiety Difficulty - - Not difficult at all Very difficult  ? ? ?  ?Review of Systems:   ?Pertinent items are noted in HPI ?Denies abnormal vaginal discharge w/ itching/odor/irritation, headaches, visual changes, shortness of breath, chest pain, abdominal pain, severe nausea/vomiting, or problems with urination or bowel movements unless otherwise stated above. ?Pertinent History Reviewed:  ?Reviewed past medical,surgical, social,  obstetrical and family history.  ?Reviewed problem list, medications and allergies. ?Physical Assessment:  ? ?Vitals:  ? 11/05/21 0935  ?BP: 122/79  ?Pulse: (!) 123  ?Weight: 225 lb 6.4 oz (102.2 kg)  ?Body mass index is 36.38 kg/m?. ?  ?     Physical Examination:  ? General appearance: Well appearing, and in no distress ? Mental status: Alert, oriented to person, place, and time ? Skin: Warm & dry ? Cardiovascular: Normal heart rate noted ? Respiratory: Normal respiratory effort, no distress ? Abdomen: Soft, gravid, nontender ? Pelvic:  spec exam w/ CV swab, clumpy d/c c/w yeast         ? Extremities: Edema: None ? ?Fetal Status: Fetal Heart Rate (bpm): 140 Fundal Height: 26 cm Movement: Present   ? ?Chaperone:   ?No results found for this or any previous visit (from the past 24 hour(s)).  ?Assessment & Plan:  ?1) Low-risk pregnancy G2P1001 at [redacted]w[redacted]d with an Estimated Date of Delivery: 01/31/22  ? 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 ?4) Dep/anx> continue zoloft 25mg /IBH ? ?5) Vaginal itching> CV swab, appears to be yeast, can use otc monistat 7 ? ?6) 12cm retroplacental SCH> w/ EFW 18% @ 20wks, will get EFW  next visit ?  ?Meds: No orders of the defined types were placed in this encounter. ? ?Labs/procedures today: spec exam, CV swab, tdap, and PN2 ? ?Plan:  Continue routine obstetrical care  ?Next visit: prefers in person   ? ?Reviewed: Preterm labor symptoms and general obstetric precautions including but not limited to vaginal bleeding, contractions, leaking of fluid and fetal movement were reviewed in detail with the patient.  All questions were answered. Does have home bp cuff. Office bp cuff given: not applicable. Check bp weekly, let us know if consistently >140 and/or >90. ? ?Follow-up: Return in about 3 weeks (around 11/26/2021) for LROB, US:EFW, MD or CNM, in person. ? ?Future Appointments  ?Date Time Provider Department Center  ?11/27/2021 10:45 AM [facility] [place] [facility] None   ?11/27/2021 11:30 AM Cheral Marker, CNM [facility]  ? ? ?Orders Placed This Encounter  ?Procedures  ? US OB Follow Up  ? Tdap vaccine greater than or equal to 7yo IM  ? ?Cheral Marker CNM, WHNP-BC ?11/05/2021 ?10:13 AM  ?

## 2021-11-06 ENCOUNTER — Other Ambulatory Visit: Payer: Medicaid Other

## 2021-11-06 ENCOUNTER — Encounter: Payer: Self-pay | Admitting: Women's Health

## 2021-11-06 ENCOUNTER — Telehealth: Payer: Self-pay

## 2021-11-06 ENCOUNTER — Other Ambulatory Visit: Payer: Self-pay | Admitting: Women's Health

## 2021-11-06 DIAGNOSIS — O24419 Gestational diabetes mellitus in pregnancy, unspecified control: Secondary | ICD-10-CM | POA: Insufficient documentation

## 2021-11-06 DIAGNOSIS — Z8632 Personal history of gestational diabetes: Secondary | ICD-10-CM | POA: Insufficient documentation

## 2021-11-06 LAB — GLUCOSE TOLERANCE, 2 HOURS W/ 1HR
Glucose, 1 hour: 408 mg/dL — ABNORMAL HIGH (ref 70–179)
Glucose, 2 hour: 344 mg/dL — ABNORMAL HIGH (ref 70–152)
Glucose, Fasting: 202 mg/dL — ABNORMAL HIGH (ref 70–91)

## 2021-11-06 LAB — CBC
Hematocrit: 34.8 % (ref 34.0–46.6)
Hemoglobin: 12 g/dL (ref 11.1–15.9)
MCH: 29.1 pg (ref 26.6–33.0)
MCHC: 34.5 g/dL (ref 31.5–35.7)
MCV: 84 fL (ref 79–97)
Platelets: 257 10*3/uL (ref 150–450)
RBC: 4.13 x10E6/uL (ref 3.77–5.28)
RDW: 14.2 % (ref 11.7–15.4)
WBC: 12.2 10*3/uL — ABNORMAL HIGH (ref 3.4–10.8)

## 2021-11-06 LAB — RPR: RPR Ser Ql: NONREACTIVE

## 2021-11-06 LAB — CERVICOVAGINAL ANCILLARY ONLY
Bacterial Vaginitis (gardnerella): NEGATIVE
Candida Glabrata: NEGATIVE
Candida Vaginitis: POSITIVE — AB
Chlamydia: NEGATIVE
Comment: NEGATIVE
Comment: NEGATIVE
Comment: NEGATIVE
Comment: NEGATIVE
Comment: NEGATIVE
Comment: NORMAL
Neisseria Gonorrhea: NEGATIVE
Trichomonas: NEGATIVE

## 2021-11-06 LAB — ANTIBODY SCREEN: Antibody Screen: NEGATIVE

## 2021-11-06 LAB — HIV ANTIBODY (ROUTINE TESTING W REFLEX): HIV Screen 4th Generation wRfx: NONREACTIVE

## 2021-11-06 MED ORDER — ACCU-CHEK GUIDE ME W/DEVICE KIT: 1.0000 | PACK | Freq: Four times a day (QID) | 0 refills | Status: DC

## 2021-11-06 MED ORDER — ACCU-CHEK SOFTCLIX LANCETS MISC: 99 refills | Status: DC

## 2021-11-06 MED ORDER — ACCU-CHEK GUIDE VI STRP: ORAL_STRIP | 99 refills | Status: DC

## 2021-11-06 MED ORDER — INSULIN NPH (HUMAN) (ISOPHANE) 100 UNIT/ML ~~LOC~~ SUSP: SUBCUTANEOUS | 11 refills | Status: DC

## 2021-11-06 MED ORDER — INSULIN LISPRO (1 UNIT DIAL) 100 UNIT/ML (KWIKPEN): 10.0000 [IU] | PEN_INJECTOR | Freq: Three times a day (TID) | SUBCUTANEOUS | 11 refills | Status: DC

## 2021-11-06 NOTE — Addendum Note (Signed)
Addended by: Dorita Sciara, Donnivan Villena A on: 11/06/2021 11:31 AM ? ? Modules accepted: Orders ? ?

## 2021-11-06 NOTE — Telephone Encounter (Signed)
Called pt per Knute Neu to relay test results and information concerning GDM. Two identifiers used. Pt had previous GDM and understood about checking blood sugars, but had not given herself insulin before and needed instruction. Pt placed on nurse schedule for 3/15 and will bring diabetic supplies for education. Pt confirmed understanding. ?

## 2021-11-07 ENCOUNTER — Ambulatory Visit (INDEPENDENT_AMBULATORY_CARE_PROVIDER_SITE_OTHER): Payer: Medicaid Other | Admitting: *Deleted

## 2021-11-07 ENCOUNTER — Other Ambulatory Visit: Payer: Self-pay

## 2021-11-07 DIAGNOSIS — O0990 Supervision of high risk pregnancy, unspecified, unspecified trimester: Secondary | ICD-10-CM

## 2021-11-07 DIAGNOSIS — O24414 Gestational diabetes mellitus in pregnancy, insulin controlled: Secondary | ICD-10-CM

## 2021-11-07 MED ORDER — PEN NEEDLES 31G X 6 MM MISC: 11 refills | Status: DC

## 2021-11-07 NOTE — Progress Notes (Signed)
? ?  NURSE VISIT- Insulin Administration Education ? ?SUBJECTIVE:  ?Kaitlin Klein is a 26 y.o. G30P1001 female here for insulin administration for newly diagnosed GDM. She is [redacted]w[redacted]d pregnant. Only brought in Humalog Doyline as that was all that was given to her by pharmacy. Also only received Accu-chek lancets and strips.  Currently has Relion Meter with only a few strips and lancets left from previous pregnancy. ? ?OBJECTIVE:  ?LMP 04/13/2021 (Approximate)   ?Appears well, in no apparent distress ?Verified with pharmacy all orders sent in except for kwikpen needles. Needles not initially sent in with original orders-order sent today ?NPH out of stock and should be arriving today. Encouraged to speak with pharmacist about administration instructions if she is given vial and syringes. ?Will need education of how to draw up medication. ?Kwikpen instructions given verbally and written in regards to priming and administration. Advised to discuss with pharmacist if she has further questions.  ? ? ?Meds ordered this encounter  ?Medications  ? Insulin Pen Needle (PEN NEEDLES) 31G X 6 MM MISC  ?  Sig: Please dispense pen needles according to insurance preference  ?  Dispense:  100 each  ?  Refill:  11  ? ? ? ?ASSESSMENT: ?Pregnancy [redacted]w[redacted]d for insulin administration for newly diagnosed GDM ?PLAN: ?Pick up Accu-chek meter, Pen needles and NPH from pharmacy today ? ?Follow-up: as scheduled  ? ?Debbe Odea Katryna Tschirhart  ?11/07/2021 ?2:27 PM ? ? ? ? ?

## 2021-11-15 ENCOUNTER — Ambulatory Visit (INDEPENDENT_AMBULATORY_CARE_PROVIDER_SITE_OTHER): Payer: Medicaid Other | Admitting: Obstetrics & Gynecology

## 2021-11-15 ENCOUNTER — Other Ambulatory Visit: Payer: Self-pay

## 2021-11-15 ENCOUNTER — Encounter: Payer: Self-pay | Admitting: Obstetrics & Gynecology

## 2021-11-15 VITALS — BP 112/72 | HR 118 | Wt 227.2 lb

## 2021-11-15 DIAGNOSIS — O24414 Gestational diabetes mellitus in pregnancy, insulin controlled: Secondary | ICD-10-CM

## 2021-11-15 DIAGNOSIS — O0993 Supervision of high risk pregnancy, unspecified, third trimester: Secondary | ICD-10-CM

## 2021-11-15 DIAGNOSIS — Z3A29 29 weeks gestation of pregnancy: Secondary | ICD-10-CM

## 2021-11-15 LAB — POCT URINALYSIS DIPSTICK OB
Blood, UA: NEGATIVE
Ketones, UA: NEGATIVE
Leukocytes, UA: NEGATIVE
Nitrite, UA: NEGATIVE
POC,PROTEIN,UA: NEGATIVE

## 2021-11-15 NOTE — Progress Notes (Signed)
? ?HIGH-RISK PREGNANCY VISIT ?Patient name: Kaitlin Klein MRN [id number]  Date of birth: December 04, 1995 ?Chief Complaint:   ?Routine Prenatal Visit and High Risk Gestation ? ?History of Present Illness:   ?Kaitlin Klein is a 26 y.o. G82P1001 female at 57w0dwith an Estimated Date of Delivery: 01/31/22 being seen today for ongoing management of a high-risk pregnancy complicated by: ? 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 Did not break sugars with her.  Typically checking about twice per day. ? ?Today she reports  pelvic pressure, which she states has been about the same and normal for her .  ? ?Contractions: Not present. Vag. Bleeding: None.  Movement: Present. denies leaking of fluid.  ? ? ?  09/20/2021  ?  8:43 AM 08/06/2021  ? 10:15 AM 07/07/2020  ?  2:27 PM 06/15/2020  ? 10:00 AM 04/20/2020  ?  9:45 AM  ?Depression screen PHQ 2/9  ?Decreased Interest 2 2 0 3 2  ?Down, Depressed, Hopeless 0 1 0 3 3  ?PHQ - 2 Score 2 3 0 6 5  ?Altered sleeping 0 _0 ?Tired, decreased energy _1 ?Change in appetite 1 2  0 3  ?Feeling bad or failure about yourself  3 0  0 3  ?Trouble concentrating 0 0  0 0  ?Moving slowly or fidgety/restless 0 0  0 0  ?Suicidal thoughts 0 0  0 0  ?PHQ-9 Score _2 ?Difficult doing work/chores    Not difficult at all Very difficult  ? ? ? ?Current Outpatient Medications  ?Medication Instructions  ? Accu-Chek Softclix Lancets lancets Use as instructed to check blood sugar 4 times daily  ? acetaminophen (TYLENOL) 650 mg, Oral, Every 4 hours PRN  ? aspirin 162 mg, Oral, Daily, Swallow whole.  ? Blood Glucose Monitoring Suppl (ACCU-CHEK GUIDE ME) w/Device KIT 1 each, Does not apply, 4 times daily  ? Blood Pressure Monitoring (BLOOD PRESSURE CUFF) MISC 1 kit, Does not apply, Daily  ? glucose blood (ACCU-CHEK GUIDE) test strip Use as instructed to check blood sugar 4 times daily  ? insulin  lispro (HUMALOG KWIKPEN) 10 Units, Subcutaneous, 3 times daily, With meals  ? insulin NPH Human (HUMULIN N) 100 UNIT/ML injection Inject 20 units in the morning and 10 units at bedtime  ? Insulin Pen Needle (PEN NEEDLES) 31G X 6 MM MISC Please dispense pen needles according to insurance preference  ? loratadine (CLARITIN) 10 mg, Oral, Daily  ? Pediatric Multivit-Minerals-C (FLINTSTONES GUMMIES COMPLETE) CHEW 2 tablets, Oral, Daily  ? sertraline (ZOLOFT) 25 mg, Oral, Daily  ?  ? ?Review of Systems:   ?Pertinent items are noted in HPI ?Denies abnormal vaginal discharge w/ itching/odor/irritation, headaches, visual changes, shortness of breath, chest pain, abdominal pain, severe nausea/vomiting, or problems with urination or bowel movements unless otherwise stated above. ?Pertinent History Reviewed:  ?Reviewed past medical,surgical, social, obstetrical and family history.  ?Reviewed problem list, medications and allergies. ?Physical Assessment:  ? ?Vitals:  ? 11/15/21 0957  ?BP: 112/72  ?Pulse: (!) 118  ?Weight: 227 lb 3.2 oz (103.1 kg)  ?Body mass index is 36.67 kg/m?. ?     ?     Physical Examination:  ? General appearance: alert, well appearing, and in no distress ? Mental status: normal mood, behavior, speech, dress,  motor activity, and thought processes ? Skin: warm & dry  ? Extremities: Edema: None  ?  Cardiovascular: normal heart rate noted ? Respiratory: normal respiratory effort, no distress ? Abdomen: gravid, soft, non-tender ? Pelvic: Cervical exam deferred        ? ?Fetal Status: Fetal Heart Rate (bpm): 130 Fundal Height: 28 cm Movement: Present   ? ?Fetal Surveillance Testing today: doppler  ? ?Chaperone: N/A   ? ?Results for orders placed or performed in visit on 11/15/21 (from the past 24 hour(s))  ?POC Urinalysis Dipstick OB  ? Collection Time: 11/15/21 10:00 AM  ?Result Value Ref Range  ? Color, UA    ? Clarity, UA    ? Glucose, UA 4+ (A) Negative  ? Bilirubin, UA    ? Ketones, UA neg   ? Spec Grav, UA     ? Blood, UA neg   ? pH, UA    ? POC,PROTEIN,UA Negative Negative, Trace, Small (1+), Moderate (2+), Large (3+), 4+  ? Urobilinogen, UA    ? Nitrite, UA neg   ? Leukocytes, UA Negative Negative  ? Appearance    ? Odor    ?  ? ?Assessment & Plan:  ?High-risk pregnancy: G2P1001 at 54w0dwith an Estimated Date of Delivery: 01/31/22  ? ?1) GDMA2 ?-though pt reports 110s sugars, she did not have log and suspect she is not checking ?-UA with 4+ glucose ?-follow up in one week with sugar log, discussed importance of testing ?-for now continue Novolog 10u with meals ?-continue growth q 4wks- scheduled 4/4 ? 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 ?Meds: No orders of the defined types were placed in this encounter. ? ? ?Labs/procedures today: none ? ?Treatment Plan:  as outlined above ? ?Reviewed: Preterm labor symptoms and general obstetric precautions including but not limited to vaginal bleeding, contractions, leaking of fluid and fetal movement were reviewed in detail with the patient.  All questions were answered. PT has home bp cuff. Check bp weekly, let [place]now if >140/90.  ? ?Follow-up: Return in about 1 week (around 11/22/2021) [place]. ? ? ?Future Appointments  ?Date Time Provider [place] ?11/20/2021  3:15 PM [facility] [place] ?11/27/2021 10:45 AM [facility] [place] None  ?11/27/2021  1:30 PM Eure, LMertie Clause MD [facility]  ? ? ?Orders Placed This Encounter  ?Procedures  ? POC Urinalysis Dipstick OB  ? ? ?JJanyth Pupa DO ?Attending [facility] ?[facility] [place]? ? ? ?

## 2021-11-20 ENCOUNTER — Other Ambulatory Visit: Payer: Self-pay

## 2021-11-20 ENCOUNTER — Ambulatory Visit (INDEPENDENT_AMBULATORY_CARE_PROVIDER_SITE_OTHER): Payer: Medicaid Other | Admitting: Registered"

## 2021-11-20 ENCOUNTER — Encounter: Payer: Medicaid Other | Attending: Women's Health | Admitting: Registered"

## 2021-11-20 DIAGNOSIS — O24414 Gestational diabetes mellitus in pregnancy, insulin controlled: Secondary | ICD-10-CM

## 2021-11-20 DIAGNOSIS — Z713 Dietary counseling and surveillance: Secondary | ICD-10-CM | POA: Insufficient documentation

## 2021-11-20 DIAGNOSIS — Z3A00 Weeks of gestation of pregnancy not specified: Secondary | ICD-10-CM | POA: Insufficient documentation

## 2021-11-20 NOTE — Progress Notes (Signed)
Patient was seen for Gestational Diabetes self-management on 11/20/21  ?Start time 1517 and End time 1620  ? ?Estimated due date: 01/31/22; [redacted]w[redacted]d ? ?Clinical: ?Medications: Humalog 10 tid; has not started NPH yet because did not get Rx for syringes to draw up from vials.  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 ?Dietary and Lifestyle History: ?Pt craving spicy foods, has been really thirsty this pregnancy. Pt reports drinking slushies and sodas daily. ? 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 Lunchtime 30 min break doesn't have much time to fix something to eat and take a break. ? ?Gets up 9-10 am fixes breakfast for daughter: bacon, eggs, fruit, but doesn't sit down with her to eat, needs to get computer set up for work. ? ?Physical Activity: ADL ?Stress: not assessed ?Sleep: 2-3 am - 10 am ? ?24 hr Recall:  ?First Meal:  ?Snack: dr pepper and chips ?Second meal:chicken fingers ?Snack: hot fries ?Third meal: chicken and dumplings, 2 c regular ginger ale ?Snack: none ?Beverages: "not enough" water, no tea recently, coffee not much ? ?NUTRITION INTERVENTION  ?Nutrition education (E-1) on the following topics:  ? ?Initial Follow-up ? ?[x]  []  Definition of Gestational Diabetes ?[x]  []  Why dietary management is important in controlling blood glucose ?[x]  []  Effects each nutrient has on blood glucose levels ?[x]  []  Simple carbohydrates vs complex carbohydrates ?[x]  []  Fluid intake ?[x]  []  Creating a balanced meal plan ?[x]  []  Carbohydrate counting  ?[x]  []  When to check blood glucose levels ?[x]  []  Proper blood glucose monitoring techniques ?[x]  []  Effect of stress and stress reduction techniques  ?[x]  []  Exercise effect on blood glucose levels, appropriate exercise during pregnancy ?[x]  []  Importance of limiting caffeine and abstaining from alcohol and smoking ?[x]  []  Medications used for blood sugar control during  pregnancy ?[x]  []  Hypoglycemia and rule of 15 ?[x]  []  Postpartum self care ? ?Patient already has a meter, just started testing pre breakfast and 2 hours after each meal. ? ?Patient instructed to monitor glucose levels: ?FBS: 60 - ? 95 mg/dL (some clinics use 90 for cutoff) ?1 hour: ? 140 mg/dL ?2 hour: ? mg/dL ? ?Patient received handouts: ?Nutrition Diabetes and Pregnancy ?Carbohydrate Counting List ? ?Patient will be seen for follow-up as needed.  ?

## 2021-11-22 ENCOUNTER — Ambulatory Visit (INDEPENDENT_AMBULATORY_CARE_PROVIDER_SITE_OTHER): Payer: Medicaid Other | Admitting: Obstetrics & Gynecology

## 2021-11-22 ENCOUNTER — Encounter: Payer: Self-pay | Admitting: Women's Health

## 2021-11-22 VITALS — BP 117/78 | HR 105 | Wt 226.0 lb

## 2021-11-22 DIAGNOSIS — O24414 Gestational diabetes mellitus in pregnancy, insulin controlled: Secondary | ICD-10-CM

## 2021-11-22 DIAGNOSIS — O0993 Supervision of high risk pregnancy, unspecified, third trimester: Secondary | ICD-10-CM

## 2021-11-22 DIAGNOSIS — Z3A30 30 weeks gestation of pregnancy: Secondary | ICD-10-CM

## 2021-11-22 LAB — POCT URINALYSIS DIPSTICK OB
Blood, UA: NEGATIVE
Leukocytes, UA: NEGATIVE
Nitrite, UA: NEGATIVE
POC,PROTEIN,UA: NEGATIVE

## 2021-11-22 NOTE — Progress Notes (Signed)
? ? ?HIGH-RISK PREGNANCY VISIT ?Patient name: Kaitlin Klein MRN [id number]  Date of birth: [birth year] ?Chief Complaint:   ?Routine Prenatal Visit ? ?History of Present Illness:   ?Kaitlin Klein is a 26 y.o. G64P1001 female at [redacted]w[redacted]d with an Estimated Date of Delivery: 01/31/22 being seen today for ongoing management of a high-risk pregnancy complicated by [id number] on novolog 10 with each meal.   ? ?Today she reports no complaints. Contractions: Not present.  .  Movement: Present. denies leaking of fluid.  ? ? ?  09/20/2021  ?  8:43 AM 08/06/2021  ? 10:15 AM 07/07/2020  ?  2:27 PM 06/15/2020  ? 10:00 AM 04/20/2020  ?  9:45 AM  ?Depression screen PHQ 2/9  ?Decreased Interest 2 2 0 3 2  ?Down, Depressed, Hopeless 0 1 0 3 3  ?PHQ - 2 Score 2 3 0 6 5  ?Altered sleeping 0 1  2 2   ?Tired, decreased energy 2 3  3 3   ?Change in appetite 1 2  0 3  ?Feeling bad or failure about yourself  3 0  0 3  ?Trouble concentrating 0 0  0 0  ?Moving slowly or fidgety/restless 0 0  0 0  ?Suicidal thoughts 0 0  0 0  ?PHQ-9 Score 8 9  11 16   ?Difficult doing work/chores    Not difficult at all Very difficult  ? ?  ? ?  09/20/2021  ?  8:48 AM 08/06/2021  ? 10:15 AM 06/15/2020  ? 10:02 AM 04/20/2020  ?  9:46 AM  ?GAD 7 : Generalized Anxiety Score  ?Nervous, Anxious, on Edge 1 1 3 2   ?Control/stop worrying 0 0 0 2  ?Worry too much - different things 1 0 0 3  ?Trouble relaxing 0 2 3 2   ?Restless 0 0 0 0  ?Easily annoyed or irritable 1 3 3 3   ?Afraid - awful might happen 0 0 0 0  ?Total GAD 7 Score 3 6 9 12   ?Anxiety Difficulty   Not difficult at all Very difficult  ? ? ? ?Review of Systems:   ?Pertinent items are noted in HPI ?Denies abnormal vaginal discharge w/ itching/odor/irritation, headaches, visual changes, shortness of breath, chest pain, abdominal pain, severe nausea/vomiting, or problems with urination or bowel movements unless otherwise stated above. ?Pertinent History Reviewed:  ?Reviewed past medical,surgical, social, obstetrical and  family history.  ?Reviewed problem list, medications and allergies. ?Physical Assessment:  ? ?Vitals:  ? 11/22/21 1041  ?BP: 117/78  ?Pulse: (!) 105  ?Weight: 226 lb (102.5 kg)  ?Body mass index is 36.48 kg/m?. ?     ?     Physical Examination:  ? General appearance: alert, well appearing, and in no distress ? Mental status: alert, oriented to person, place, and time ? Skin: warm & dry  ? Extremities: Edema: None  ?  Cardiovascular: normal heart rate noted ? Respiratory: normal respiratory effort, no distress ? Abdomen: gravid, soft, non-tender ? Pelvic: Cervical exam deferred        ? ?Fetal Status: Fetal Heart Rate (bpm): 145 Fundal Height: 32 cm Movement: Present   ? ?Fetal Surveillance Testing today: FHR 145  ? ?Chaperone: N/A   ? ?Results for orders placed or performed in visit on 11/22/21 (from the past 24 hour(s))  ?POC Urinalysis Dipstick OB  ? Collection Time: 11/22/21 10:47 AM  ?Result Value Ref Range  ? Color, UA    ? Clarity, UA    ? Glucose, UA  Large (3+) (A) Negative  ? Bilirubin, UA    ? Ketones, UA small   ? Spec Grav, UA    ? Blood, UA neg   ? pH, UA    ? POC,PROTEIN,UA Negative Negative, Trace, Small (1+), Moderate (2+), Large (3+), 4+  ? Urobilinogen, UA    ? Nitrite, UA neg   ? Leukocytes, UA Negative Negative  ? Appearance    ? Odor    ?  ?Assessment & Plan:  ?High-risk pregnancy: G2P1001 at [redacted]w[redacted]d with an Estimated Date of Delivery: 01/31/22  ? ? ?  ICD-10-CM   ?1. Supervision of high-risk pregnancy, third trimester  O09.93 POC Urinalysis Dipstick OB  ?  ?2. Insulin controlled gestational diabetes mellitus (GDM) in third trimester, suboptimal  O24.414   ? Just started checking CBG, Fasting this am was 96, only 1 I have, 2 hours a little high, increase to 12 units with meals  ?  ?3. [redacted] weeks gestation of pregnancy  Z3A.30 POC Urinalysis Dipstick OB  ?  ?  ? ? ? ?Meds: No orders of the defined types were placed in this encounter. ? ? ?Orders:  ?Orders Placed This Encounter  ?Procedures  ? POC  Urinalysis Dipstick OB  ?  ? ?Labs/procedures today: none ? ?Treatment Plan:  close supervision, follow up 1 week ? ?Reviewed: Preterm labor symptoms and general obstetric precautions including but not limited to vaginal bleeding, contractions, leaking of fluid and fetal movement were reviewed in detail with the patient.  All questions were answered. Does have home bp cuff. Office bp cuff given: not applicable. Check bp weekly, let us know if consistently >140 and/or >90. ? ?Follow-up: Return in about 1 week (around 11/29/2021) for [place]. ? ? ?Future Appointments  ?Date Time Provider [place]  ?11/27/2021 10:45 AM [facility] [place] [facility] None  ?11/27/2021  1:30 PM Alishah Schulte, Mertie Clause, MD [facility]  ? ? ?Orders Placed This Encounter  ?Procedures  ? POC Urinalysis Dipstick OB  ? ?[place]  ?Attending Physician for the [facility] for [place] ?[facility] ?11/22/2021 ?11:26 AM ? ?

## 2021-11-26 ENCOUNTER — Other Ambulatory Visit: Payer: Self-pay | Admitting: Women's Health

## 2021-11-26 DIAGNOSIS — O24112 Pre-existing diabetes mellitus, type 2, in pregnancy, second trimester: Secondary | ICD-10-CM

## 2021-11-26 DIAGNOSIS — Z3482 Encounter for supervision of other normal pregnancy, second trimester: Secondary | ICD-10-CM

## 2021-11-26 DIAGNOSIS — O418X20 Other specified disorders of amniotic fluid and membranes, second trimester, not applicable or unspecified: Secondary | ICD-10-CM

## 2021-11-27 ENCOUNTER — Ambulatory Visit (INDEPENDENT_AMBULATORY_CARE_PROVIDER_SITE_OTHER): Payer: Medicaid Other | Admitting: Obstetrics & Gynecology

## 2021-11-27 ENCOUNTER — Ambulatory Visit (INDEPENDENT_AMBULATORY_CARE_PROVIDER_SITE_OTHER): Payer: Medicaid Other

## 2021-11-27 ENCOUNTER — Encounter: Payer: Medicaid Other | Admitting: Women's Health

## 2021-11-27 VITALS — BP 120/77 | HR 102 | Wt 226.0 lb

## 2021-11-27 DIAGNOSIS — O418X20 Other specified disorders of amniotic fluid and membranes, second trimester, not applicable or unspecified: Secondary | ICD-10-CM

## 2021-11-27 DIAGNOSIS — O0990 Supervision of high risk pregnancy, unspecified, unspecified trimester: Secondary | ICD-10-CM

## 2021-11-27 DIAGNOSIS — O24112 Pre-existing diabetes mellitus, type 2, in pregnancy, second trimester: Secondary | ICD-10-CM

## 2021-11-27 DIAGNOSIS — O0993 Supervision of high risk pregnancy, unspecified, third trimester: Secondary | ICD-10-CM

## 2021-11-27 DIAGNOSIS — Z3482 Encounter for supervision of other normal pregnancy, second trimester: Secondary | ICD-10-CM

## 2021-11-27 DIAGNOSIS — O24419 Gestational diabetes mellitus in pregnancy, unspecified control: Secondary | ICD-10-CM

## 2021-11-27 DIAGNOSIS — O468X2 Other antepartum hemorrhage, second trimester: Secondary | ICD-10-CM

## 2021-11-27 NOTE — Progress Notes (Signed)
[place] 30+5 wks,cephalic,anterior placenta gr 0,resolved subchorionic hemorrhage,AFI 17 cm,FHR 132 bpm,EFW 1906 g 82% ?

## 2021-11-27 NOTE — Progress Notes (Signed)
HIGH-RISK PREGNANCY VISIT Patient name: Kaitlin Klein MRN [id number]  Date of birth: 02-Jun-1996 Chief Complaint:   Routine Prenatal Visit  History of Present Illness:   Kaitlin Klein is a 25 y.o. G37P1001 female at [redacted]w[redacted]d with an Estimated Date of Delivery: 01/31/22 being seen today for ongoing management of a high-risk pregnancy complicated by [id number].    Today she reports no complaints. Contractions: Not present. Vag. Bleeding: None.  Movement: Present. denies leaking of fluid.      09/20/2021    8:43 AM 08/06/2021   10:15 AM 07/07/2020    2:27 PM 06/15/2020   10:00 AM 04/20/2020    9:45 AM  Depression screen PHQ 2/9  Decreased Interest 2 2 0 3 2  Down, Depressed, Hopeless 0 1 0 3 3  PHQ - 2 Score 2 3 0 6 5  Altered sleeping 0 1  2 2   Tired, decreased energy 2 3  3 3   Change in appetite 1 2  0 3  Feeling bad or failure about yourself  3 0  0 3  Trouble concentrating 0 0  0 0  Moving slowly or fidgety/restless 0 0  0 0  Suicidal thoughts 0 0  0 0  PHQ-9 Score 8 9  11 16   Difficult doing work/chores    Not difficult at all Very difficult        09/20/2021    8:48 AM 08/06/2021   10:15 AM 06/15/2020   10:02 AM 04/20/2020    9:46 AM  GAD 7 : Generalized Anxiety Score  Nervous, Anxious, on Edge 1 1 3 2   Control/stop worrying 0 0 0 2  Worry too much - different things 1 0 0 3  Trouble relaxing 0 2 3 2   Restless 0 0 0 0  Easily annoyed or irritable 1 3 3 3   Afraid - awful might happen 0 0 0 0  Total GAD 7 Score 3 6 9 12   Anxiety Difficulty   Not difficult at all Very difficult     Review of Systems:   Pertinent items are noted in HPI Denies abnormal vaginal discharge w/ itching/odor/irritation, headaches, visual changes, shortness of breath, chest pain, abdominal pain, severe nausea/vomiting, or problems with urination or bowel movements unless otherwise stated above. Pertinent History Reviewed:  Reviewed past medical,surgical, social, obstetrical and family history.   Reviewed problem list, medications and allergies. Physical Assessment:   Vitals:   11/27/21 1132  BP: 120/77  Pulse: (!) 102  Weight: 226 lb (102.5 kg)  Body mass index is 36.48 kg/m.           Physical Examination:   General appearance: alert, well appearing, and in no distress  Mental status: alert, oriented to person, place, and time  Skin: warm & dry   Extremities: Edema: None    Cardiovascular: normal heart rate noted  Respiratory: normal respiratory effort, no distress  Abdomen: gravid, soft, non-tender  Pelvic: Cervical exam deferred         Fetal Status:     Movement: Present    Fetal Surveillance Testing today:    Chaperone: N/A    No results found for this or any previous visit (from the past 24 hour(s)).  Assessment & Plan:  High-risk pregnancy: G2P1001 at [redacted]w[redacted]d with an Estimated Date of Delivery: 01/31/22     ICD-10-CM   1. Supervision of high-risk pregnancy, third trimester  O09.93     2. Gestational diabetes mellitus, class A2  O24.419  Meds: No orders of the defined types were placed in this encounter.   Orders: No orders of the defined types were placed in this encounter.    Labs/procedures today: none  Treatment Plan:  begin twice weekly testing    Follow-up: Return in about 1 week (around 12/04/2021) for NST(needs twice weekly NST w/1 provider visit weekly).   Future Appointments  Date Time Provider [place]  11/27/2021  1:30 PM Florian Buff, MD [facility]    No orders of the defined types were placed in this encounter.  Florian Buff  Attending Physician for the [facility] 11/27/2021 1:00 PM

## 2021-12-04 ENCOUNTER — Ambulatory Visit (INDEPENDENT_AMBULATORY_CARE_PROVIDER_SITE_OTHER): Payer: Medicaid Other | Admitting: Obstetrics & Gynecology

## 2021-12-04 ENCOUNTER — Encounter: Payer: Self-pay | Admitting: Obstetrics & Gynecology

## 2021-12-04 ENCOUNTER — Other Ambulatory Visit: Payer: Medicaid Other | Admitting: Obstetrics & Gynecology

## 2021-12-04 ENCOUNTER — Ambulatory Visit: Payer: Medicaid Other

## 2021-12-04 VITALS — BP 120/78 | HR 109 | Wt 221.8 lb

## 2021-12-04 DIAGNOSIS — Z331 Pregnant state, incidental: Secondary | ICD-10-CM

## 2021-12-04 DIAGNOSIS — Z1389 Encounter for screening for other disorder: Secondary | ICD-10-CM

## 2021-12-04 DIAGNOSIS — O0990 Supervision of high risk pregnancy, unspecified, unspecified trimester: Secondary | ICD-10-CM

## 2021-12-04 DIAGNOSIS — O24319 Unspecified pre-existing diabetes mellitus in pregnancy, unspecified trimester: Secondary | ICD-10-CM

## 2021-12-04 LAB — POCT URINALYSIS DIPSTICK OB
Blood, UA: NEGATIVE
Leukocytes, UA: NEGATIVE
Nitrite, UA: NEGATIVE

## 2021-12-04 MED ORDER — INSULIN GLARGINE 100 UNITS/ML SOLOSTAR PEN
20.0000 [IU] | PEN_INJECTOR | Freq: Every day | SUBCUTANEOUS | 11 refills | Status: DC
Start: 2021-12-04 — End: 2022-01-20

## 2021-12-04 MED ORDER — INSULIN LISPRO (1 UNIT DIAL) 100 UNIT/ML (KWIKPEN): 12.0000 [IU] | PEN_INJECTOR | Freq: Three times a day (TID) | SUBCUTANEOUS | 11 refills | Status: DC

## 2021-12-04 NOTE — Progress Notes (Signed)
HIGH-RISK PREGNANCY VISIT Patient name: Kaitlin Klein MRN [id number]  Date of birth: Oct 01, 1995 Chief Complaint:   High Risk Gestation and Routine Prenatal Visit (NST)  History of Present Illness:   Kaitlin Klein is a 26 y.o. G95P1001 female at [redacted]w[redacted]d with an Estimated Date of Delivery: 01/31/22 being seen today for ongoing management of a high-risk pregnancy complicated by Class B DM.    Today she reports no complaints. Contractions: Not present.  .  Movement: Present. denies leaking of fluid.      09/20/2021    8:43 AM 08/06/2021   10:15 AM 07/07/2020    2:27 PM 06/15/2020   10:00 AM 04/20/2020    9:45 AM  Depression screen PHQ 2/9  Decreased Interest 2 2 0 3 2  Down, Depressed, Hopeless 0 1 0 3 3  PHQ - 2 Score 2 3 0 6 5  Altered sleeping 0 1  2 2   Tired, decreased energy 2 3  3 3   Change in appetite 1 2  0 3  Feeling bad or failure about yourself  3 0  0 3  Trouble concentrating 0 0  0 0  Moving slowly or fidgety/restless 0 0  0 0  Suicidal thoughts 0 0  0 0  PHQ-9 Score 8 9  11 16   Difficult doing work/chores    Not difficult at all Very difficult        09/20/2021    8:48 AM 08/06/2021   10:15 AM 06/15/2020   10:02 AM 04/20/2020    9:46 AM  GAD 7 : Generalized Anxiety Score  Nervous, Anxious, on Edge 1 1 3 2   Control/stop worrying 0 0 0 2  Worry too much - different things 1 0 0 3  Trouble relaxing 0 2 3 2   Restless 0 0 0 0  Easily annoyed or irritable 1 3 3 3   Afraid - awful might happen 0 0 0 0  Total GAD 7 Score 3 6 9 12   Anxiety Difficulty   Not difficult at all Very difficult     Review of Systems:   Pertinent items are noted in HPI Denies abnormal vaginal discharge w/ itching/odor/irritation, headaches, visual changes, shortness of breath, chest pain, abdominal pain, severe nausea/vomiting, or problems with urination or bowel movements unless otherwise stated above. Pertinent History Reviewed:  Reviewed past medical,surgical, social, obstetrical  and family history.  Reviewed problem list, medications and allergies. Physical Assessment:   Vitals:   12/04/21 1059  BP: 120/78  Pulse: (!) 109  Weight: 221 lb 12.8 oz (100.6 kg)  Body mass index is 35.8 kg/m.           Physical Examination:   General appearance: alert, well appearing, and in no distress  Mental status: alert, oriented to person, place, and time  Skin: warm & dry   Extremities: Edema: None    Cardiovascular: normal heart rate noted  Respiratory: normal respiratory effort, no distress  Abdomen: gravid, soft, non-tender  Pelvic: Cervical exam deferred         Fetal Status:     Movement: Present    Fetal Surveillance Testing today: reactive NST   Chaperone: N/A    Results for orders placed or performed in visit on 12/04/21 (from the past 24 hour(s))  POC Urinalysis Dipstick OB   Collection Time: 12/04/21 11:07 AM  Result Value Ref Range   Color, UA     Clarity, UA     Glucose, UA Large (3+) (  A) Negative   Bilirubin, UA     Ketones, UA large    Spec Grav, UA     Blood, UA neg    pH, UA     POC,PROTEIN,UA Trace Negative, Trace, Small (1+), Moderate (2+), Large (3+), 4+   Urobilinogen, UA     Nitrite, UA neg    Leukocytes, UA Negative Negative   Appearance     Odor      Assessment & Plan:  High-risk pregnancy: G2P1001 at [redacted]w[redacted]d with an Estimated Date of Delivery: 01/31/22      ICD-10-CM   1. Supervision of high risk pregnancy, antepartum  O09.90     2. 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 Pregnant state, incidental  Z33.1 POC Urinalysis Dipstick OB    4. Screening for genitourinary condition  Z13.89 POC Urinalysis Dipstick OB        Meds:  Meds ordered this encounter  Medications   insulin lispro (HUMALOG KWIKPEN) 100 UNIT/ML KwikPen    Sig: Inject 12 Units into the  skin 3 (three) times daily. With meals    Dispense:  15 mL    Refill:  11   insulin glargine (LANTUS) 100 unit/mL SOPN    Sig: Inject 20 Units into the skin at bedtime.    Dispense:  15 mL    Refill:  11    Orders:  Orders Placed This Encounter  Procedures   POC Urinalysis Dipstick OB     Labs/procedures today: NST  Treatment Plan:  twice weekly surveillance    Follow-up: No follow-ups on file.   Future Appointments  Date Time Provider [place]  12/07/2021 10:10 AM [facility] NURSE [facility]  12/11/2021 10:50 AM [facility] NURSE [facility]  12/11/2021 11:10 AM Roma Schanz, CNM [facility]  12/14/2021 11:10 AM [facility] NURSE [facility]  12/18/2021 10:50 AM [facility] NURSE [facility]  12/18/2021 11:10 AM Florian Buff, MD [facility]  12/21/2021 11:50 AM [facility] NURSE [facility]  12/25/2021 10:50 AM [facility] NURSE [facility]  12/25/2021 11:10 AM Florian Buff, MD [facility]  12/28/2021 10:50 AM [facility] NURSE [facility]  01/01/2022 10:50 AM [facility] NURSE [facility]  01/01/2022 11:10 AM Roma Schanz, CNM [facility]  01/04/2022 11:30 AM [facility] NURSE [facility]  01/08/2022 10:50 AM [facility] NURSE [facility]  01/08/2022 11:10 AM Janyth Pupa, DO [facility]  01/11/2022 11:30 AM [facility] NURSE [facility]  01/15/2022 10:50 AM [facility] NURSE [facility]  01/15/2022 11:10 AM Janyth Pupa, DO [facility]  01/18/2022 11:10 AM [facility] NURSE [facility]  01/22/2022 10:50 AM [facility] NURSE [facility]  01/22/2022 11:10 AM Florian Buff, MD [facility]  01/25/2022 11:30 AM [facility] NURSE [facility]  01/29/2022 10:50 AM [facility] NURSE [facility]  01/29/2022 11:10 AM Janyth Pupa, DO [facility]  02/01/2022 11:10 AM [facility] NURSE [facility]    Orders Placed This Encounter  Procedures   POC Urinalysis Dipstick OB   Florian Buff  Attending  Physician for the [facility] [facility] 12/04/2021 11:59 AM

## 2021-12-04 NOTE — Patient Instructions (Signed)
Keep on the Humalog 12 units three times daily with meals for now.  ?Once you get the Lantus needles, STOP three times daily meal insulin and START bedtime insulin. Give Lantus 20 units at bedtime.  ?

## 2021-12-07 ENCOUNTER — Ambulatory Visit (INDEPENDENT_AMBULATORY_CARE_PROVIDER_SITE_OTHER): Payer: Medicaid Other | Admitting: *Deleted

## 2021-12-07 VITALS — BP 127/80 | HR 114 | Wt 222.4 lb

## 2021-12-07 DIAGNOSIS — Z3A32 32 weeks gestation of pregnancy: Secondary | ICD-10-CM

## 2021-12-07 DIAGNOSIS — Z1389 Encounter for screening for other disorder: Secondary | ICD-10-CM | POA: Diagnosis not present

## 2021-12-07 DIAGNOSIS — O288 Other abnormal findings on antenatal screening of mother: Secondary | ICD-10-CM

## 2021-12-07 DIAGNOSIS — Z331 Pregnant state, incidental: Secondary | ICD-10-CM

## 2021-12-07 DIAGNOSIS — O0990 Supervision of high risk pregnancy, unspecified, unspecified trimester: Secondary | ICD-10-CM

## 2021-12-07 DIAGNOSIS — O24414 Gestational diabetes mellitus in pregnancy, insulin controlled: Secondary | ICD-10-CM

## 2021-12-07 LAB — POCT URINALYSIS DIPSTICK OB
Blood, UA: NEGATIVE
Leukocytes, UA: NEGATIVE
Nitrite, UA: NEGATIVE

## 2021-12-07 NOTE — Progress Notes (Signed)
? ?  NURSE VISIT- NST ? ?SUBJECTIVE:  ?Kaitlin Klein is a 26 y.o. G88P1001 female at [redacted]w[redacted]d, here for a NST for pregnancy complicated by A2DM currently on insulin .  She reports active fetal movement, contractions: none, vaginal bleeding: none, membranes: intact.  ? ?OBJECTIVE:  ?BP 127/80   Pulse (!) 114   Wt 222 lb 6.4 oz (100.9 kg)   LMP 04/13/2021 (Approximate)   BMI 35.90 kg/m?   ?Appears well, no apparent distress ? ?Results for orders placed or performed in visit on 12/07/21 (from the past 24 hour(s))  ?POC Urinalysis Dipstick OB  ? Collection Time: 12/07/21 10:19 AM  ?Result Value Ref Range  ? Color, UA    ? Clarity, UA    ? Glucose, UA Large (3+) (A) Negative  ? Bilirubin, UA    ? Ketones, UA moderate   ? Spec Grav, UA    ? Blood, UA neg   ? pH, UA    ? POC,PROTEIN,UA Trace Negative, Trace, Small (1+), Moderate (2+), Large (3+), 4+  ? Urobilinogen, UA    ? Nitrite, UA neg   ? Leukocytes, UA Negative Negative  ? Appearance    ? Odor    ? ? ?NST: FHR baseline 135 bpm, Variability: moderate, Accelerations:present, Decelerations:  Absent= Cat 1/reactive ?Toco: none  ? ?ASSESSMENT: ?G2P1001 at [redacted]w[redacted]d with A2DM currently on insulin ?NST reactive ? ?PLAN: ?EFM strip reviewed by Dr. Despina Hidden   ?Recommendations: keep next appointment as scheduled   ? ?Debbe Odea Adyn Hoes  ?12/07/2021 ?11:21 AM ? ?

## 2021-12-10 ENCOUNTER — Inpatient Hospital Stay (HOSPITAL_COMMUNITY)
Admission: AD | Admit: 2021-12-10 | Discharge: 2021-12-10 | Disposition: A | Discharge status: Home / Self Care | Payer: Medicaid Other | Attending: Family Medicine | Admitting: Family Medicine

## 2021-12-10 ENCOUNTER — Encounter (HOSPITAL_COMMUNITY): Payer: Self-pay | Admitting: Family Medicine

## 2021-12-10 ENCOUNTER — Encounter: Payer: Self-pay | Admitting: Women's Health

## 2021-12-10 DIAGNOSIS — R519 Headache, unspecified: Secondary | ICD-10-CM | POA: Diagnosis present

## 2021-12-10 DIAGNOSIS — O99810 Abnormal glucose complicating pregnancy: Secondary | ICD-10-CM

## 2021-12-10 DIAGNOSIS — O26893 Other specified pregnancy related conditions, third trimester: Secondary | ICD-10-CM | POA: Diagnosis not present

## 2021-12-10 DIAGNOSIS — O24414 Gestational diabetes mellitus in pregnancy, insulin controlled: Secondary | ICD-10-CM | POA: Diagnosis not present

## 2021-12-10 DIAGNOSIS — Z3A32 32 weeks gestation of pregnancy: Secondary | ICD-10-CM

## 2021-12-10 DIAGNOSIS — Z79899 Other long term (current) drug therapy: Secondary | ICD-10-CM | POA: Insufficient documentation

## 2021-12-10 HISTORY — DX: Essential (primary) hypertension: I10

## 2021-12-10 HISTORY — DX: Depression, unspecified: F32.A

## 2021-12-10 LAB — CBC
HCT: 32.3 % — ABNORMAL LOW (ref 36.0–46.0)
Hemoglobin: 10.9 g/dL — ABNORMAL LOW (ref 12.0–15.0)
MCH: 28.2 pg (ref 26.0–34.0)
MCHC: 33.7 g/dL (ref 30.0–36.0)
MCV: 83.5 fL (ref 80.0–100.0)
Platelets: 243 10*3/uL (ref 150–400)
RBC: 3.87 MIL/uL (ref 3.87–5.11)
RDW: 15.9 % — ABNORMAL HIGH (ref 11.5–15.5)
WBC: 11.7 10*3/uL — ABNORMAL HIGH (ref 4.0–10.5)
nRBC: 0 % (ref 0.0–0.2)

## 2021-12-10 LAB — GLUCOSE, CAPILLARY
Glucose-Capillary: 290 mg/dL — ABNORMAL HIGH (ref 70–99)
Glucose-Capillary: 215 mg/dL — ABNORMAL HIGH (ref 70–99)

## 2021-12-10 LAB — COMPREHENSIVE METABOLIC PANEL
ALT: 10 U/L (ref 0–44)
AST: 10 U/L — ABNORMAL LOW (ref 15–41)
Albumin: 2.8 g/dL — ABNORMAL LOW (ref 3.5–5.0)
Alkaline Phosphatase: 82 U/L (ref 38–126)
Anion gap: 8 (ref 5–15)
BUN: <5 mg/dL — ABNORMAL LOW (ref 6–20)
CO2: 20 mmol/L — ABNORMAL LOW (ref 22–32)
Calcium: 8.7 mg/dL — ABNORMAL LOW (ref 8.9–10.3)
Chloride: 107 mmol/L (ref 98–111)
Creatinine, Ser: 0.58 mg/dL (ref 0.44–1.00)
GFR, Estimated: >60 mL/min (ref >60)
Glucose, Bld: 282 mg/dL — ABNORMAL HIGH (ref 70–99)
Potassium: 3.4 mmol/L — ABNORMAL LOW (ref 3.5–5.1)
Sodium: 135 mmol/L (ref 135–145)
Total Bilirubin: 0.9 mg/dL (ref 0.3–1.2)
Total Protein: 6.1 g/dL — ABNORMAL LOW (ref 6.5–8.1)

## 2021-12-10 LAB — URINALYSIS, ROUTINE W REFLEX MICROSCOPIC
Bilirubin Urine: NEGATIVE
Glucose, UA: >=500 mg/dL — AB
Hgb urine dipstick: NEGATIVE
Ketones, ur: 80 mg/dL — AB
Nitrite: NEGATIVE
Protein, ur: NEGATIVE mg/dL
Specific Gravity, Urine: 1.033 — ABNORMAL HIGH (ref 1.005–1.030)
pH: 6 (ref 5.0–8.0)

## 2021-12-10 LAB — PROTEIN / CREATININE RATIO, URINE
Creatinine, Urine: 39.38 mg/dL
Protein Creatinine Ratio: 0.33 mg/mg{Cre} — ABNORMAL HIGH (ref 0.00–0.15)
Total Protein, Urine: 13 mg/dL

## 2021-12-10 MED ORDER — LACTATED RINGERS IV BOLUS
1000.0000 mL | Freq: Once | INTRAVENOUS | Status: AC
  Administered 2021-12-10: 1000 mL via INTRAVENOUS

## 2021-12-10 MED ORDER — DIPHENHYDRAMINE HCL 50 MG/ML IJ SOLN
25.0000 mg | Freq: Once | INTRAMUSCULAR | Status: AC
  Administered 2021-12-10: 25 mg via INTRAVENOUS
  Filled 2021-12-10: qty 1

## 2021-12-10 MED ORDER — METOCLOPRAMIDE HCL 10 MG PO TABS: 10.0000 mg | ORAL_TABLET | Freq: Three times a day (TID) | ORAL | 0 refills | Status: DC | PRN

## 2021-12-10 MED ORDER — CYCLOBENZAPRINE HCL 10 MG PO TABS: 10.0000 mg | ORAL_TABLET | Freq: Three times a day (TID) | ORAL | 0 refills | Status: DC | PRN

## 2021-12-10 MED ORDER — INSULIN ASPART 100 UNIT/ML IJ SOLN
10.0000 [IU] | Freq: Once | INTRAMUSCULAR | Status: AC
  Administered 2021-12-10: 10 [IU] via SUBCUTANEOUS

## 2021-12-10 MED ORDER — METOCLOPRAMIDE HCL 5 MG/ML IJ SOLN
10.0000 mg | Freq: Once | INTRAMUSCULAR | Status: AC
  Administered 2021-12-10: 10 mg via INTRAVENOUS
  Filled 2021-12-10: qty 2

## 2021-12-10 MED ORDER — ACETAMINOPHEN-CAFFEINE 500-65 MG PO TABS
2.0000 | ORAL_TABLET | Freq: Once | ORAL | Status: AC
  Administered 2021-12-10: 2 via ORAL
  Filled 2021-12-10: qty 2

## 2021-12-10 NOTE — MAU Note (Signed)
Kaitlin Klein is a 26 y.o. at [redacted]w[redacted]d here in MAU reporting: HA started yesterday during baby shower,  took Tylenol and everything - HA still there.  First she has had with preg.  ? ?Onset of complaint: yesterday ?Pain score: 8 or 9 ?Vitals:  ? 12/10/21 1039  ?BP: 132/70  ?Pulse: (!) 130  ?Resp: 18  ?Temp: 97.6 ?F (36.4 ?C)  ?SpO2: 99%  ?   ?FHT:142 ?Lab orders placed from triage:  urine ?

## 2021-12-10 NOTE — MAU Note (Signed)
Checked BP at home 133/95, 133/81, 147/85 and 131/82 ?

## 2021-12-10 NOTE — MAU Provider Note (Addendum)
?History  ?  ? ?CSN: [id number] ? ?Arrival date and time: 12/10/21 1019 ? ? None  ?  ? ?Chief Complaint  ?Patient presents with  ? Headache  ? ?HPI ? ?Kaitlin Klein is a 26 y.o. F G2P1001 w/ pmh T2DM, HTN, PCOS, who presents with elevated BPs since yesterday. Her BP yesterday were 133/95, 147/85, and 131/82. She was prompted to take her BP bc she started to have a severe HA (level 8-9), which she describes as throbbing and pounding, that becomes sharp upon standing, and localized to her R frontal lobe. She does not endorse any blurry vision, vision changes, weakness, numbness, chest pain, palpitations, or SOB. She does not have a h/o migraines. She took Tylenol PM last night, which did not help. She had a gastric sleeve in 2020 and was previously on Losartan for her HTN. She is currently not on any antihypertensives.  ?She last took her insulin yesterday morning. She has not taken any more insulin since bc of her baby shower yesterday. She reports that her normal fasting glucose is around 90s and her 2 hr fasting is around 120s.  ? ?OB History   ? ? Gravida  ?2  ? Para  ?1  ? Term  ?1  ? Preterm  ?   ? AB  ?   ? Living  ?1  ?  ? ? SAB  ?   ? IAB  ?   ? Ectopic  ?   ? Multiple  ?0  ? Live Births  ?1  ?   ?  ? Obstetric Comments  ?IND due to BP  ?  ? ?  ? ? ?Past Medical History:  ?Diagnosis Date  ? Asthma   ? hasnt used inhaler in long time per mother  ? Depression   ? better since on Zoloft  ? Diabetes ([place])   ? Diabetes mellitus, type II ([place])   ? Headache   ? Hypertension   ? Obesity   ? Polycystic ovarian syndrome   ? Pregnant   ? Urinary tract infection   ? ? ?Past Surgical History:  ?Procedure Laterality Date  ? LAPAROSCOPIC GASTRIC SLEEVE RESECTION    ? ? ?Family History  ?Problem Relation Age of Onset  ? Hypertension Mother   ? Diabetes Mother   ? Anxiety disorder Mother   ? Alcohol abuse Maternal Uncle   ? Diabetes Maternal Grandmother   ? Hypertension Maternal Grandmother   ? Thyroid cancer Maternal  Grandfather   ? Hypertension Maternal Grandfather   ? ? ?Social History  ? ?Tobacco Use  ? Smoking status: Former  ? Smokeless tobacco: Never  ?Vaping Use  ? Vaping Use: Former  ? Substances: Nicotine, Flavoring  ?Substance Use Topics  ? Alcohol use: No  ?  Alcohol/week: 0.0 standard drinks  ? Drug use: Not Currently  ?  Types: Marijuana  ? ? ?Allergies: No Known Allergies ? ?Medications Prior to Admission  ?Medication Sig Dispense Refill Last Dose  ? acetaminophen (TYLENOL) 325 MG tablet Take 2 tablets (650 mg total) by mouth every 4 (four) hours as needed for mild pain, moderate pain, fever or headache (for pain scale < 4). 30 tablet 1 Past Month  ? diphenhydramine-acetaminophen (TYLENOL PM) 25-500 MG TABS tablet Take 1 tablet by mouth at bedtime as needed.   12/09/2021 at 2030  ? insulin lispro (HUMALOG KWIKPEN) 100 UNIT/ML KwikPen Inject 12 Units into the skin 3 (three) times daily. With meals 15 mL 11 12/09/2021  ?  loratadine (CLARITIN) 10 MG tablet Take 10 mg by mouth daily.   12/09/2021  ? Pediatric Multivit-Minerals-C (FLINTSTONES GUMMIES COMPLETE) CHEW Chew 2 tablets by mouth daily.   12/09/2021  ? sertraline (ZOLOFT) 25 MG tablet Take 1 tablet (25 mg total) by mouth daily. 30 tablet 6 12/09/2021  ? Accu-Chek Softclix Lancets lancets Use as instructed to check blood sugar 4 times daily 100 each PRN   ? aspirin 81 MG EC tablet Take 2 tablets (162 mg total) by mouth daily. Swallow whole. 180 tablet 2   ? Blood Glucose Monitoring Suppl (ACCU-CHEK GUIDE ME) w/Device KIT 1 each by Does not apply route 4 (four) times daily. 1 kit 0   ? Blood Pressure Monitoring (BLOOD PRESSURE CUFF) MISC 1 kit by Does not apply route daily. 1 each 0   ? glucose blood (ACCU-CHEK GUIDE) test strip Use as instructed to check blood sugar 4 times daily 50 each PRN   ? insulin glargine (LANTUS) 100 unit/mL SOPN Inject 20 Units into the skin at bedtime. (Patient not taking: Reported on 12/07/2021) 15 mL 11   ? insulin NPH Human (HUMULIN N)  100 UNIT/ML injection Inject 20 units in the morning and 10 units at bedtime (Patient not taking: Reported on 11/15/2021) 10 mL 11   ? Insulin Pen Needle (PEN NEEDLES) 31G X 6 MM MISC Please dispense pen needles according to insurance preference 100 each 11   ? ? ?Review of Systems  ?Constitutional: Negative.   ?HENT: Negative.    ?Eyes: Negative.   ?Respiratory: Negative.    ?Cardiovascular: Negative.   ?Gastrointestinal: Negative.   ?Skin: Negative.   ?Neurological:  Positive for headaches.  ? ?Physical Exam  ? ?Blood pressure 122/83, pulse (!) 119, temperature 97.6 ?F (36.4 ?C), temperature source Oral, resp. rate 18, height 5' 6"  (1.676 m), weight 101.3 kg, last menstrual period 04/13/2021, SpO2 98 %, not currently breastfeeding. ? ?Physical Exam ?Constitutional:   ?   Appearance: She is obese.  ?HENT:  ?   Head: Normocephalic and atraumatic.  ?Eyes:  ?   Extraocular Movements: Extraocular movements intact.  ?Cardiovascular:  ?   Rate and Rhythm: Normal rate and regular rhythm.  ?Pulmonary:  ?   Effort: Pulmonary effort is normal.  ?   Breath sounds: Normal breath sounds.  ?Abdominal:  ?   Palpations: Abdomen is soft.  ?Skin: ?   General: Skin is warm and dry.  ?Neurological:  ?   Mental Status: She is alert.  ? ?  ? ?MAU Course  ?Procedures ? ?Results for orders placed or performed during the hospital encounter of 12/10/21 (from the past 24 hour(s))  ?Urinalysis, Routine w reflex microscopic Urine, Clean Catch     Status: Abnormal  ? Collection Time: 12/10/21 10:33 AM  ?Result Value Ref Range  ? Color, Urine YELLOW YELLOW  ? APPearance CLEAR CLEAR  ? Specific Gravity, Urine 1.033 (H) 1.005 - 1.030  ? pH 6.0 5.0 - 8.0  ? Glucose, UA >=500 (A) NEGATIVE mg/dL  ? Hgb urine dipstick NEGATIVE NEGATIVE  ? Bilirubin Urine NEGATIVE NEGATIVE  ? Ketones, ur 80 (A) NEGATIVE mg/dL  ? Protein, ur NEGATIVE NEGATIVE mg/dL  ? Nitrite NEGATIVE NEGATIVE  ? Leukocytes,Ua TRACE (A) NEGATIVE  ? RBC / HPF 0-5 0 - 5 RBC/hpf  ? WBC, UA  0-5 0 - 5 WBC/hpf  ? Bacteria, UA RARE (A) NONE SEEN  ? Squamous Epithelial / LPF 0-5 0 - 5  ?Glucose, capillary  Status: Abnormal  ? Collection Time: 12/10/21 11:33 AM  ?Result Value Ref Range  ? Glucose-Capillary 290 (H) 70 - 99 mg/dL  ?  ? ?  ? ?Assessment and Plan  ?Headache: UA shows glucose and ketones, concerning for DKA. Capillary glucose 290. Due to pt's h/o HTN, pre-eclampsia with severe features is also on the differential ?- f/u CBC, CMP, protein/cr ratio ?- HA cocktail  ?- start IV LR 1L and insulin aspart 10 units ? ? ?Armanda Magic MS3 ?12/10/2021, 11:54 AM  ? ? ? ? ? ?Attestation of Supervision of Student:  I confirm that I have verified the information documented in the medical student?s note and that I have also personally performed the history, physical exam and all medical decision making activities.  I have verified that all services and findings are accurately documented in this student's note; and I agree with management and plan as outlined in the documentation. I have also made any necessary editorial changes. ? ?History ?Kaitlin Klein is a 26 y.o. G2P1001 at 36w4dwho presents for headache. Headache started yesterday. Reports frontal headache that is throbbing in nature. Rates 8/10. Took 1 tylenol yesterday. Hasn't taken anything for it today. Reports elevated BPs at home yesterday. Denies visual disturbance or epigastric pain. Denies abdominal pain, n/v, chest pain, or SOB. Reports good fetal movement.  ? ?Physical exam ?Patient Vitals for the past 24 hrs: ? BP Temp Temp src Pulse Resp SpO2 Height Weight  ?12/10/21 1401 (!) 111/55 -- -- 95 -- -- -- --  ?12/10/21 1230 130/78 -- -- 97 -- 99 % -- --  ?12/10/21 1216 131/80 -- -- (!) 104 -- -- -- --  ?12/10/21 1200 124/82 -- -- (!) 110 -- 98 % -- --  ?12/10/21 1115 122/83 -- -- (!) 119 -- 98 % -- --  ?12/10/21 1100 128/72 -- -- (!) 121 -- 97 % -- --  ?12/10/21 1049 117/85 -- -- (!) 127 -- -- -- --  ?12/10/21 1039 132/70 97.6 ?F (36.4 ?C)  Oral (!) 130 18 99 % -- --  ?12/10/21 1032 -- -- -- -- -- -- 5' 6"  (1.676 m) 101.3 kg  ? ? ?Physical Examination: General appearance - alert, well appearing, and in no distress ?Mental status - normal mood, beha

## 2021-12-10 NOTE — Discharge Instructions (Signed)
For prevention of migraines in pregnancy: -Magnesium, 400mg by mouth, once daily -Vitamin B2, 400mg by mouth, once daily  For treatment of migraines in pregnancy: -take medication at the first sign of the pain of a headache, or the first sign of your aura -start with 1000mg Tylenol (or excedrin tension headache with acetaminophen & caffeine only, NOT aspirin) with or without Reglan 10mg -if no relief after 1-2hours, can take Flexeril 10mg -Do not take more than 4000 mg of tylenol (acetaminophen) per day  

## 2021-12-11 ENCOUNTER — Other Ambulatory Visit: Payer: Medicaid Other | Admitting: Women's Health

## 2021-12-11 ENCOUNTER — Other Ambulatory Visit: Payer: Medicaid Other

## 2021-12-11 ENCOUNTER — Telehealth: Payer: Self-pay | Admitting: *Deleted

## 2021-12-11 NOTE — Telephone Encounter (Signed)
Pt called and said that her sugars are elevated and she wants to be induced. Pt said she did not take a fasting blood sugar  and woke up around 11 or 12 and ate pancakes, sausage and eggs Her sugar after that was 351. Pt states that Dr. Despina Hidden told her to only take the Lantus at bedtime. I discussed patient with Dr. Charlotta Newton she advised taking 10 units of Novolog if blood sugars are greater than 200. Also advised that we do not induce before baby is term due to blood sugars. Pt scheduled for nst and provider visit on Friday. No other questions at this time.  ?

## 2021-12-14 ENCOUNTER — Ambulatory Visit (INDEPENDENT_AMBULATORY_CARE_PROVIDER_SITE_OTHER): Payer: Medicaid Other | Admitting: Obstetrics and Gynecology

## 2021-12-14 ENCOUNTER — Encounter: Payer: Self-pay | Admitting: Obstetrics and Gynecology

## 2021-12-14 ENCOUNTER — Inpatient Hospital Stay (HOSPITAL_COMMUNITY)
Admission: AD | Admit: 2021-12-14 | Payer: Medicaid Other | Source: Home / Self Care | Admitting: Obstetrics and Gynecology

## 2021-12-14 ENCOUNTER — Other Ambulatory Visit: Payer: Medicaid Other

## 2021-12-14 VITALS — BP 109/75 | HR 99 | Wt 224.0 lb

## 2021-12-14 DIAGNOSIS — Z331 Pregnant state, incidental: Secondary | ICD-10-CM

## 2021-12-14 DIAGNOSIS — O24414 Gestational diabetes mellitus in pregnancy, insulin controlled: Secondary | ICD-10-CM

## 2021-12-14 DIAGNOSIS — O10919 Unspecified pre-existing hypertension complicating pregnancy, unspecified trimester: Secondary | ICD-10-CM | POA: Insufficient documentation

## 2021-12-14 DIAGNOSIS — O0990 Supervision of high risk pregnancy, unspecified, unspecified trimester: Secondary | ICD-10-CM

## 2021-12-14 DIAGNOSIS — Z1389 Encounter for screening for other disorder: Secondary | ICD-10-CM

## 2021-12-14 LAB — POCT URINALYSIS DIPSTICK OB
Blood, UA: NEGATIVE
Leukocytes, UA: NEGATIVE
Nitrite, UA: NEGATIVE

## 2021-12-14 LAB — GLUCOSE, POCT (MANUAL RESULT ENTRY): POC Glucose: 222 mg/dl — AB (ref 70–99)

## 2021-12-14 NOTE — Progress Notes (Signed)
Subjective:  ?AULANI Kaitlin Klein is a 26 y.o. G2P1001 at [redacted]w[redacted]d being seen today for ongoing prenatal care.  She is currently monitored for the following issues for this high-risk pregnancy and has Adjustment disorder with depressed mood; MDD (major depressive disorder), recurrent episode, severe ([place]); Severe episode of recurrent major depressive disorder, without psychotic features ([place]); History of diabetes mellitus, type II; History of bariatric surgery; Asymptomatic bacteriuria during pregnancy in second trimester; Suicidal ideation; History of gestational diabetes; History of gestational hypertension & PPHTN; Supervision of high risk pregnancy, antepartum; Marijuana use; Depression with anxiety; Gestational diabetes; and Chronic hypertension affecting pregnancy on their problem list. ? ?Patient reports general discomforts of pregnancy.  Contractions: Not present.  .  Movement: Present. Denies leaking of fluid.  ? ?The following portions of the patient's history were reviewed and updated as appropriate: allergies, current medications, past family history, past medical history, past social history, past surgical history and problem list. Problem list updated. ? ?Objective:  ? ?Vitals:  ? 12/14/21 1133  ?BP: 109/75  ?Pulse: 99  ?Weight: 224 lb (101.6 kg)  ? ? ?Fetal Status:     Movement: Present    ? ?General:  Alert, oriented and cooperative. Patient is in no acute distress.  ?Skin: Skin is warm and dry. No rash noted.   ?Cardiovascular: Normal heart rate noted  ?Respiratory: Normal respiratory effort, no problems with respiration noted  ?Abdomen: Soft, gravid, appropriate for gestational age. Pain/Pressure: Absent     ?Pelvic:  Cervical exam deferred        ?Extremities: Normal range of motion.  Edema: None  ?Mental Status: Normal mood and affect. Normal behavior. Normal judgment and thought content.  ? ?Urinalysis:     ? ?Assessment and Plan:  ?Pregnancy: G2P1001 at [redacted]w[redacted]d ? ?1. Supervision of high risk pregnancy,  antepartum ?Stable ? ?2. Pregnant state, incidental ? ? ?- POC Urinalysis Dipstick OB ? ?3. Screening for genitourinary condition ? ?- POC Urinalysis Dipstick OB ? ?4. Insulin controlled gestational diabetes mellitus (GDM) in third trimester ?CBG's 200-300 at home ?However has not been able to check CBG's last few days. Needs battery for meter ? ?- POCT glucose (manual entry) ? ?5. Chronic hypertension affecting pregnancy ?Stable ? ?Preterm labor symptoms and general obstetric precautions including but not limited to vaginal bleeding, contractions, leaking of fluid and fetal movement were reviewed in detail with the patient. ?Please refer to After Visit Summary for other counseling recommendations.  ?Return in about 1 week (around 12/21/2021) for OB visit, face to face, MD only. ? ?Due to uncontrol DM despite insulin. Will send to [facility] for direct admission to [facility] for glycemic control ?Dr Elgie Congo, second attending notified. Camera operator at [facility] notified as well. Pt was instructed to proceed to [facility] for direct admission. Pt verbalized understanding ?Chancy Milroy, MD ?

## 2021-12-18 ENCOUNTER — Ambulatory Visit (INDEPENDENT_AMBULATORY_CARE_PROVIDER_SITE_OTHER): Payer: Medicaid Other | Admitting: Obstetrics & Gynecology

## 2021-12-18 ENCOUNTER — Encounter: Payer: Self-pay | Admitting: Obstetrics & Gynecology

## 2021-12-18 VITALS — BP 123/79 | HR 104 | Wt 224.0 lb

## 2021-12-18 DIAGNOSIS — O24414 Gestational diabetes mellitus in pregnancy, insulin controlled: Secondary | ICD-10-CM | POA: Diagnosis not present

## 2021-12-18 DIAGNOSIS — Z1389 Encounter for screening for other disorder: Secondary | ICD-10-CM

## 2021-12-18 DIAGNOSIS — Z331 Pregnant state, incidental: Secondary | ICD-10-CM

## 2021-12-18 DIAGNOSIS — O0990 Supervision of high risk pregnancy, unspecified, unspecified trimester: Secondary | ICD-10-CM

## 2021-12-18 DIAGNOSIS — O10919 Unspecified pre-existing hypertension complicating pregnancy, unspecified trimester: Secondary | ICD-10-CM

## 2021-12-18 LAB — POCT URINALYSIS DIPSTICK OB
Blood, UA: NEGATIVE
Leukocytes, UA: NEGATIVE
Nitrite, UA: NEGATIVE
POC,PROTEIN,UA: NEGATIVE

## 2021-12-18 LAB — GLUCOSE, POCT (MANUAL RESULT ENTRY): POC Glucose: 217 mg/dl — AB (ref 70–99)

## 2021-12-18 NOTE — Progress Notes (Signed)
? ? ?HIGH-RISK PREGNANCY VISIT ?Patient name: Kaitlin Klein MRN [id number]  Date of birth: [birth year] ?Chief Complaint:   ?High Risk Gestation and Non-stress Test ? ?History of Present Illness:   ?Kaitlin Klein is a 26 y.o. G80P1001 female at [redacted]w[redacted]d with an Estimated Date of Delivery: 01/31/22 being seen today for ongoing management of a high-risk pregnancy complicated by Class B DM,  hx of GHTN.   ? ?Today she reports no complaints. Contractions: Not present.  .  Movement: Present. denies leaking of fluid.  ? ? ?  09/20/2021  ?  8:43 AM 08/06/2021  ? 10:15 AM 07/07/2020  ?  2:27 PM 06/15/2020  ? 10:00 AM 04/20/2020  ?  9:45 AM  ?Depression screen PHQ 2/9  ?Decreased Interest 2 2 0 3 2  ?Down, Depressed, Hopeless 0 1 0 3 3  ?PHQ - 2 Score 2 3 0 6 5  ?Altered sleeping 0 1  2 2   ?Tired, decreased energy 2 3  3 3   ?Change in appetite 1 2  0 3  ?Feeling bad or failure about yourself  3 0  0 3  ?Trouble concentrating 0 0  0 0  ?Moving slowly or fidgety/restless 0 0  0 0  ?Suicidal thoughts 0 0  0 0  ?PHQ-9 Score 8 9  11 16   ?Difficult doing work/chores    Not difficult at all Very difficult  ? ?  ? ?  09/20/2021  ?  8:48 AM 08/06/2021  ? 10:15 AM 06/15/2020  ? 10:02 AM 04/20/2020  ?  9:46 AM  ?GAD 7 : Generalized Anxiety Score  ?Nervous, Anxious, on Edge 1 1 3 2   ?Control/stop worrying 0 0 0 2  ?Worry too much - different things 1 0 0 3  ?Trouble relaxing 0 2 3 2   ?Restless 0 0 0 0  ?Easily annoyed or irritable 1 3 3 3   ?Afraid - awful might happen 0 0 0 0  ?Total GAD 7 Score 3 6 9 12   ?Anxiety Difficulty   Not difficult at all Very difficult  ? ? ? ?Review of Systems:   ?Pertinent items are noted in HPI ?Denies abnormal vaginal discharge w/ itching/odor/irritation, headaches, visual changes, shortness of breath, chest pain, abdominal pain, severe nausea/vomiting, or problems with urination or bowel movements unless otherwise stated above. ?Pertinent History Reviewed:  ?Reviewed past medical,surgical, social, obstetrical  and family history.  ?Reviewed problem list, medications and allergies. ?Physical Assessment:  ? ?Vitals:  ? 12/18/21 1118  ?BP: 123/79  ?Pulse: (!) 104  ?Weight: 224 lb (101.6 kg)  ?Body mass index is 36.15 kg/m?. ?     ?     Physical Examination:  ? General appearance: alert, well appearing, and in no distress ? Mental status: alert, oriented to person, place, and time ? Skin: warm & dry  ? Extremities: Edema: None  ?  Cardiovascular: normal heart rate noted ? Respiratory: normal respiratory effort, no distress ? Abdomen: gravid, soft, non-tender ? Pelvic: Cervical exam deferred        ? ?Fetal Status:     Movement: Present   ? ?Fetal Surveillance Testing today: Reactive NST ? ?Kaitlin Klein is at [redacted]w[redacted]d Estimated Date of Delivery: 01/31/22  ?NST being performed due to A2DM ? ?Today the NST is Reactive ? ?Fetal Monitoring:  Baseline: 140 bpm, Variability: Good {> 6 bpm), Accelerations: Reactive, and Decelerations: Absent   reactive ? ?The accelerations are >15 bpm and more than 2 in 20  minutes ? ?Final diagnosis:  Reactive NST ? ?Florian Buff, MD  ?  ? ?Chaperone: N/A   ? ?Results for orders placed or performed in visit on 12/18/21 (from the past 24 hour(s))  ?POC Urinalysis Dipstick OB  ? Collection Time: 12/18/21 11:47 AM  ?Result Value Ref Range  ? Color, UA    ? Clarity, UA    ? Glucose, UA Large (3+) (A) Negative  ? Bilirubin, UA    ? Ketones, UA large   ? Spec Grav, UA    ? Blood, UA neg   ? pH, UA    ? POC,PROTEIN,UA Negative Negative, Trace, Small (1+), Moderate (2+), Large (3+), 4+  ? Urobilinogen, UA    ? Nitrite, UA neg   ? Leukocytes, UA Negative Negative  ? Appearance    ? Odor    ?Results for orders placed or performed in visit on 12/18/21 (from the past 24 hour(s))  ?POCT glucose (manual entry)  ? Collection Time: 12/18/21 11:31 AM  ?Result Value Ref Range  ? POC Glucose 217 (A) 70 - 99 mg/dl  ?  ?Assessment & Plan:  ?High-risk pregnancy: G2P1001 at [redacted]w[redacted]d with an Estimated Date of Delivery: 01/31/22   ? ? ?  ICD-10-CM   ?1. Supervision of high risk pregnancy, antepartum  O09.90   ?  ?2. Pregnant state, incidental  Z33.1 POC Urinalysis Dipstick OB  ?  ?3. Screening for genitourinary condition  Z13.89 POC Urinalysis Dipstick OB  ?  ?4. Chronic hypertension affecting pregnancy  O10.919   ?  ?5. Insulin controlled gestational diabetes mellitus (GDM) in third trimester  O24.414   ? Lantus 20 at bedtime, humalog 12 qac, was confused over dosing, NPH was not available to her.  ?  ?  ? ? ?Meds: No orders of the defined types were placed in this encounter. ? ? ?Orders:  ?Orders Placed This Encounter  ?Procedures  ? POC Urinalysis Dipstick OB  ?  ? ?Labs/procedures today: NST ? ?Treatment Plan:  twice weekly surveillance ? ?Reviewed: Preterm labor symptoms and general obstetric precautions including but not limited to vaginal bleeding, contractions, leaking of fluid and fetal movement were reviewed in detail with the patient.  All questions were answered. Does have home bp cuff. Office bp cuff given: not applicable. Check bp daily, let us know if consistently >140 and/or >90. ? ?Follow-up: Return for keep scheduled. ? ? ?Future Appointments  ?Date Time Provider [place]  ?12/21/2021 11:50 AM [facility] NURSE [facility]  ?12/25/2021 10:50 AM [facility] NURSE [facility]  ?12/25/2021 11:10 AM Florian Buff, MD [facility]  ?12/28/2021 10:50 AM [facility] NURSE [facility]  ?01/01/2022 10:50 AM [facility] NURSE [facility]  ?01/01/2022 11:10 AM Roma Schanz, CNM [facility]  ?01/04/2022 11:30 AM [facility] NURSE [facility]  ?01/08/2022 10:50 AM [facility] NURSE [facility]  ?01/08/2022 11:10 AM Janyth Pupa, DO [facility]  ?01/11/2022 11:30 AM [facility] NURSE [facility]  ?01/15/2022 10:50 AM [facility] NURSE [facility]  ?01/15/2022 11:10 AM Janyth Pupa, DO [facility]  ?01/18/2022 11:10 AM [facility] NURSE [facility]  ?01/22/2022 10:50 AM [facility] NURSE [facility]  ?01/22/2022 11:10 AM Florian Buff, MD [facility]  ?01/25/2022 11:30 AM [facility] NURSE [facility]  ?01/29/2022 10:50 AM [facility] NURSE [facility]  ?01/29/2022 11:10 AM Janyth Pupa, DO [facility]  ?02/01/2022 11:10 AM [facility] NURSE [facility]  ? ? ?Orders Placed This Encounter  ?Procedures  ? POC Urinalysis Dipstick OB  ? ?  [place]  ?Attending Physician for the [facility] [place] ?[facility] ?12/18/2021 ?12:19 PM ? ?

## 2021-12-21 ENCOUNTER — Other Ambulatory Visit: Payer: Medicaid Other

## 2021-12-25 ENCOUNTER — Other Ambulatory Visit: Payer: Medicaid Other | Admitting: Obstetrics & Gynecology

## 2021-12-25 ENCOUNTER — Other Ambulatory Visit: Payer: Medicaid Other

## 2021-12-25 DIAGNOSIS — O219 Vomiting of pregnancy, unspecified: Secondary | ICD-10-CM | POA: Insufficient documentation

## 2021-12-25 NOTE — Progress Notes (Signed)
There are 2 episodes for this encounter for some reason, please see the other one listed ? ?Lazaro Arms, MD ?12/25/2021 ?2:30 PM ? ?

## 2021-12-26 ENCOUNTER — Encounter: Payer: Self-pay | Admitting: *Deleted

## 2021-12-28 ENCOUNTER — Other Ambulatory Visit: Payer: Medicaid Other

## 2021-12-31 ENCOUNTER — Other Ambulatory Visit: Payer: Self-pay | Admitting: Obstetrics & Gynecology

## 2021-12-31 DIAGNOSIS — O24113 Pre-existing diabetes mellitus, type 2, in pregnancy, third trimester: Secondary | ICD-10-CM

## 2021-12-31 DIAGNOSIS — O10919 Unspecified pre-existing hypertension complicating pregnancy, unspecified trimester: Secondary | ICD-10-CM

## 2022-01-01 ENCOUNTER — Ambulatory Visit (INDEPENDENT_AMBULATORY_CARE_PROVIDER_SITE_OTHER): Payer: Medicaid Other

## 2022-01-01 ENCOUNTER — Other Ambulatory Visit: Payer: Medicaid Other

## 2022-01-01 ENCOUNTER — Encounter: Payer: Self-pay | Admitting: Obstetrics & Gynecology

## 2022-01-01 ENCOUNTER — Ambulatory Visit (INDEPENDENT_AMBULATORY_CARE_PROVIDER_SITE_OTHER): Payer: Medicaid Other | Admitting: Obstetrics & Gynecology

## 2022-01-01 VITALS — BP 113/82 | HR 99 | Wt 221.8 lb

## 2022-01-01 DIAGNOSIS — O10919 Unspecified pre-existing hypertension complicating pregnancy, unspecified trimester: Secondary | ICD-10-CM | POA: Diagnosis not present

## 2022-01-01 DIAGNOSIS — Z331 Pregnant state, incidental: Secondary | ICD-10-CM

## 2022-01-01 DIAGNOSIS — O0990 Supervision of high risk pregnancy, unspecified, unspecified trimester: Secondary | ICD-10-CM

## 2022-01-01 DIAGNOSIS — O24113 Pre-existing diabetes mellitus, type 2, in pregnancy, third trimester: Secondary | ICD-10-CM | POA: Diagnosis not present

## 2022-01-01 DIAGNOSIS — Z1389 Encounter for screening for other disorder: Secondary | ICD-10-CM

## 2022-01-01 DIAGNOSIS — O24414 Gestational diabetes mellitus in pregnancy, insulin controlled: Secondary | ICD-10-CM

## 2022-01-01 LAB — POCT URINALYSIS DIPSTICK OB
Blood, UA: NEGATIVE
Glucose, UA: NEGATIVE
Nitrite, UA: NEGATIVE

## 2022-01-01 NOTE — Progress Notes (Signed)
? ? ?HIGH-RISK PREGNANCY VISIT ?Patient name: Kaitlin Klein MRN [id number]  Date of birth: 25-Jan-1996 ?Chief Complaint:   ?High Risk Gestation and Pregnancy Ultrasound ? ?History of Present Illness:   ?Kaitlin Klein is a 26 y.o. G79P1001 female at [redacted]w[redacted]d with an Estimated Date of Delivery: 01/31/22 being seen today for ongoing management of a high-risk pregnancy complicated by [id number] on lantus, humalog with reasonable control, CHTN no meds.   ? ?Today she reports no complaints. Contractions: Not present. Vag. Bleeding: None.  Movement: Present. denies leaking of fluid.  ? ? ?  09/20/2021  ?  8:43 AM 08/06/2021  ? 10:15 AM 07/07/2020  ?  2:27 PM 06/15/2020  ? 10:00 AM 04/20/2020  ?  9:45 AM  ?Depression screen PHQ 2/9  ?Decreased Interest 2 2 0 3 2  ?Down, Depressed, Hopeless 0 1 0 3 3  ?PHQ - 2 Score 2 3 0 6 5  ?Altered sleeping 0 1  2 2   ?Tired, decreased energy 2 3  3 3   ?Change in appetite 1 2  0 3  ?Feeling bad or failure about yourself  3 0  0 3  ?Trouble concentrating 0 0  0 0  ?Moving slowly or fidgety/restless 0 0  0 0  ?Suicidal thoughts 0 0  0 0  ?PHQ-9 Score 8 9  11 16   ?Difficult doing work/chores    Not difficult at all Very difficult  ? ?  ? ?  09/20/2021  ?  8:48 AM 08/06/2021  ? 10:15 AM 06/15/2020  ? 10:02 AM 04/20/2020  ?  9:46 AM  ?GAD 7 : Generalized Anxiety Score  ?Nervous, Anxious, on Edge 1 1 3 2   ?Control/stop worrying 0 0 0 2  ?Worry too much - different things 1 0 0 3  ?Trouble relaxing 0 2 3 2   ?Restless 0 0 0 0  ?Easily annoyed or irritable 1 3 3 3   ?Afraid - awful might happen 0 0 0 0  ?Total GAD 7 Score 3 6 9 12   ?Anxiety Difficulty   Not difficult at all Very difficult  ? ? ? ?Review of Systems:   ?Pertinent items are noted in HPI ?Denies abnormal vaginal discharge w/ itching/odor/irritation, headaches, visual changes, shortness of breath, chest pain, abdominal pain, severe nausea/vomiting, or problems with urination or bowel movements unless otherwise stated above. ?Pertinent History  Reviewed:  ?Reviewed past medical,surgical, social, obstetrical and family history.  ?Reviewed problem list, medications and allergies. ?Physical Assessment:  ? ?Vitals:  ? 01/01/22 0907  ?BP: 113/82  ?Pulse: 99  ?Weight: 221 lb 12.8 oz (100.6 kg)  ?Body mass index is 35.8 kg/m?. ?     ?     Physical Examination:  ? General appearance: alert, well appearing, and in no distress ? Mental status: alert, oriented to person, place, and time ? Skin: warm & dry  ? Extremities: Edema: None  ?  Cardiovascular: normal heart rate noted ? Respiratory: normal respiratory effort, no distress ? Abdomen: gravid, soft, non-tender ? Pelvic: Cervical exam deferred        ? ?Fetal Status:     Movement: Present   ? ?Fetal Surveillance Testing today: BPP 8/8 w/Dopplers  30%/EFW 86% ? ?Chaperone: N/A   ? ?Results for orders placed or performed in visit on 01/01/22 (from the past 24 hour(s))  ?POC Urinalysis Dipstick OB  ? Collection Time: 01/01/22  9:22 AM  ?Result Value Ref Range  ? Color, UA    ? Clarity,  UA    ? Glucose, UA Negative Negative  ? Bilirubin, UA    ? Ketones, UA moderate   ? Spec Grav, UA    ? Blood, UA neg   ? pH, UA    ? POC,PROTEIN,UA Trace Negative, Trace, Small (1+), Moderate (2+), Large (3+), 4+  ? Urobilinogen, UA    ? Nitrite, UA neg   ? Leukocytes, UA Moderate (2+) (A) Negative  ? Appearance    ? Odor    ?  ?Assessment & Plan:  ?High-risk pregnancy: G2P1001 at [redacted]w[redacted]d with an Estimated Date of Delivery: 01/31/22  ? ? ?  ICD-10-CM   ?1. Supervision of high risk pregnancy, antepartum  O09.90   ?  ?2. Insulin controlled gestational diabetes mellitus (GDM) in third trimester  O24.414   ? Lantus 20 qhs, humalog 12ac  ?  ?3. Chronic hypertension affecting pregnancy  O10.919   ? ASA 162 mg, no other meds, reassuring testing and Dopplers  ?  ?4. Pregnant state, incidental  Z33.1 POC Urinalysis Dipstick OB  ?  ?5. Screening for genitourinary condition  Z13.89 POC Urinalysis Dipstick OB  ?  ?  ? ? ? ?Meds: No orders of the  defined types were placed in this encounter. ? ? ?Orders:  ?Orders Placed This Encounter  ?Procedures  ? POC Urinalysis Dipstick OB  ?  ? ?Labs/procedures today: U/S ? ?Treatment Plan:  twice weekly surveillance with IOL 39 weeks or as clinically indicated ? ?Reviewed: Preterm labor symptoms and general obstetric precautions including but not limited to vaginal bleeding, contractions, leaking of fluid and fetal movement were reviewed in detail with the patient.  All questions were answered. Does have home bp cuff. Office bp cuff given: not applicable. Check bp daily, let us know if consistently >140 and/or >90. ? ?Follow-up: Return in about 3 days (around 01/04/2022) for NST, Nurse only. ? ? ?Future Appointments  ?Date Time Provider [place]  ?01/01/2022 11:10 AM Florian Buff, MD [facility]  ?01/04/2022 11:30 AM [facility] NURSE [facility]  ?01/08/2022 10:50 AM [facility] NURSE [facility]  ?01/08/2022 11:10 AM Janyth Pupa, DO [facility]  ?01/11/2022 11:30 AM [facility] NURSE [facility]  ?01/15/2022 10:50 AM [facility]UR[facility]  ?01/15/2022 11:10 AM Janyth Pupa, DO [facility]  ?01/18/2022 11:10 AM [facility] NURSE [facility]  ?01/22/2022 10:50 AM [facility] NURSE [facility]  ?01/22/2022 11:10 AM Florian Buff, MD [facility]  ?01/25/2022 11:30 AM [facility] NURSE [facility]  ?01/29/2022 10:50 AM [facility]URSE [facility]  ?01/29/2022 11:10 AM Janyth Pupa, DO [facility]  ?02/01/2022 11:10 AM [facility] NURSE [facility]  ? ? ?Orders Placed This Encounter  ?Procedures  ? POC Urinalysis Dipstick OB  ? ?[place]  ?Attending Physician for the [facility] [place] ?[place] ?01/01/2022 ?10:17 AM ? ?

## 2022-01-01 NOTE — Progress Notes (Signed)
[place] 35+5 wks,cephalic,BPP 8/8,FHR 161 bpm,anterior placenta gr 1,AFI 18.1 cm, RI .51,.57,.52=30%,EFW 3145 g 86%,AC 98% ?

## 2022-01-04 ENCOUNTER — Other Ambulatory Visit: Payer: Medicaid Other

## 2022-01-08 ENCOUNTER — Ambulatory Visit (INDEPENDENT_AMBULATORY_CARE_PROVIDER_SITE_OTHER): Payer: Medicaid Other | Admitting: Obstetrics & Gynecology

## 2022-01-08 ENCOUNTER — Other Ambulatory Visit: Payer: Medicaid Other

## 2022-01-08 ENCOUNTER — Encounter: Payer: Self-pay | Admitting: Obstetrics & Gynecology

## 2022-01-08 ENCOUNTER — Other Ambulatory Visit (HOSPITAL_COMMUNITY)
Admission: RE | Admit: 2022-01-08 | Discharge: 2022-01-08 | Disposition: A | Discharge status: Home / Self Care | Payer: Medicaid Other | Source: Ambulatory Visit | Attending: Obstetrics & Gynecology | Admitting: Obstetrics & Gynecology

## 2022-01-08 VITALS — BP 113/77 | HR 117 | Wt 225.0 lb

## 2022-01-08 DIAGNOSIS — Z331 Pregnant state, incidental: Secondary | ICD-10-CM

## 2022-01-08 DIAGNOSIS — O0990 Supervision of high risk pregnancy, unspecified, unspecified trimester: Secondary | ICD-10-CM

## 2022-01-08 DIAGNOSIS — Z1389 Encounter for screening for other disorder: Secondary | ICD-10-CM

## 2022-01-08 DIAGNOSIS — Z3A36 36 weeks gestation of pregnancy: Secondary | ICD-10-CM

## 2022-01-08 DIAGNOSIS — O288 Other abnormal findings on antenatal screening of mother: Secondary | ICD-10-CM

## 2022-01-08 LAB — POCT URINALYSIS DIPSTICK OB
Blood, UA: NEGATIVE
Clarity, UA: NEGATIVE
Leukocytes, UA: NEGATIVE
Nitrite, UA: NEGATIVE

## 2022-01-08 NOTE — Progress Notes (Signed)
? ?HIGH-RISK PREGNANCY VISIT ?Patient name: Kaitlin Klein MRN [id number]  Date of birth: 1996-08-06 ?Chief Complaint:   ?High Risk Gestation, Routine Prenatal Visit, and Non-stress Test (Culture today) ? ?History of Present Illness:   ?Kaitlin Klein is a 26 y.o. G68P1001 female at 3w5dwith an Estimated Date of Delivery: 01/31/22 being seen today for ongoing management of a high-risk pregnancy complicated by: ? ?GDMA2-   ?Fastings normal, all postprandials above 120 ?Currently on- ?NPH 20  ?Regular 12 with meals ? ?Chronic HTN- no meds currently   ? ?Today she reports no complaints.  ? ?Contractions: Irritability.  .  Movement: Present. denies leaking of fluid. Denies vaginal bleeding ? ? ?  09/20/2021  ?  8:43 AM 08/06/2021  ? 10:15 AM 07/07/2020  ?  2:27 PM 06/15/2020  ? 10:00 AM 04/20/2020  ?  9:45 AM  ?Depression screen PHQ 2/9  ?Decreased Interest 2 2 0 3 2  ?Down, Depressed, Hopeless 0 1 0 3 3  ?PHQ - 2 Score 2 3 0 6 5  ?Altered sleeping 0 1  2 2   ?Tired, decreased energy 2 3  3 3   ?Change in appetite 1 2  0 3  ?Feeling bad or failure about yourself  3 0  0 3  ?Trouble concentrating 0 0  0 0  ?Moving slowly or fidgety/restless 0 0  0 0  ?Suicidal thoughts 0 0  0 0  ?PHQ-9 Score 8 9  11 16   ?Difficult doing work/chores    Not difficult at all Very difficult  ? ? ? ?Current Outpatient Medications  ?Medication Instructions  ? Accu-Chek Softclix Lancets lancets Use as instructed to check blood sugar 4 times daily  ? acetaminophen (TYLENOL) 650 mg, Oral, Every 4 hours PRN  ? aspirin 162 mg, Oral, Daily, Swallow whole.  ? Blood Glucose Monitoring Suppl (ACCU-CHEK GUIDE ME) w/Device KIT 1 each, Does not apply, 4 times daily  ? Blood Pressure Monitoring (BLOOD PRESSURE CUFF) MISC 1 kit, Does not apply, Daily  ? cyclobenzaprine (FLEXERIL) 10 mg, Oral, 3 times daily PRN  ? diphenhydramine-acetaminophen (TYLENOL PM) 25-500 MG TABS tablet 1 tablet, At bedtime PRN  ? glucose blood (ACCU-CHEK GUIDE) test strip Use as  instructed to check blood sugar 4 times daily  ? insulin glargine (LANTUS) 20 Units, Subcutaneous, Daily at bedtime  ? insulin lispro (HUMALOG KWIKPEN) 12 Units, Subcutaneous, 3 times daily, With meals  ? Insulin Pen Needle (PEN NEEDLES) 31G X 6 MM MISC Please dispense pen needles according to insurance preference  ? loratadine (CLARITIN) 10 mg, Oral, Daily  ? metoCLOPramide (REGLAN) 10 mg, Oral, Every 8 hours PRN  ? Pediatric Multivit-Minerals-C (FLINTSTONES GUMMIES COMPLETE) CHEW 2 tablets, Oral, Daily  ? sertraline (ZOLOFT) 25 mg, Oral, Daily  ?  ? ?Review of Systems:   ?Pertinent items are noted in HPI ?Denies abnormal vaginal discharge w/ itching/odor/irritation, headaches, visual changes, shortness of breath, chest pain, abdominal pain, severe nausea/vomiting, or problems with urination or bowel movements unless otherwise stated above. ?Pertinent History Reviewed:  ?Reviewed past medical,surgical, social, obstetrical and family history.  ?Reviewed problem list, medications and allergies. ?Physical Assessment:  ? ?Vitals:  ? 01/08/22 1105  ?BP: 113/77  ?Pulse: (!) 117  ?Weight: 225 lb (102.1 kg)  ?Body mass index is 36.32 kg/m?. ?     ?     Physical Examination:  ? General appearance: alert, well appearing, and in no distress ? Mental status: normal mood, behavior, speech, dress, motor activity,  and thought processes ? Skin: warm & dry  ? Extremities: Edema: None  ?  Cardiovascular: normal heart rate noted ? Respiratory: normal respiratory effort, no distress ? Abdomen: gravid, soft, non-tender ? Pelvic: Cervical exam performed  Dilation: Closed Effacement (%): Thick Station: -3 vaginal swabs obtained ? ?Fetal Status:     Movement: Present Presentation: Vertex ? ?Fetal Surveillance Testing today: NST ? ?NST being performed due to chronic HTN, GDMA2 ? ? ?Fetal Monitoring:  Baseline: 130 bpm, Variability: moderate, Accelerations: present, The accelerations are >15 bpm and more than 2 in 20 minutes, and  Decelerations: Absent   ? ? ?Final diagnosis:   Reactive NST  ? ?Chaperone:  Dr. Nita Sells    ? ?Results for orders placed or performed in visit on 01/08/22 (from the past 24 hour(s))  ?POC Urinalysis Dipstick OB  ? Collection Time: 01/08/22 11:19 AM  ?Result Value Ref Range  ? Color, UA    ? Clarity, UA neg   ? Glucose, UA    ? Bilirubin, UA    ? Ketones, UA small   ? Spec Grav, UA    ? Blood, UA neg   ? pH, UA    ? POC,PROTEIN,UA Trace Negative, Trace, Small (1+), Moderate (2+), Large (3+), 4+  ? Urobilinogen, UA    ? Nitrite, UA neg   ? Leukocytes, UA Negative Negative  ? Appearance    ? Odor    ?  ? ?Assessment & Plan:  ?High-risk pregnancy: G2P1001 at 43w5dwith an Estimated Date of Delivery: 01/31/22  ? ?1) GDMA2 ?-increase meal coverage to 14u with meals, continue current dose of NPH ?-continue with antepartum testing ? ?2) chronic HTN ?Asymptomatic ?BP stable ? ?Meds: No orders of the defined types were placed in this encounter. ? ? ?Labs/procedures today: NST, GC/C, GBS collected ? ?Treatment Plan:  as outlined above, []  plan for IOL 39wk ? ?Reviewed: Preterm labor symptoms and general obstetric precautions including but not limited to vaginal bleeding, contractions, leaking of fluid and fetal movement were reviewed in detail with the patient.  All questions were answered. Pt has home bp cuff. Check bp weekly, let [place] if >140/90.  ? ?Follow-up: Return for Twice weekly as scheduled. ? ? ?Future Appointments  ?Date Time Provider [place] ?01/11/2022 11:30 AM [facility] NURSE [facility]  ?01/15/2022 10:50 AM [facility]  ?01/15/2022 11:10 AM BRoma Schanz CNM [facility]  ?01/18/2022 11:10 AM [facility]  ?01/22/2022 10:50 AM [facility]  ?01/22/2022 11:10 AM EFlorian Buff MD [facility]  ?01/25/2022 11:30 AM [facility]  ?01/29/2022 10:50 AM [facility]  ?01/29/2022 11:10 AM OJanyth Pupa DO [facility]  ?02/01/2022 11:10 AM [facility]  ? ? ?Orders Placed This Encounter  ?Procedures  ? Culture, beta strep (group b only)  ? POC Urinalysis Dipstick OB  ? ? ?JJanyth Pupa DO ?Attending [facility] ?[facility]? ? ? ?

## 2022-01-09 ENCOUNTER — Encounter: Payer: Self-pay | Admitting: Women's Health

## 2022-01-09 LAB — CERVICOVAGINAL ANCILLARY ONLY
Chlamydia: NEGATIVE
Comment: NEGATIVE
Comment: NORMAL
Neisseria Gonorrhea: NEGATIVE

## 2022-01-11 ENCOUNTER — Other Ambulatory Visit: Payer: Medicaid Other

## 2022-01-11 LAB — CULTURE, BETA STREP (GROUP B ONLY): Strep Gp B Culture: POSITIVE — AB

## 2022-01-15 ENCOUNTER — Other Ambulatory Visit: Payer: Medicaid Other | Admitting: Women's Health

## 2022-01-15 ENCOUNTER — Other Ambulatory Visit: Payer: Medicaid Other

## 2022-01-17 ENCOUNTER — Other Ambulatory Visit: Payer: Self-pay

## 2022-01-17 ENCOUNTER — Encounter (HOSPITAL_COMMUNITY): Payer: Self-pay | Admitting: Obstetrics & Gynecology

## 2022-01-17 ENCOUNTER — Inpatient Hospital Stay (HOSPITAL_COMMUNITY)
Admission: AD | Admit: 2022-01-17 | Discharge: 2022-01-20 | DRG: 806 | Disposition: A | Discharge status: Home / Self Care | Payer: Medicaid Other | Attending: Family Medicine | Admitting: Family Medicine

## 2022-01-17 DIAGNOSIS — O99324 Drug use complicating childbirth: Secondary | ICD-10-CM | POA: Diagnosis present

## 2022-01-17 DIAGNOSIS — O10919 Unspecified pre-existing hypertension complicating pregnancy, unspecified trimester: Secondary | ICD-10-CM | POA: Diagnosis present

## 2022-01-17 DIAGNOSIS — Z87891 Personal history of nicotine dependence: Secondary | ICD-10-CM

## 2022-01-17 DIAGNOSIS — O24419 Gestational diabetes mellitus in pregnancy, unspecified control: Secondary | ICD-10-CM | POA: Diagnosis present

## 2022-01-17 DIAGNOSIS — F332 Major depressive disorder, recurrent severe without psychotic features: Secondary | ICD-10-CM | POA: Diagnosis present

## 2022-01-17 DIAGNOSIS — R45851 Suicidal ideations: Secondary | ICD-10-CM

## 2022-01-17 DIAGNOSIS — F1290 Cannabis use, unspecified, uncomplicated: Secondary | ICD-10-CM | POA: Diagnosis present

## 2022-01-17 DIAGNOSIS — O99844 Bariatric surgery status complicating childbirth: Secondary | ICD-10-CM | POA: Diagnosis present

## 2022-01-17 DIAGNOSIS — O24424 Gestational diabetes mellitus in childbirth, insulin controlled: Principal | ICD-10-CM | POA: Diagnosis present

## 2022-01-17 DIAGNOSIS — Z8632 Personal history of gestational diabetes: Secondary | ICD-10-CM | POA: Diagnosis present

## 2022-01-17 DIAGNOSIS — O99824 Streptococcus B carrier state complicating childbirth: Secondary | ICD-10-CM | POA: Diagnosis present

## 2022-01-17 DIAGNOSIS — O0990 Supervision of high risk pregnancy, unspecified, unspecified trimester: Principal | ICD-10-CM

## 2022-01-17 DIAGNOSIS — O479 False labor, unspecified: Secondary | ICD-10-CM | POA: Diagnosis present

## 2022-01-17 DIAGNOSIS — Z3A38 38 weeks gestation of pregnancy: Secondary | ICD-10-CM

## 2022-01-17 DIAGNOSIS — O1002 Pre-existing essential hypertension complicating childbirth: Secondary | ICD-10-CM | POA: Diagnosis present

## 2022-01-17 DIAGNOSIS — Z9884 Bariatric surgery status: Secondary | ICD-10-CM

## 2022-01-17 NOTE — MAU Note (Signed)
.  Kaitlin Klein is a 26 y.o. at [redacted]w[redacted]d here in MAU reporting contractions since yesterday. Somewhat stronger now. No recent sve. Denies LOF or VB and reports good FM Onset of complaint: yesterday   Pain score: 8 Vitals:   01/17/22 2314 01/17/22 2315  BP:  125/79  Pulse: 81   Resp: 18   Temp: 98.5 F (36.9 C)   SpO2: 99%      FHT:145 Lab orders placed from triage:  none

## 2022-01-18 ENCOUNTER — Encounter (HOSPITAL_COMMUNITY): Payer: Self-pay | Admitting: Obstetrics & Gynecology

## 2022-01-18 ENCOUNTER — Inpatient Hospital Stay (HOSPITAL_COMMUNITY): Payer: Medicaid Other | Admitting: Anesthesiology

## 2022-01-18 ENCOUNTER — Other Ambulatory Visit: Payer: Medicaid Other

## 2022-01-18 DIAGNOSIS — O1002 Pre-existing essential hypertension complicating childbirth: Secondary | ICD-10-CM | POA: Diagnosis present

## 2022-01-18 DIAGNOSIS — O99324 Drug use complicating childbirth: Secondary | ICD-10-CM | POA: Diagnosis present

## 2022-01-18 DIAGNOSIS — Z3A38 38 weeks gestation of pregnancy: Secondary | ICD-10-CM | POA: Diagnosis not present

## 2022-01-18 DIAGNOSIS — Z87891 Personal history of nicotine dependence: Secondary | ICD-10-CM | POA: Diagnosis not present

## 2022-01-18 DIAGNOSIS — O99820 Streptococcus B carrier state complicating pregnancy: Secondary | ICD-10-CM | POA: Diagnosis not present

## 2022-01-18 DIAGNOSIS — O479 False labor, unspecified: Secondary | ICD-10-CM | POA: Diagnosis present

## 2022-01-18 DIAGNOSIS — F1290 Cannabis use, unspecified, uncomplicated: Secondary | ICD-10-CM | POA: Diagnosis present

## 2022-01-18 DIAGNOSIS — O99844 Bariatric surgery status complicating childbirth: Secondary | ICD-10-CM | POA: Diagnosis present

## 2022-01-18 DIAGNOSIS — O99824 Streptococcus B carrier state complicating childbirth: Secondary | ICD-10-CM | POA: Diagnosis present

## 2022-01-18 DIAGNOSIS — O26893 Other specified pregnancy related conditions, third trimester: Secondary | ICD-10-CM | POA: Diagnosis present

## 2022-01-18 DIAGNOSIS — O24424 Gestational diabetes mellitus in childbirth, insulin controlled: Secondary | ICD-10-CM | POA: Diagnosis present

## 2022-01-18 LAB — CBC
HCT: 37.7 % (ref 36.0–46.0)
Hemoglobin: 12.4 g/dL (ref 12.0–15.0)
MCH: 26.6 pg (ref 26.0–34.0)
MCHC: 32.9 g/dL (ref 30.0–36.0)
MCV: 80.9 fL (ref 80.0–100.0)
Platelets: 258 10*3/uL (ref 150–400)
RBC: 4.66 MIL/uL (ref 3.87–5.11)
RDW: 16.3 % — ABNORMAL HIGH (ref 11.5–15.5)
WBC: 12.9 10*3/uL — ABNORMAL HIGH (ref 4.0–10.5)
nRBC: 0 % (ref 0.0–0.2)

## 2022-01-18 LAB — TYPE AND SCREEN
ABO/RH(D): O POS
Antibody Screen: NEGATIVE

## 2022-01-18 LAB — RPR: RPR Ser Ql: NONREACTIVE

## 2022-01-18 LAB — GLUCOSE, CAPILLARY
Glucose-Capillary: 108 mg/dL — ABNORMAL HIGH (ref 70–99)
Glucose-Capillary: 152 mg/dL — ABNORMAL HIGH (ref 70–99)
Glucose-Capillary: 96 mg/dL (ref 70–99)

## 2022-01-18 MED ORDER — SOD CITRATE-CITRIC ACID 500-334 MG/5ML PO SOLN: 30.0000 mL | ORAL | Status: DC | PRN

## 2022-01-18 MED ORDER — PHENYLEPHRINE 80 MCG/ML (10ML) SYRINGE FOR IV PUSH (FOR BLOOD PRESSURE SUPPORT): 80.0000 ug | PREFILLED_SYRINGE | INTRAVENOUS | Status: DC | PRN

## 2022-01-18 MED ORDER — ONDANSETRON HCL 4 MG PO TABS
4.0000 mg | ORAL_TABLET | ORAL | Status: DC | PRN
Start: 2022-01-18 — End: 2022-01-20

## 2022-01-18 MED ORDER — OXYCODONE-ACETAMINOPHEN 5-325 MG PO TABS: 2.0000 | ORAL_TABLET | ORAL | Status: DC | PRN

## 2022-01-18 MED ORDER — LACTATED RINGERS IV SOLN: 500.0000 mL | INTRAVENOUS | Status: DC | PRN

## 2022-01-18 MED ORDER — LIDOCAINE HCL (PF) 1 % IJ SOLN
INTRAMUSCULAR | Status: DC | PRN
  Administered 2022-01-18 (×2): 4 mL via EPIDURAL

## 2022-01-18 MED ORDER — ACETAMINOPHEN 325 MG PO TABS: 650.0000 mg | ORAL_TABLET | ORAL | Status: DC | PRN

## 2022-01-18 MED ORDER — MEASLES, MUMPS & RUBELLA VAC IJ SOLR: 0.5000 mL | Freq: Once | INTRAMUSCULAR | Status: DC

## 2022-01-18 MED ORDER — OXYTOCIN-SODIUM CHLORIDE 30-0.9 UT/500ML-% IV SOLN
2.5000 [IU]/h | INTRAVENOUS | Status: DC
  Administered 2022-01-18: 2.5 [IU]/h via INTRAVENOUS
  Filled 2022-01-18: qty 500

## 2022-01-18 MED ORDER — ZOLPIDEM TARTRATE 5 MG PO TABS: 5.0000 mg | ORAL_TABLET | Freq: Every evening | ORAL | Status: DC | PRN

## 2022-01-18 MED ORDER — SIMETHICONE 80 MG PO CHEW: 80.0000 mg | CHEWABLE_TABLET | ORAL | Status: DC | PRN

## 2022-01-18 MED ORDER — LACTATED RINGERS IV SOLN: INTRAVENOUS | Status: DC

## 2022-01-18 MED ORDER — IBUPROFEN 600 MG PO TABS
600.0000 mg | ORAL_TABLET | Freq: Four times a day (QID) | ORAL | Status: DC
  Administered 2022-01-18 – 2022-01-20 (×7): 600 mg via ORAL
  Filled 2022-01-18 (×7): qty 1

## 2022-01-18 MED ORDER — WITCH HAZEL-GLYCERIN EX PADS
1.0000 "application " | MEDICATED_PAD | CUTANEOUS | Status: DC | PRN
  Administered 2022-01-18: 1 via TOPICAL

## 2022-01-18 MED ORDER — SODIUM CHLORIDE 0.9% FLUSH: 3.0000 mL | INTRAVENOUS | Status: DC | PRN

## 2022-01-18 MED ORDER — DIPHENHYDRAMINE HCL 25 MG PO CAPS: 25.0000 mg | ORAL_CAPSULE | Freq: Four times a day (QID) | ORAL | Status: DC | PRN

## 2022-01-18 MED ORDER — SODIUM CHLORIDE 0.9 % IV SOLN
2.0000 g | Freq: Once | INTRAVENOUS | Status: AC
  Administered 2022-01-18: 2 g via INTRAVENOUS
  Filled 2022-01-18: qty 2000

## 2022-01-18 MED ORDER — LACTATED RINGERS IV SOLN
500.0000 mL | Freq: Once | INTRAVENOUS | Status: AC
  Administered 2022-01-18: 500 mL via INTRAVENOUS

## 2022-01-18 MED ORDER — EPHEDRINE 5 MG/ML INJ: 10.0000 mg | INTRAVENOUS | Status: DC | PRN

## 2022-01-18 MED ORDER — DIPHENHYDRAMINE HCL 50 MG/ML IJ SOLN: 12.5000 mg | INTRAMUSCULAR | Status: DC | PRN

## 2022-01-18 MED ORDER — DIBUCAINE (PERIANAL) 1 % EX OINT: 1.0000 "application " | TOPICAL_OINTMENT | CUTANEOUS | Status: DC | PRN

## 2022-01-18 MED ORDER — LIDOCAINE HCL (PF) 1 % IJ SOLN: 30.0000 mL | INTRAMUSCULAR | Status: DC | PRN

## 2022-01-18 MED ORDER — SODIUM CHLORIDE 0.9% FLUSH: 3.0000 mL | Freq: Two times a day (BID) | INTRAVENOUS | Status: DC

## 2022-01-18 MED ORDER — BENZOCAINE-MENTHOL 20-0.5 % EX AERO
1.0000 "application " | INHALATION_SPRAY | CUTANEOUS | Status: DC | PRN
  Administered 2022-01-18: 1 via TOPICAL
  Filled 2022-01-18: qty 56

## 2022-01-18 MED ORDER — OXYTOCIN BOLUS FROM INFUSION
333.0000 mL | Freq: Once | INTRAVENOUS | Status: AC
  Administered 2022-01-18: 333 mL via INTRAVENOUS

## 2022-01-18 MED ORDER — ONDANSETRON HCL 4 MG/2ML IJ SOLN: 4.0000 mg | Freq: Four times a day (QID) | INTRAMUSCULAR | Status: DC | PRN

## 2022-01-18 MED ORDER — SENNOSIDES-DOCUSATE SODIUM 8.6-50 MG PO TABS
2.0000 | ORAL_TABLET | ORAL | Status: DC
  Administered 2022-01-19 – 2022-01-20 (×2): 2 via ORAL
  Filled 2022-01-18 (×2): qty 2

## 2022-01-18 MED ORDER — SODIUM CHLORIDE 0.9 % IV SOLN
1.0000 g | INTRAVENOUS | Status: DC
  Administered 2022-01-18 (×2): 1 g via INTRAVENOUS
  Filled 2022-01-18 (×2): qty 1000

## 2022-01-18 MED ORDER — OXYCODONE-ACETAMINOPHEN 5-325 MG PO TABS: 1.0000 | ORAL_TABLET | ORAL | Status: DC | PRN

## 2022-01-18 MED ORDER — FUROSEMIDE 20 MG PO TABS
20.0000 mg | ORAL_TABLET | Freq: Every day | ORAL | Status: DC
  Administered 2022-01-19 – 2022-01-20 (×2): 20 mg via ORAL
  Filled 2022-01-18 (×2): qty 1

## 2022-01-18 MED ORDER — FENTANYL-BUPIVACAINE-NACL 0.5-0.125-0.9 MG/250ML-% EP SOLN
12.0000 mL/h | EPIDURAL | Status: DC | PRN
  Administered 2022-01-18: 12 mL/h via EPIDURAL
  Filled 2022-01-18: qty 250

## 2022-01-18 MED ORDER — ACETAMINOPHEN 325 MG PO TABS
650.0000 mg | ORAL_TABLET | ORAL | Status: DC | PRN
Start: 2022-01-18 — End: 2022-01-20

## 2022-01-18 MED ORDER — ONDANSETRON HCL 4 MG/2ML IJ SOLN
4.0000 mg | INTRAMUSCULAR | Status: DC | PRN
Start: 2022-01-18 — End: 2022-01-20

## 2022-01-18 MED ORDER — SODIUM CHLORIDE 0.9 % IV SOLN: 250.0000 mL | INTRAVENOUS | Status: DC | PRN

## 2022-01-18 MED ORDER — COCONUT OIL OIL: 1.0000 "application " | TOPICAL_OIL | Status: DC | PRN

## 2022-01-18 MED ORDER — PRENATAL MULTIVITAMIN CH
1.0000 | ORAL_TABLET | Freq: Every day | ORAL | Status: DC
  Administered 2022-01-19: 1 via ORAL
  Filled 2022-01-18: qty 1

## 2022-01-18 MED ORDER — TETANUS-DIPHTH-ACELL PERTUSSIS 5-2.5-18.5 LF-MCG/0.5 IM SUSY: 0.5000 mL | PREFILLED_SYRINGE | Freq: Once | INTRAMUSCULAR | Status: DC

## 2022-01-18 NOTE — H&P (Signed)
Kaitlin Klein is a 27 y.o. female presenting for painful uterine contractions   Denies leaking or bleeding.  Pregnancy has been followed at [facility] for :  Patient Active Problem List   Diagnosis Date Noted   Uterine contractions during pregnancy 01/18/2022   Nausea and vomiting during pregnancy 12/25/2021   Chronic hypertension affecting pregnancy 12/14/2021   Gestational diabetes 11/06/2021   Depression with anxiety 09/10/2021   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   Supervision of high risk pregnancy, antepartum 08/06/2021   History of gestational hypertension & PPHTN 09/06/2020   History of gestational diabetes 06/19/2020   Suicidal ideation 03/30/2020   Asymptomatic bacteriuria during pregnancy in second trimester 03/13/2020   History of diabetes mellitus, type II 01/13/2020   History of bariatric surgery 10/12/2019   Severe episode of recurrent major depressive disorder, without psychotic features (HCC)    MDD (major depressive disorder), recurrent episode, severe (HCC) 08/09/2015   Adjustment disorder with depressed mood    Did not take her Dinner insulin (ate at 10pm) or her nighttime Lantus BPs have been stable throughout pregnancy  OB History     Gravida  2   Para  1   Term  1   Preterm      AB      Living  1      SAB      IAB      Ectopic      Multiple  0   Live Births  1        Obstetric Comments  IND due to BP        Past Medical History:  Diagnosis Date   Asthma    hasnt used inhaler in long time per mother   Depression    better since on Zoloft   Diabetes (HCC)    Diabetes mellitus, type II (HCC)    Headache    Hypertension    Obesity    Polycystic ovarian syndrome    Pregnant    Urinary tract infection    Past Surgical History:  Procedure Laterality Date   LAPAROSCOPIC GASTRIC SLEEVE RESECTION     Family History: family history includes Alcohol abuse in her maternal uncle; Anxiety disorder in her mother; Diabetes  in her maternal grandmother and mother; Hypertension in her maternal grandfather, maternal grandmother, and mother; Thyroid cancer in her maternal grandfather. Social History:  reports that she has quit smoking. She has never used smokeless tobacco. She reports that she does not currently use drugs after having used the following drugs: Marijuana. She reports that she does not drink alcohol.     Maternal Diabetes: Yes:  Diabetes Type:  Insulin/Medication controlled Genetic Screening: Normal Maternal Ultrasounds/Referrals: Normal Fetal Ultrasounds or other Referrals:  None Maternal Substance Abuse:  No Significant Maternal Medications:  None Significant Maternal Lab Results:  Group B Strep positive Other Comments:  None  Review of Systems  Constitutional:  Negative for chills and fever.  Eyes:  Negative for visual disturbance.  Respiratory:  Negative for shortness of breath.   Gastrointestinal:  Positive for abdominal pain. Negative for constipation, diarrhea, nausea and vomiting.  Genitourinary:  Positive for pelvic pain. Negative for vaginal bleeding.  Neurological:  Negative for weakness.  Maternal Medical History:  Reason for admission: Contractions.  Nausea.  Contractions: Frequency: regular.   Perceived severity is strong.   Fetal activity: Perceived fetal activity is normal.   Prenatal complications: No bleeding, PIH, pre-eclampsia or preterm labor.   Prenatal Complications -  Diabetes: gestational. Diabetes is managed by insulin injections.    Dilation: 4.5 Effacement (%): 80 Station: -3 Exam by:: Lamont Snowball, RN Blood pressure 138/89, pulse 90, temperature 98.4 F (36.9 C), temperature source Oral, resp. rate 18, height 5\' 6"  (1.676 m), weight 103.9 kg, last menstrual period 04/13/2021, SpO2 99 %, not currently breastfeeding. Maternal Exam:  Uterine Assessment: Contraction strength is firm.  Contraction frequency is regular.  Abdomen: Patient reports no abdominal  tenderness. Fetal presentation: vertex Introitus: Normal vulva. Normal vagina.  Pelvis: adequate for delivery.   Cervix: Cervix evaluated by digital exam.     Fetal Exam Fetal Monitor Review: Mode: ultrasound.   Baseline rate: 135.  Variability: moderate (6-25 bpm).   Pattern: accelerations present.   Fetal State Assessment: Category I - tracings are normal.  Physical Exam Constitutional:      General: She is not in acute distress.    Appearance: She is not ill-appearing or toxic-appearing.  HENT:     Head: Normocephalic.  Cardiovascular:     Rate and Rhythm: Normal rate.  Pulmonary:     Effort: Pulmonary effort is normal.  Abdominal:     General: There is no distension.     Tenderness: There is no abdominal tenderness.  Genitourinary:    General: Normal vulva.  Musculoskeletal:        General: Normal range of motion.     Cervical back: Normal range of motion.  Skin:    General: Skin is warm and dry.  Neurological:     General: No focal deficit present.     Mental Status: She is alert.  Psychiatric:        Mood and Affect: Mood normal.    Prenatal labs: ABO, Rh: O/Positive/-- (11/22 1558) Antibody: Negative (03/13 0901) Rubella: 5.69 (11/22 1558) RPR: Non Reactive (03/13 0901)  HBsAg: Negative (11/22 1558)  HIV: Non Reactive (03/13 0901)  GBS: Positive/-- (05/16 1500)   Assessment/Plan: Single IUP at [redacted]w[redacted]d Active Labor Gestational Diabetes, Insulin controlled Chronic hypertension, BPs well controlled  Admit to Labor and Delivery Routine orders CBG q4h Anticipate SVD   [redacted]w[redacted]d 01/18/2022, 2:10 AM

## 2022-01-18 NOTE — Anesthesia Procedure Notes (Signed)
Epidural Patient location during procedure: OB Start time: 01/18/2022 3:23 AM End time: 01/18/2022 3:26 AM  Staffing Anesthesiologist: Kaylyn Layer, MD Performed: anesthesiologist   Preanesthetic Checklist Completed: patient identified, IV checked, risks and benefits discussed, monitors and equipment checked, pre-op evaluation and timeout performed  Epidural Patient position: sitting Prep: DuraPrep and site prepped and draped Patient monitoring: continuous pulse ox, blood pressure and heart rate Approach: midline Location: L3-L4 Injection technique: LOR air  Needle:  Needle type: Tuohy  Needle gauge: 17 G Needle length: 9 cm Needle insertion depth: 8 cm Catheter type: closed end flexible Catheter size: 19 Gauge Catheter at skin depth: 14 cm Test dose: negative and Other (1% lidocaine)  Assessment Events: blood not aspirated, injection not painful, no injection resistance, no paresthesia and negative IV test  Additional Notes Patient identified. Risks, benefits, and alternatives discussed with patient including but not limited to bleeding, infection, nerve damage, paralysis, failed block, incomplete pain control, headache, blood pressure changes, nausea, vomiting, reactions to medication, itching, and postpartum back pain. Confirmed with bedside nurse the patient's most recent platelet count. Confirmed with patient that they are not currently taking any anticoagulation, have any bleeding history, or any family history of bleeding disorders. Patient expressed understanding and wished to proceed. All questions were answered. Sterile technique was used throughout the entire procedure. Please see nursing notes for vital signs.   Crisp LOR after one needle redirection. Test dose was given through epidural catheter and negative prior to continuing to dose epidural or start infusion. Warning signs of high block given to the patient including shortness of breath, tingling/numbness in  hands, complete motor block, or any concerning symptoms with instructions to call for help. Patient was given instructions on fall risk and not to get out of bed. All questions and concerns addressed with instructions to call with any issues or inadequate analgesia.  Reason for block:procedure for pain

## 2022-01-18 NOTE — Progress Notes (Signed)
Labor Progress Note Kaitlin Klein is a 26 y.o. G2P1001 at [redacted]w[redacted]d presented for SOL.   S: comfortable with epidural. Wants her water broken.   O:  BP 120/80   Pulse 86   Temp 98.3 F (36.8 C) (Oral)   Resp 16   Ht 5\' 6"  (1.676 m)   Wt 103.9 kg   LMP 04/13/2021 (Approximate)   SpO2 99%   BMI 36.96 kg/m  EFM: 130/mod/15x15/none   CVE: Dilation: 6 Effacement (%): 80 Cervical Position: Posterior Station: -2 Presentation: Vertex Exam by:: Dr. [email or web address]   A&P: 26 y.o. G2P1001 [redacted]w[redacted]d  #Labor: Cervix largely unchanged, but head nicely applied. After verbal consent, performed AROM with moderate amount of clear fluid. Head still well applied afterwards.  #Pain: Epidural  #FWB: Cat I  #GBS positive  [redacted]w[redacted]d, DO 12:30 PM

## 2022-01-18 NOTE — Discharge Summary (Signed)
   Postpartum Discharge Summary  Date of Service updated***     Patient Name: Kaitlin Klein DOB: 02/11/1996 MRN: [id number]  Date of admission: 01/17/2022 Delivery date:01/18/2022  Delivering provider: BEARD, SAMANTHA N  Date of discharge: 01/18/2022  Admitting diagnosis: Uterine contractions during pregnancy [O47.9] Intrauterine pregnancy: [redacted]w[redacted]d     Secondary diagnosis:  Principal Problem:   Uterine contractions during pregnancy Active Problems:   Severe episode of recurrent major depressive disorder, without psychotic features (HCC)   History of bariatric surgery   Suicidal ideation   Marijuana use   Gestational diabetes   Chronic hypertension affecting pregnancy  Additional problems: ***    Discharge diagnosis: Term Pregnancy Delivered, CHTN, and GDM A2                                              Post partum procedures:{Postpartum procedures:23558} Augmentation: AROM Complications: None  Hospital course: Onset of Labor With Vaginal Delivery      26 y.o. yo G2P2002 at [redacted]w[redacted]d was admitted in Active Labor on 01/17/2022. Patient had an uncomplicated labor course as follows:  Membrane Rupture Time/Date: 12:00 PM ,01/18/2022   Delivery Method:Vaginal, Spontaneous  Episiotomy: None  Lacerations:  1st degree;Labial  Patient had an uncomplicated postpartum course.  She is ambulating, tolerating a regular diet, passing flatus, and urinating well. Patient is discharged home in stable condition on 01/18/22.  Newborn Data: Birth date:01/18/2022  Birth time:2:32 PM  Gender:Female  Living status:Living  Apgars:7 ,8  Weight:3941 g   Magnesium Sulfate received: No BMZ received: No Rhophylac:No MMR:No T-DaP:Given prenatally Flu: No Transfusion:{Transfusion received:30440034}  Physical exam  Vitals:   01/18/22 1600 01/18/22 1615 01/18/22 1640 01/18/22 1800  BP: 112/67 122/75 126/83 129/70  Pulse: 72 90 78 75  Resp: 16 18 18 16  Temp:   98.6 F (37 C) 98.1 F (36.7 C)   TempSrc:   Oral   SpO2:   100%   Weight:      Height:       General: {Exam; general:21111117} Lochia: {Desc; appropriate/inappropriate:30686::"appropriate"} Uterine Fundus: {Desc; firm/soft:30687} Incision: {Exam; incision:21111123} DVT Evaluation: {Exam; dvt:2111122} Labs: Lab Results  Component Value Date   WBC 12.9 (H) 01/18/2022   HGB 12.4 01/18/2022   HCT 37.7 01/18/2022   MCV 80.9 01/18/2022   PLT 258 01/18/2022      Latest Ref Rng & Units 12/10/2021   11:48 AM  CMP  Glucose 70 - 99 mg/dL 282    BUN 6 - 20 mg/dL <5    Creatinine 0.44 - 1.00 mg/dL 0.58    Sodium 135 - 145 mmol/L 135    Potassium 3.5 - 5.1 mmol/L 3.4    Chloride 98 - 111 mmol/L 107    CO2 22 - 32 mmol/L 20    Calcium 8.9 - 10.3 mg/dL 8.7    Total Protein 6.5 - 8.1 g/dL 6.1    Total Bilirubin 0.3 - 1.2 mg/dL 0.9    Alkaline Phos 38 - 126 U/L 82    AST 15 - 41 U/L 10    ALT 0 - 44 U/L 10     Edinburgh Score:    10/13/2020   11:56 AM  Edinburgh Postnatal Depression Scale Screening Tool  I have been able to laugh and see the funny side of things. 0  I have looked forward with enjoyment to things. 0    I have blamed myself unnecessarily when things went wrong. 1  I have been anxious or worried for no good reason. 2  I have felt scared or panicky for no good reason. 0  Things have been getting on top of me. 1  I have been so unhappy that I have had difficulty sleeping. 1  I have felt sad or miserable. 1  I have been so unhappy that I have been crying. 1  The thought of harming myself has occurred to me. 1  Edinburgh Postnatal Depression Scale Total 8     After visit meds:  Allergies as of 01/18/2022   No Known Allergies   Med Rec must be completed prior to using this SMARTLINK***        Discharge home in stable condition Infant Feeding: {Baby feeding:23562} Infant Disposition:{CHL IP OB HOME WITH MOTHER:23581} Discharge instruction: per After Visit Summary and Postpartum  booklet. Activity: Advance as tolerated. Pelvic rest for 6 weeks.  Diet: {OB diet:21111121} Future Appointments:No future appointments. Follow up Visit:  Message sent to FT by Dr Beard:  Please schedule this patient for a In person postpartum visit in 6 weeks with the following provider: Any provider. Additional Postpartum F/U:Postpartum Depression checkup, 2 hour GTT, and BP check 1 week  High risk pregnancy complicated by: GDM and HTN Delivery mode:  Vaginal, Spontaneous  Anticipated Birth Control:  IUD   01/18/2022 Samantha N Beard, DO    

## 2022-01-18 NOTE — Anesthesia Preprocedure Evaluation (Signed)
Anesthesia Evaluation  Patient identified by MRN, date of birth, ID band Patient awake    Reviewed: Allergy & Precautions, Patient's Chart, lab work & pertinent test results  History of Anesthesia Complications Negative for: history of anesthetic complications  Airway Mallampati: II  TM Distance: >3 FB Neck ROM: Full    Dental no notable dental hx.    Pulmonary asthma , former smoker,    Pulmonary exam normal        Cardiovascular hypertension, Normal cardiovascular exam     Neuro/Psych  Headaches, Anxiety Depression    GI/Hepatic negative GI ROS, Neg liver ROS,   Endo/Other  diabetes, Type 2, Insulin Dependent  Renal/GU negative Renal ROS  negative genitourinary   Musculoskeletal negative musculoskeletal ROS (+)   Abdominal   Peds  Hematology negative hematology ROS (+)   Anesthesia Other Findings Day of surgery medications reviewed with patient.  Reproductive/Obstetrics negative OB ROS                             Anesthesia Physical Anesthesia Plan  ASA: 3  Anesthesia Plan: Epidural   Post-op Pain Management:    Induction:   PONV Risk Score and Plan: Treatment may vary due to age or medical condition  Airway Management Planned: Natural Airway  Additional Equipment: Fetal Monitoring  Intra-op Plan:   Post-operative Plan:   Informed Consent: I have reviewed the patients History and Physical, chart, labs and discussed the procedure including the risks, benefits and alternatives for the proposed anesthesia with the patient or authorized representative who has indicated his/her understanding and acceptance.       Plan Discussed with:   Anesthesia Plan Comments:         Anesthesia Quick Evaluation

## 2022-01-19 LAB — CBC
HCT: 34.7 % — ABNORMAL LOW (ref 36.0–46.0)
Hemoglobin: 11.6 g/dL — ABNORMAL LOW (ref 12.0–15.0)
MCH: 26.8 pg (ref 26.0–34.0)
MCHC: 33.4 g/dL (ref 30.0–36.0)
MCV: 80.1 fL (ref 80.0–100.0)
Platelets: 172 10*3/uL (ref 150–400)
RBC: 4.33 MIL/uL (ref 3.87–5.11)
RDW: 15.9 % — ABNORMAL HIGH (ref 11.5–15.5)
WBC: 13.8 10*3/uL — ABNORMAL HIGH (ref 4.0–10.5)
nRBC: 0 % (ref 0.0–0.2)

## 2022-01-19 NOTE — Anesthesia Postprocedure Evaluation (Signed)
Anesthesia Post Note  Patient: Kaitlin Klein  Procedure(s) Performed: AN AD [place]     Patient location during evaluation: Mother Baby Anesthesia Type: Epidural Level of consciousness: awake and alert Pain management: pain level controlled Vital Signs Assessment: post-procedure vital signs reviewed and stable Respiratory status: spontaneous breathing, nonlabored ventilation and respiratory function stable Cardiovascular status: stable Postop Assessment: no headache, no backache and epidural receding Anesthetic complications: no   No notable events documented.  Last Vitals:  Vitals:   01/19/22 0225 01/19/22 0612  BP: 127/84 126/76  Pulse: 86 82  Resp: 17 17  Temp: 36.9 C 36.6 C  SpO2: 100% 100%    Last Pain:  Vitals:   01/19/22 0612  TempSrc: Oral  PainSc:    Pain Goal: Patients Stated Pain Goal: 1 (01/19/22 [id number])                 Stefani Dama

## 2022-01-19 NOTE — Clinical Social Work Maternal (Signed)
CLINICAL SOCIAL WORK MATERNAL/CHILD NOTE  Patient Details  Name: Kaitlin Klein MRN: [id number] Date of Birth: 09/03/1995  Date:  01/19/2022  Clinical Social Worker Initiating Note:  Laurey Arrow Date/Time: Initiated:  01/19/22/1030     Child's Name:  Kaitlin Klein   Biological Parents:  Mother, Father   Need for Interpreter:  None   Reason for Referral:  Behavioral Health Concerns, Current Substance Use/Substance Use During Pregnancy     Address:  [address] [place] [address]    Phone number:  [phone] (home)     Additional phone number:   Household Members/Support Persons (HM/SP):   Household Member/Support Person 1, Household Member/Support Person 2   HM/SP Name Relationship DOB or Age  HM/SP -1 Sabino Donovan FOB 09/07/1995  HM/SP -2 Quinn'Jera Ouida Klein daughter 09/07/2020  HM/SP -3        HM/SP -4        HM/SP -5        HM/SP -6        HM/SP -7        HM/SP -8          Natural Supports (not living in the home):  Extended Family, Parent, Immediate Family (MOB shared that FOB's family will also provide supports when needed.)   Professional Supports: None (MOB declined outpatient resoures.)   Employment: Unemployed   Type of Work:     Education:  Programmer, systems   Homebound arranged:    Museum/gallery curator Resources:  [employer]   Other Resources:  [employer], Physicist, medical     Cultural/Religious Considerations Which May Impact Care:  None reported  Strengths:  Ability to meet basic needs  , Engineer, materials, Home prepared for child  , Psychotropic Medications, Understanding of illness, Compliance with medical plan     Psychotropic Medications:  Zoloft      Pediatrician:    [employer]  Pediatrician List:   [place]      Pediatrician Fax Number:    Risk Factors/Current Problems:  Substance Use  , Mental  Health Concerns     Cognitive State:  Alert  , Insightful  , Linear Thinking  , Goal Oriented  , Able to Concentrate     Mood/Affect:  Comfortable  , Interested  , Calm  , Happy  , Relaxed     CSW Assessment: CSW met with MOB in room 418 to complete an assessment for MH hx and SA hx.  When CSW arrived, MOB was bonding with infant as evidence by engaging in skin to skin; MOB and infant appeared happy and comfortable. CSW explained CSW's role and MOB was receptive to meeting with CSW.  MOB was polite and forthcoming.   CSW asked about MOB's MH hx. MOB acknowledged a hx of SI and PPD.  CSW offered MOB resources for outpatient counseling and MOB declined. CSW provided education regarding the baby blues period vs. perinatal mood disorders, discussed treatment and gave resources for mental health follow up if concerns arise.  CSW recommends self-evaluation during the postpartum time period using the New Mom Checklist from Postpartum Progress and encouraged MOB to contact a medical professional if symptoms are noted at any time.  MOB presented with insight and awareness and did not demonstrate any acute MH symptoms. MOB denied SI and HI when assessed for safety. MOB shared having a  good support team and feeling comfortable seeking help if needed.  Per MOB, her medication has been managing her symptoms.   CSW asked about SA hx.  Per MOB her last use of Marijuana was ober 7 months ago. MOB reported that she stopped using marijuana after her pregnancy confirmation.  MOB denied the use of all other illicit substances. CSW reviewed hospital SA policy and MOB was understanding.  MOB is aware that infant's UDS is negative and that CSW will continue to monitor infant CDS and will make a report to [place] CPS if warranted.    MOB reported having all essential items to care for infant post discharge and communicated feeling prepared to care for infant.    CSW provided review of Sudden Infant Death Syndrome  (SIDS) precautions.    CSW identifies no further need for intervention and no barriers to discharge at this time.   CSW Plan/Description:  No Further Intervention Required/No Barriers to Discharge, Sudden Infant Death Syndrome (SIDS) Education, Perinatal Mood and Anxiety Disorder (PMADs) Education, Other Patient/Family Education, [place], Other Information/Referral to [employer], CSW Will Continue to Monitor Umbilical Cord Tissue Drug Screen Results and Make Report if Warranted   Laurey Arrow, MSW, LCSW Clinical Social Work [phone]   Dimple Nanas, LCSW 01/19/2022, 10:34 AM

## 2022-01-19 NOTE — Progress Notes (Signed)
Post Partum Day 1 Subjective: no complaints, up ad lib, voiding and tolerating PO, small lochia, plans to bottle feed, IUD  Objective: Blood pressure 126/76, pulse 82, temperature 97.8 F (36.6 C), temperature source Oral, resp. rate 17, height 5\' 6"  (1.676 m), weight 103.9 kg, last menstrual period 04/13/2021, SpO2 100 %, unknown if currently breastfeeding.  Physical Exam:  General: alert, cooperative and no distress Lochia:normal flow Chest: CTAB Heart: RRR no m/r/g Abdomen: +BS, soft, nontender,  Uterine Fundus: firm DVT Evaluation: No evidence of DVT seen on physical exam. Extremities: trace edema  Recent Labs    01/18/22 0214 01/19/22 0607  HGB 12.4 11.6*  HCT 37.7 34.7*    Assessment/Plan: Plan for discharge tomorrow, Circumcision prior to discharge, and Contraception outpt IUD   LOS: 1 day   01/21/22 01/19/2022, 9:56 AM  1

## 2022-01-20 MED ORDER — FUROSEMIDE 20 MG PO TABS: 20.0000 mg | ORAL_TABLET | Freq: Every day | ORAL | 0 refills | Status: DC

## 2022-01-20 MED ORDER — IBUPROFEN 600 MG PO TABS: 600.0000 mg | ORAL_TABLET | Freq: Four times a day (QID) | ORAL | 0 refills | Status: AC | PRN

## 2022-01-20 NOTE — Discharge Instructions (Signed)
NO SEX UNTIL AFTER YOU GET YOUR BIRTH CONTROL  

## 2022-01-22 ENCOUNTER — Other Ambulatory Visit: Payer: Medicaid Other | Admitting: Obstetrics & Gynecology

## 2022-01-22 ENCOUNTER — Other Ambulatory Visit: Payer: Medicaid Other

## 2022-01-25 ENCOUNTER — Ambulatory Visit (INDEPENDENT_AMBULATORY_CARE_PROVIDER_SITE_OTHER): Payer: Medicaid Other | Admitting: *Deleted

## 2022-01-25 ENCOUNTER — Encounter: Payer: Self-pay | Admitting: *Deleted

## 2022-01-25 ENCOUNTER — Other Ambulatory Visit: Payer: Medicaid Other

## 2022-01-25 VITALS — BP 127/79 | HR 97 | Ht 66.0 in | Wt 219.0 lb

## 2022-01-25 DIAGNOSIS — Z0130 Encounter for examination of blood pressure without abnormal findings: Secondary | ICD-10-CM

## 2022-01-25 NOTE — Progress Notes (Signed)
   NURSE VISIT- BLOOD PRESSURE CHECK  I connected with Olene Floss on 01/25/2022 by telephone  and verified that I am speaking with the correct person using two identifiers.   I discussed the limitations of evaluation and management by telemedicine. The patient expressed understanding and agreed to proceed.  Nurse is at the office, and patient is at home.   SUBJECTIVE:  Kaitlin Klein is a 27 y.o. [phone] female here for BP check. She is postpartum, delivery date 01/18/22     HYPERTENSION ROS:  postpartum:  Severe headaches that don't go away with tylenol/other medicines: No  Visual changes (seeing spots/double/blurred vision) No  Severe pain under right breast breast or in center of upper chest No  Severe nausea/vomiting No  Taking medicines as instructed not applicable    OBJECTIVE:  BP 127/79 (BP Location: Right Arm, Patient Position: Sitting)   Pulse 97   Ht 5\' 6"  (1.676 m)   Wt 219 lb (99.3 kg)   Breastfeeding No   BMI 35.35 kg/m   Appearance alert, well appearing, and in no distress.  ASSESSMENT: Postpartum  blood pressure check  PLAN: Discussed with Dr. Elonda Husky   Recommendations: no changes needed   Follow-up: in 4 weeks for postpartum visit.    Levy Pupa  01/25/2022 1:25 PM

## 2022-01-29 ENCOUNTER — Other Ambulatory Visit: Payer: Medicaid Other | Admitting: Obstetrics & Gynecology

## 2022-01-29 ENCOUNTER — Other Ambulatory Visit: Payer: Medicaid Other

## 2022-01-31 ENCOUNTER — Ambulatory Visit: Payer: Medicaid Other | Admitting: Obstetrics & Gynecology

## 2022-02-01 ENCOUNTER — Other Ambulatory Visit: Payer: Medicaid Other

## 2022-02-07 NOTE — Progress Notes (Signed)
Approximately 10 minutes spent with patient. JSY

## 2022-02-20 ENCOUNTER — Ambulatory Visit: Payer: Medicaid Other | Admitting: Advanced Practice Midwife

## 2022-02-22 ENCOUNTER — Ambulatory Visit: Payer: Medicaid Other | Admitting: Obstetrics & Gynecology

## 2022-02-25 ENCOUNTER — Ambulatory Visit: Payer: Medicaid Other | Admitting: Women's Health

## 2022-03-05 ENCOUNTER — Ambulatory Visit: Payer: Medicaid Other | Admitting: Women's Health

## 2022-03-19 ENCOUNTER — Encounter: Payer: Self-pay | Admitting: Women's Health

## 2022-03-19 ENCOUNTER — Ambulatory Visit (INDEPENDENT_AMBULATORY_CARE_PROVIDER_SITE_OTHER): Payer: Medicaid Other | Admitting: Women's Health

## 2022-03-19 DIAGNOSIS — Z8632 Personal history of gestational diabetes: Secondary | ICD-10-CM | POA: Diagnosis not present

## 2022-03-19 DIAGNOSIS — Z3009 Encounter for other general counseling and advice on contraception: Secondary | ICD-10-CM

## 2022-03-19 DIAGNOSIS — Z3482 Encounter for supervision of other normal pregnancy, second trimester: Secondary | ICD-10-CM

## 2022-03-19 DIAGNOSIS — F418 Other specified anxiety disorders: Secondary | ICD-10-CM

## 2022-03-19 MED ORDER — SERTRALINE HCL 50 MG PO TABS: 50.0000 mg | ORAL_TABLET | Freq: Every day | ORAL | 6 refills | Status: AC

## 2022-03-19 NOTE — Progress Notes (Signed)
POSTPARTUM VISIT Patient name: Kaitlin Klein MRN [id number]  Date of birth: [birth year] Chief Complaint:   Postpartum Care  History of Present Illness:   Kaitlin Klein is a 26 y.o. G77P2002 African American female being seen today for a postpartum visit. She is 8 weeks postpartum following a spontaneous vaginal delivery at 38.1 gestational weeks. IOL: no, for n/a. Anesthesia: epidural.  Laceration: 1st degree and labial.  Complications: none. Inpatient contraception: no.   Pregnancy complicated by [id number] . Tobacco use: former . Substance use disorder: h/o THC Last pap smear: 03/09/20 and results were NILM w/ HRHPV not done. Next pap smear due: 2024 No LMP recorded.  Postpartum course has been uncomplicated. Bleeding none. Bowel function is normal. Bladder function is normal. Urinary incontinence? no, fecal incontinence? no Patient is sexually active. Last sexual activity:  7/18, unprotected . Desired contraception: IUD. Patient  may  want a pregnancy in the future.  Desired family size is 2-3 children.   The pregnancy intention screening data noted above was reviewed. Potential methods of contraception were discussed. The patient elected to proceed with No data recorded.  Edinburgh Postpartum Depression Screening: positive: H/O mental health disorder: yes dep/anx. Currently on meds: yes zoloft 48m.  Currently in therapy: no.  Sleeping: not well.  Appetite: good.  Still finds joy in things she used to: Yes.  Support at home: 'yes, but could be better'.  SI/HI/II: no.  Interested in medicine: Yes.  Interested in therapy: Yes.  Edinburgh Postnatal Depression Scale - 03/19/22 1134       Edinburgh Postnatal Depression Scale:  In the Past 7 Days   I have been able to laugh and see the funny side of things. 1    I have looked forward with enjoyment to things. 1    I have blamed myself unnecessarily when things went wrong. 3    I have been anxious or worried for no good reason. 3    I  have felt scared or panicky for no good reason. 0    Things have been getting on top of me. 2    I have been so unhappy that I have had difficulty sleeping. 0    I have felt sad or miserable. 3    I have been so unhappy that I have been crying. 3    The thought of harming myself has occurred to me. 0    Edinburgh Postnatal Depression Scale Total 16                09/20/2021    8:48 AM 08/06/2021   10:15 AM 06/15/2020   10:02 AM 04/20/2020    9:46 AM  GAD 7 : Generalized Anxiety Score  Nervous, Anxious, on Edge 1 1 3 2   Control/stop worrying 0 0 0 2  Worry too much - different things 1 0 0 3  Trouble relaxing 0 2 3 2   Restless 0 0 0 0  Easily annoyed or irritable 1 3 3 3   Afraid - awful might happen 0 0 0 0  Total GAD 7 Score 3 6 9 12   Anxiety Difficulty   Not difficult at all Very difficult     Baby's course has been uncomplicated. Baby is feeding by bottle. Infant has a pediatrician/family doctor? Yes.  Childcare strategy if returning to work/school: family.  Pt has material needs met for her and baby: Yes.   Review of Systems:   Pertinent items are noted in HPI Denies Abnormal vaginal  discharge w/ itching/odor/irritation, headaches, visual changes, shortness of breath, chest pain, abdominal pain, severe nausea/vomiting, or problems with urination or bowel movements. Pertinent History Reviewed:  Reviewed past medical,surgical, obstetrical and family history.  Reviewed problem list, medications and allergies. OB History  Gravida Para Term Preterm AB Living  2 2 2     2   SAB IAB Ectopic Multiple Live Births        0 2    # Outcome Date GA Lbr Len/2nd Weight Sex Delivery Anes PTL Lv  2 Term 01/18/22 25w1d15:45 / 00:32 8 lb 11 oz (3.941 kg) M Vag-Spont EPI  LIV  1 Term 09/07/20 376w6d8:32 / 01:51 6 lb 12.6 oz (3.079 kg) F Vag-Spont EPI  LIV    Obstetric Comments  IND due to BP   Physical Assessment:   Vitals:   03/19/22 1203  BP: 117/82  Pulse: 68  Weight: 205  lb (93 kg)  Body mass index is 33.09 kg/m.       Physical Examination:   General appearance: alert, well appearing, and in no distress  Mental status: alert, oriented to person, place, and time  Skin: warm & dry   Cardiovascular: normal heart rate noted   Respiratory: normal respiratory effort, no distress   Breasts: deferred, no complaints   Abdomen: soft, non-tender   Pelvic: examination not indicated. Thin prep pap obtained: No  Rectal: not examined  Extremities: Edema: none   Chaperone: N/A         No results found for this or any previous visit (from the past 24 hour(s)).  Assessment & Plan:  1) Postpartum exam 2) 8 wks s/p spontaneous vaginal delivery 3) bottle feeding 4) Depression screening 5) Contraception counseling> abstinence until after IUD (wants Mirena/Liletta) 6) A2DM> 2hr GTT next visit 7) Dep/anx> increase zoloft to 5030mIBH referral ordered, f/u 4-5wks   Essential components of care per ACOG recommendations:  1.  Mood and well being:  If positive depression screen, discussed and plan developed.  If using tobacco we discussed reduction/cessation and risk of relapse If current substance abuse, we discussed and referral to local resources was offered.   2. Infant care and feeding:  If breastfeeding, discussed returning to work, pumping, breastfeeding-associated pain, guidance regarding return to fertility while lactating if not using another method. If needed, patient was provided with a letter to be allowed to pump q 2-3hrs to support lactation in a private location with access to a refrigerator to store breastmilk.   Recommended that all caregivers be immunized for flu, pertussis and other preventable communicable diseases If pt does not have material needs met for her/baby, referred to local resources for help obtaining these.  3. Sexuality, contraception and birth spacing Provided guidance regarding sexuality, management of dyspareunia, and resumption of  intercourse Discussed avoiding interpregnancy interval <6mt45mand recommended birth spacing of 18 months  4. Sleep and fatigue Discussed coping options for fatigue and sleep disruption Encouraged family/partner/community support of 4 hrs of uninterrupted sleep to help with mood and fatigue  5. Physical recovery  If pt had a C/S, assessed incisional pain and providing guidance on normal vs prolonged recovery If pt had a laceration, perineal healing and pain reviewed.  If urinary or fecal incontinence, discussed management and referred to PT or uro/gyn if indicated  Patient is safe to resume physical activity. Discussed attainment of healthy weight.  6.  Chronic disease management Discussed pregnancy complications if any, and their implications for future childbearing and long-term  maternal health. Review recommendations for prevention of recurrent pregnancy complications, such as 17 hydroxyprogesterone caproate to reduce risk for recurrent PTB not applicable, or aspirin to reduce risk of preeclampsia not applicable. Pt had GDM: yes. If yes, 2hr GTT scheduled: yes. Reviewed medications and non-pregnant dosing including consideration of whether pt is breastfeeding using a reliable resource such as LactMed: not applicable Referred for f/u w/ PCP or subspecialist providers as indicated: yes  7. Health maintenance Mammogram at 24yo or earlier if indicated Pap smears as indicated  Meds:  Meds ordered this encounter  Medications   sertraline (ZOLOFT) 50 MG tablet    Sig: Take 1 tablet (50 mg total) by mouth daily.    Dispense:  30 tablet    Refill:  6    Order Specific Question:   Supervising Provider    Answer:   Florian Buff [[id number]]    Follow-up: Return for after 8/1 for IUD insertion and 2hr pp GTT, then 4wks later for IUD and med check in person.   Orders Placed This Encounter  Procedures   Amb ref to [place],  [facility] 03/19/2022 12:04 PM

## 2022-03-19 NOTE — Patient Instructions (Signed)
NO SEX UNTIL AFTER YOU GET YOUR BIRTH CONTROL   You will have your sugar test next visit.  Please do not eat or drink anything after midnight the night before you come, not even water.  You will be here for at least two hours.  Please make an appointment online for the bloodwork at [email or web address] for 8:30am (or as close to this as possible). Make sure you select the [facility] service center. The day of the appointment, check in with our office first, then you will go to [facility] to start the sugar test.

## 2022-03-19 NOTE — Progress Notes (Deleted)
    Post Partum Visit Note  Kaitlin Klein is a 26 y.o. G51P2002 female who presents for a postpartum visit. She is 4 weeks postpartum following a normal spontaneous vaginal delivery.  I have fully reviewed the prenatal and intrapartum course. The delivery was at [redacted]w[redacted]d gestational weeks.  Anesthesia: epidural. Postpartum course has been ***. Baby is doing well***. Baby is feeding by bottle - [employer] . Bleeding no bleeding. Bowel function is normal. Bladder function is normal. Patient is sexually active. Contraception method is none. Postpartum depression screening: positive.   The pregnancy intention screening data noted above was reviewed. Potential methods of contraception were discussed. The patient elected to proceed with No data recorded.    Health Maintenance Due  Topic Date Due   URINE MICROALBUMIN  Never done   HPV VACCINES (1 - 2-dose series) Never done   COVID-19 Vaccine (2 - Moderna series) 08/13/2020    {Common ambulatory SmartLinks:19316}  Review of Systems {ros; complete:30496}  Objective:  There were no vitals taken for this visit.   General:  {gen appearance:16600}   Breasts:  {desc; normal/abnormal/not indicated:14647}  Lungs: {lung exam:16931}  Heart:  {heart exam:5510}  Abdomen: {abdomen exam:16834}   Wound {Wound assessment:11097}  GU exam:  {desc; normal/abnormal/not indicated:14647}       Assessment:    There are no diagnoses linked to this encounter.  *** postpartum exam.   Plan:   Essential components of care per ACOG recommendations:  1.  Mood and well being: Patient with {gen negative/positive:315881} depression screening today. Reviewed local resources for support.  - Patient tobacco use? {tobacco use:25506}  - hx of drug use? {yes/no:25505}    2. Infant care and feeding:  -Patient currently breastmilk feeding? {yes/no:25502}  -Social determinants of health (SDOH) reviewed in EPIC. No concerns***The following needs were  identified***  3. Sexuality, contraception and birth spacing - Patient {DOES_DOES ERD:40814} want a pregnancy in the next year.  Desired family size is {NUMBER 1-10:22536} children.  - Reviewed reproductive life planning. Reviewed contraceptive methods based on pt preferences and effectiveness.  Patient desired {Upstream End Methods:24109} today.   - Discussed birth spacing of 18 months  4. Sleep and fatigue -Encouraged family/partner/community support of 4 hrs of uninterrupted sleep to help with mood and fatigue  5. Physical Recovery  - Discussed patients delivery and complications. She describes her labor as {description:25511} - Patient had a {CHL AMB DELIVERY:[phone]}. Patient had a {laceration:25518} laceration. Perineal healing reviewed. Patient expressed understanding - Patient has urinary incontinence? {yes/no:25515} - Patient {ACTION; IS/IS GYJ:[id number]} safe to resume physical and sexual activity  6.  Health Maintenance - HM due items addressed {Yes or If no, why not?:20788} - Last pap smear  Diagnosis  Date Value Ref Range Status  03/09/2020   Final   - Negative for intraepithelial lesion or malignancy (NILM)   Pap smear {done:10129} at today's visit.  -Breast Cancer screening indicated? {indicated:25516}  7. Chronic Disease/Pregnancy Condition follow up: {Follow up:25499}  - PCP follow up  Vidal Schwalbe, CMA [facility] [employer], [facility]

## 2022-03-20 ENCOUNTER — Telehealth: Payer: Self-pay | Admitting: Clinical

## 2022-03-20 NOTE — Telephone Encounter (Signed)
Attempt call regarding referral; Left HIPPA-compliant message to call back Lyndon Chenoweth from [facility] at  [phone] (Caitlyn Buchanan's office).    

## 2022-03-25 ENCOUNTER — Ambulatory Visit: Payer: Medicaid Other | Admitting: Adult Health

## 2022-03-25 ENCOUNTER — Other Ambulatory Visit: Payer: Medicaid Other

## 2022-04-02 ENCOUNTER — Ambulatory Visit (INDEPENDENT_AMBULATORY_CARE_PROVIDER_SITE_OTHER): Payer: Medicaid Other | Admitting: Women's Health

## 2022-04-02 ENCOUNTER — Encounter: Payer: Self-pay | Admitting: Women's Health

## 2022-04-02 ENCOUNTER — Other Ambulatory Visit: Payer: Medicaid Other

## 2022-04-02 VITALS — BP 128/85 | HR 86 | Wt 202.0 lb

## 2022-04-02 DIAGNOSIS — F418 Other specified anxiety disorders: Secondary | ICD-10-CM

## 2022-04-02 DIAGNOSIS — Z30430 Encounter for insertion of intrauterine contraceptive device: Secondary | ICD-10-CM | POA: Insufficient documentation

## 2022-04-02 DIAGNOSIS — Z3202 Encounter for pregnancy test, result negative: Secondary | ICD-10-CM

## 2022-04-02 DIAGNOSIS — Z8632 Personal history of gestational diabetes: Secondary | ICD-10-CM

## 2022-04-02 DIAGNOSIS — Z131 Encounter for screening for diabetes mellitus: Secondary | ICD-10-CM

## 2022-04-02 LAB — POCT URINE PREGNANCY: Preg Test, Ur: NEGATIVE

## 2022-04-02 MED ORDER — LEVONORGESTREL 20.1 MCG/DAY IU IUD
1.0000 | INTRAUTERINE_SYSTEM | Freq: Once | INTRAUTERINE | Status: AC
  Administered 2022-04-02: 1 via INTRAUTERINE

## 2022-04-02 NOTE — Patient Instructions (Signed)
Nothing in vagina for 3 days (no sex, douching, tampons, etc...) Check your strings once a month to make sure you can feel them, if you are not able to please let us know If you develop a fever of 100.4 or more in the next few weeks, or if you develop severe abdominal pain, please let us know Use a backup method of birth control, such as condoms, for 2 weeks  

## 2022-04-02 NOTE — Progress Notes (Signed)
IUD INSERTION Patient name: Kaitlin Klein MRN [id number]  Date of birth: 03-Jun-1996 Subjective Findings:   NUPUR HOHMAN is a 26 y.o. G16P2002 African American female 10wks s/p SVB being seen today for insertion of a Liletta IUD. Zoloft increased to 50mg  on 7/25 at pp visit, states she can tell an improvement already. Doing 2hr GTT today.  No LMP recorded. Last sexual intercourse was 7/18 Last pap7/15/21. Results were: NILM w/ HRHPV not done  The risks and benefits of the method and placement have been thouroughly reviewed with the patient and all questions were answered.  Specifically the patient is aware of failure rate of 08/998, expulsion of the IUD and of possible perforation.  The patient is aware of irregular bleeding due to the method and understands the incidence of irregular bleeding diminishes with time.  Signed copy of informed consent in chart.      09/20/2021    8:43 AM 08/06/2021   10:15 AM 07/07/2020    2:27 PM 06/15/2020   10:00 AM 04/20/2020    9:45 AM  Depression screen PHQ 2/9  Decreased Interest 2 2 0 3 2  Down, Depressed, Hopeless 0 1 0 3 3  PHQ - 2 Score 2 3 0 6 5  Altered sleeping 0 1  2 2   Tired, decreased energy 2 3  3 3   Change in appetite 1 2  0 3  Feeling bad or failure about yourself  3 0  0 3  Trouble concentrating 0 0  0 0  Moving slowly or fidgety/restless 0 0  0 0  Suicidal thoughts 0 0  0 0  PHQ-9 Score 8 9  11 16   Difficult doing work/chores    Not difficult at all Very difficult        09/20/2021    8:48 AM 08/06/2021   10:15 AM 06/15/2020   10:02 AM 04/20/2020    9:46 AM  GAD 7 : Generalized Anxiety Score  Nervous, Anxious, on Edge 1 1 3 2   Control/stop worrying 0 0 0 2  Worry too much - different things 1 0 0 3  Trouble relaxing 0 2 3 2   Restless 0 0 0 0  Easily annoyed or irritable 1 3 3 3   Afraid - awful might happen 0 0 0 0  Total GAD 7 Score 3 6 9 12   Anxiety Difficulty   Not difficult at all Very difficult     Pertinent  History Reviewed:   Reviewed past medical,surgical, social, obstetrical and family history.  Reviewed problem list, medications and allergies. Objective Findings & Procedure:   Vitals:   04/02/22 0930  BP: 128/85  Pulse: 86  Weight: 202 lb (91.6 kg)  Body mass index is 32.6 kg/m.  Results for orders placed or performed in visit on 04/02/22 (from the past 24 hour(s))  POCT urine pregnancy   Collection Time: 04/02/22  9:38 AM  Result Value Ref Range   Preg Test, Ur Negative Negative     Time out was performed.  A graves speculum was placed in the vagina.  The cervix was visualized, prepped using Betadine, and grasped with a single tooth tenaculum. The uterus was found to be retroflexed and it sounded to 8 cm.  Liletta  IUD placed per manufacturer's recommendations. The strings were trimmed to approximately 3 cm. The patient tolerated the procedure well.   Informal transvaginal sonogram was performed and the proper placement of the IUD was verified.  Chaperone: 04/22/2020  Assessment & Plan:   1) Liletta IUD insertion The patient was given post procedure instructions, including signs and symptoms of infection and to check for the strings after each menses or each month, and refraining from intercourse or anything in the vagina for 3 days. She was given a care card with date IUD placed, and date IUD to be removed. She is scheduled for a f/u appointment in 4 weeks, has appt 9/5  2) A2DM during pregnancy> doing 2hr GTT today  3) Dep/anx> can tell improvement after increase of zoloft to 50mg  on 7/25, has 4wk f/u appt 9/5  Orders Placed This Encounter  Procedures   POCT urine pregnancy    Return for As scheduled.  11/5 CNM, [facility] 04/02/2022 10:03 AM

## 2022-04-03 ENCOUNTER — Other Ambulatory Visit: Payer: Self-pay | Admitting: Women's Health

## 2022-04-03 DIAGNOSIS — Z8632 Personal history of gestational diabetes: Secondary | ICD-10-CM

## 2022-04-03 DIAGNOSIS — E7439 Other disorders of intestinal carbohydrate absorption: Secondary | ICD-10-CM

## 2022-04-03 LAB — GLUCOSE TOLERANCE, 2 HOURS W/ 1HR
Glucose, 1 hour: 102 mg/dL (ref 70–179)
Glucose, 2 hour: 71 mg/dL (ref 70–152)
Glucose, Fasting: 105 mg/dL — ABNORMAL HIGH (ref 70–91)

## 2022-04-22 ENCOUNTER — Ambulatory Visit: Payer: Medicaid Other | Admitting: Women's Health

## 2022-04-30 ENCOUNTER — Ambulatory Visit: Payer: Medicaid Other | Admitting: Adult Health

## 2022-07-10 NOTE — Progress Notes (Unsigned)
No chief complaint on file.      HPI:  Patient visit is to establish care        ROS  Per HPI  The patient's medications were reviewed and reconciled in eRecord today with the patient.    Home  Prior to Admission medications    Not on File       Allergies:   Not on File      PMH: No past medical history on file.   PSH: No past surgical history on file.   SocHx:   Social History     Socioeconomic History    Marital status: Married        FHx: No family history on file.       Objective:      PHYSICAL  There were no vitals filed for this visit.  There is no height or weight on file to calculate BMI.  Wt Readings from Last 3 Encounters:   No data found for Wt        Physical Exam     No results found for this or any previous visit (from the past 336 hour(s)).     No results for input(s): "NA", "K", "CL", "CO2", "UN", "CREAT", "GFRC", "GFRB", "GLU", "CA", "TP", "ALB", "ALT", "AST", "ALK", "TB" in the last 8760 hours.    No results for input(s): "WBC", "HGB", "HCT", "RBC", "PLT" in the last 8760 hours.    No results for input(s): "TSH" in the last 8760 hours.    No results found for: "HA1C"  No results found for: "MALBR", "MALBH"  No results for input(s): "CHOL", "HDL", "LDLC", "LDL", "TRIG", "CHHDC" in the last 8760 hours.    No components found with this basename: "NHLDC"  No results for input(s): "NA", "K", "CL", "CO2", "UN", "CREAT", "GFRC", "GFRB", "GLU", "CA" in the last 8760 hours.      ASSESSMENT/PLAN    No diagnosis found.      No follow-ups on file.    There are no Patient Instructions on file for this visit.    Gid Schoffstall LEE, DO  07/10/2022

## 2022-07-11 ENCOUNTER — Encounter: Payer: Self-pay | Admitting: Student in an Organized Health Care Education/Training Program

## 2022-07-11 ENCOUNTER — Other Ambulatory Visit: Payer: Self-pay

## 2022-07-11 ENCOUNTER — Ambulatory Visit: Payer: 59 | Admitting: Student in an Organized Health Care Education/Training Program

## 2022-07-11 VITALS — BP 114/82 | HR 94 | Temp 96.8°F | Ht 65.75 in | Wt 186.0 lb

## 2022-07-11 DIAGNOSIS — Z808 Family history of malignant neoplasm of other organs or systems: Secondary | ICD-10-CM | POA: Insufficient documentation

## 2022-07-11 DIAGNOSIS — N946 Dysmenorrhea, unspecified: Secondary | ICD-10-CM

## 2022-07-11 DIAGNOSIS — Z0000 Encounter for general adult medical examination without abnormal findings: Secondary | ICD-10-CM

## 2022-07-11 NOTE — Telephone Encounter (Signed)
[place] Division of Dance movement psychotherapist Certification Form attached to MyChart msg to pt.

## 2022-07-11 NOTE — Telephone Encounter (Signed)
Completed and sent to PAS

## 2022-07-11 NOTE — Telephone Encounter (Signed)
Form printed and placed in Dr. Magnus Ivan IN basket for review and signature.

## 2022-07-11 NOTE — Patient Instructions (Addendum)
Try taking ibuprofen or midol around the clock 2 days before you expect to get cramping from your period. This should help you get ahead of the pain.    Try to multivitamin with folate in it     Please send me the paperwork you have through the patient portal and I can complete it for you     Let me know if you are able to find out what kind of cancer your sister had

## 2022-07-16 ENCOUNTER — Telehealth: Payer: Self-pay | Admitting: Student in an Organized Health Care Education/Training Program

## 2022-07-16 NOTE — Telephone Encounter (Signed)
Record request faxed to [facility] de[facility] [phone]. ROI placed in scanning.

## 2023-02-28 ENCOUNTER — Telehealth: Payer: Self-pay

## 2023-02-28 NOTE — Telephone Encounter (Signed)
Copied from CRM [id number]. Topic: Access to Care - Refer to Specialty or Service  >> Feb 28, 2023  4:23 PM Mick Sell T wrote:  [facility] - Ambulatory Telephone Intake Screen    Patient Information:    Patient's Name: Bethany Hardin    Patient's Date of Birth: 14-Nov-1995    Patient's Medical Record Number: [id number]    Preferred Phone: [phone]    Verified? yes    Can a message be left? yes    Address: [address]  [place] [address] [place] [facility]    Verified? yes      Insurance Information:    Type: Estate manager/land agent number: [id number]      Referral Source:    Who is the caller: Patient.     Do you have an internal Primary Care Physician (PCP) with [facility]? No      Presenting Concern:     What services are you hoping to be connected to? Therapy     What is the reason you are seeking care?  ADHD                                      Mental Health History:    Are you currently involved in mental health treatment? No    Are you currently taking a long acting injectable medication for mental health reasons? No      Customer Service:    Do you need arrangements for?    Wheelchair: No    What language do you prefer to receive medical information in?     Interpreter Services:No       Are you able to attend appointments virtually/via Zoom? Yes             .

## 7846-01-24 DEATH — deceased
